# Patient Record
Sex: Female | Born: 1935
Health system: Southern US, Community
[De-identification: ages and names within clinical notes are randomized; demographics above are authoritative.]

## PROBLEM LIST (undated history)

## (undated) DIAGNOSIS — I493 Ventricular premature depolarization: Secondary | ICD-10-CM

## (undated) DIAGNOSIS — M1990 Unspecified osteoarthritis, unspecified site: Secondary | ICD-10-CM

## (undated) DIAGNOSIS — F329 Major depressive disorder, single episode, unspecified: Secondary | ICD-10-CM

## (undated) DIAGNOSIS — T7840XA Allergy, unspecified, initial encounter: Secondary | ICD-10-CM

## (undated) DIAGNOSIS — K219 Gastro-esophageal reflux disease without esophagitis: Secondary | ICD-10-CM

## (undated) DIAGNOSIS — Z972 Presence of dental prosthetic device (complete) (partial): Secondary | ICD-10-CM

## (undated) DIAGNOSIS — F419 Anxiety disorder, unspecified: Secondary | ICD-10-CM

## (undated) DIAGNOSIS — F32A Depression, unspecified: Secondary | ICD-10-CM

## (undated) DIAGNOSIS — S32000A Wedge compression fracture of unspecified lumbar vertebra, initial encounter for closed fracture: Secondary | ICD-10-CM

## (undated) HISTORY — DX: Depression, unspecified: F32.A

## (undated) HISTORY — DX: Gastro-esophageal reflux disease without esophagitis: K21.9

## (undated) HISTORY — DX: Anxiety disorder, unspecified: F41.9

## (undated) HISTORY — DX: Allergy, unspecified, initial encounter: T78.40XA

## (undated) HISTORY — DX: Unspecified osteoarthritis, unspecified site: M19.90

## (undated) HISTORY — DX: Major depressive disorder, single episode, unspecified: F32.9

## (undated) HISTORY — PX: KNEE ARTHROSCOPY: SUR90

## (undated) HISTORY — PX: HEMORRHOID SURGERY: SHX153

---

## 2004-02-16 ENCOUNTER — Encounter: Payer: Self-pay | Admitting: Internal Medicine

## 2004-11-16 LAB — FECAL OCCULT BLOOD, GUAIAC: Fecal Occult Blood: NEGATIVE

## 2004-11-29 ENCOUNTER — Ambulatory Visit: Payer: Self-pay | Admitting: Internal Medicine

## 2005-03-03 ENCOUNTER — Encounter: Payer: Self-pay | Admitting: Internal Medicine

## 2005-12-05 ENCOUNTER — Ambulatory Visit: Payer: Self-pay | Admitting: Internal Medicine

## 2005-12-15 ENCOUNTER — Ambulatory Visit: Payer: Self-pay | Admitting: Internal Medicine

## 2006-03-10 ENCOUNTER — Ambulatory Visit: Payer: Self-pay | Admitting: Internal Medicine

## 2006-03-20 ENCOUNTER — Ambulatory Visit: Payer: Self-pay | Admitting: Internal Medicine

## 2006-03-31 ENCOUNTER — Ambulatory Visit: Payer: Self-pay | Admitting: Internal Medicine

## 2006-04-06 ENCOUNTER — Inpatient Hospital Stay: Payer: Self-pay | Admitting: General Practice

## 2006-04-12 ENCOUNTER — Ambulatory Visit: Payer: Self-pay | Admitting: Internal Medicine

## 2006-04-26 ENCOUNTER — Encounter: Payer: Self-pay | Admitting: General Practice

## 2006-05-01 ENCOUNTER — Ambulatory Visit: Payer: Self-pay | Admitting: Internal Medicine

## 2006-06-16 ENCOUNTER — Ambulatory Visit: Payer: Self-pay | Admitting: Internal Medicine

## 2006-07-11 HISTORY — PX: REPLACEMENT TOTAL KNEE: SUR1224

## 2006-10-24 ENCOUNTER — Encounter: Payer: Self-pay | Admitting: Internal Medicine

## 2006-10-24 DIAGNOSIS — M810 Age-related osteoporosis without current pathological fracture: Secondary | ICD-10-CM

## 2006-10-24 DIAGNOSIS — M150 Primary generalized (osteo)arthritis: Secondary | ICD-10-CM

## 2006-10-24 DIAGNOSIS — K219 Gastro-esophageal reflux disease without esophagitis: Secondary | ICD-10-CM | POA: Insufficient documentation

## 2006-10-24 DIAGNOSIS — G479 Sleep disorder, unspecified: Secondary | ICD-10-CM | POA: Insufficient documentation

## 2006-11-15 ENCOUNTER — Ambulatory Visit: Payer: Self-pay | Admitting: Internal Medicine

## 2006-11-15 DIAGNOSIS — F329 Major depressive disorder, single episode, unspecified: Secondary | ICD-10-CM

## 2006-11-15 DIAGNOSIS — R5383 Other fatigue: Secondary | ICD-10-CM

## 2006-11-15 DIAGNOSIS — R5381 Other malaise: Secondary | ICD-10-CM

## 2006-11-16 ENCOUNTER — Telehealth (INDEPENDENT_AMBULATORY_CARE_PROVIDER_SITE_OTHER): Payer: Self-pay | Admitting: *Deleted

## 2006-11-16 LAB — CONVERTED CEMR LAB
ALT: 14 units/L (ref 0–40)
AST: 21 units/L (ref 0–37)
Albumin: 4.2 g/dL (ref 3.5–5.2)
Alkaline Phosphatase: 71 units/L (ref 39–117)
Basophils Absolute: 0 10*3/uL (ref 0.0–0.1)
CO2: 27 meq/L (ref 19–32)
Chloride: 107 meq/L (ref 96–112)
Creatinine, Ser: 0.7 mg/dL (ref 0.4–1.2)
Eosinophils Absolute: 0.1 10*3/uL (ref 0.0–0.6)
GFR calc non Af Amer: 85 mL/min
Glucose, Bld: 91 mg/dL (ref 70–99)
MCHC: 34.1 g/dL (ref 30.0–36.0)
MCV: 92.8 fL (ref 78.0–100.0)
Platelets: 394 10*3/uL (ref 150–400)
RBC: 4.31 M/uL (ref 3.87–5.11)
RDW: 12.6 % (ref 11.5–14.6)
Sodium: 142 meq/L (ref 135–145)

## 2006-12-12 ENCOUNTER — Encounter (INDEPENDENT_AMBULATORY_CARE_PROVIDER_SITE_OTHER): Payer: Self-pay | Admitting: *Deleted

## 2006-12-13 ENCOUNTER — Ambulatory Visit: Payer: Self-pay | Admitting: Internal Medicine

## 2007-02-09 ENCOUNTER — Ambulatory Visit: Payer: Self-pay | Admitting: Internal Medicine

## 2007-04-16 ENCOUNTER — Telehealth: Payer: Self-pay | Admitting: Internal Medicine

## 2007-05-17 ENCOUNTER — Encounter: Payer: Self-pay | Admitting: Internal Medicine

## 2007-05-17 ENCOUNTER — Ambulatory Visit: Payer: Self-pay | Admitting: Internal Medicine

## 2007-05-18 ENCOUNTER — Encounter (INDEPENDENT_AMBULATORY_CARE_PROVIDER_SITE_OTHER): Payer: Self-pay | Admitting: *Deleted

## 2007-05-18 LAB — HM MAMMOGRAPHY: HM Mammogram: NORMAL

## 2007-06-12 ENCOUNTER — Ambulatory Visit: Payer: Self-pay | Admitting: Internal Medicine

## 2007-07-24 ENCOUNTER — Telehealth (INDEPENDENT_AMBULATORY_CARE_PROVIDER_SITE_OTHER): Payer: Self-pay | Admitting: *Deleted

## 2007-07-31 ENCOUNTER — Telehealth (INDEPENDENT_AMBULATORY_CARE_PROVIDER_SITE_OTHER): Payer: Self-pay | Admitting: *Deleted

## 2007-07-31 ENCOUNTER — Telehealth: Payer: Self-pay | Admitting: Internal Medicine

## 2007-08-06 ENCOUNTER — Ambulatory Visit: Payer: Self-pay | Admitting: Family Medicine

## 2007-08-06 DIAGNOSIS — K5289 Other specified noninfective gastroenteritis and colitis: Secondary | ICD-10-CM | POA: Insufficient documentation

## 2007-10-29 ENCOUNTER — Telehealth (INDEPENDENT_AMBULATORY_CARE_PROVIDER_SITE_OTHER): Payer: Self-pay | Admitting: *Deleted

## 2007-12-13 ENCOUNTER — Telehealth (INDEPENDENT_AMBULATORY_CARE_PROVIDER_SITE_OTHER): Payer: Self-pay | Admitting: *Deleted

## 2007-12-17 ENCOUNTER — Ambulatory Visit: Payer: Self-pay | Admitting: Internal Medicine

## 2008-02-11 ENCOUNTER — Telehealth (INDEPENDENT_AMBULATORY_CARE_PROVIDER_SITE_OTHER): Payer: Self-pay | Admitting: *Deleted

## 2008-03-24 ENCOUNTER — Ambulatory Visit: Payer: Self-pay | Admitting: Internal Medicine

## 2008-03-24 ENCOUNTER — Telehealth (INDEPENDENT_AMBULATORY_CARE_PROVIDER_SITE_OTHER): Payer: Self-pay | Admitting: *Deleted

## 2008-03-24 DIAGNOSIS — J069 Acute upper respiratory infection, unspecified: Secondary | ICD-10-CM | POA: Insufficient documentation

## 2008-03-24 DIAGNOSIS — R413 Other amnesia: Secondary | ICD-10-CM

## 2008-03-25 LAB — CONVERTED CEMR LAB
ALT: 15 units/L (ref 0–35)
AST: 19 units/L (ref 0–37)
Albumin: 4.4 g/dL (ref 3.5–5.2)
Basophils Absolute: 0 10*3/uL (ref 0.0–0.1)
Basophils Relative: 0.7 % (ref 0.0–3.0)
CO2: 30 meq/L (ref 19–32)
Calcium: 9.3 mg/dL (ref 8.4–10.5)
GFR calc non Af Amer: 63 mL/min
HCT: 40.4 % (ref 36.0–46.0)
Hemoglobin: 13.6 g/dL (ref 12.0–15.0)
Lymphocytes Relative: 31.2 % (ref 12.0–46.0)
MCHC: 33.6 g/dL (ref 30.0–36.0)
Monocytes Absolute: 0.7 10*3/uL (ref 0.1–1.0)
Neutro Abs: 4.1 10*3/uL (ref 1.4–7.7)
Phosphorus: 3.9 mg/dL (ref 2.3–4.6)
RBC: 4.24 M/uL (ref 3.87–5.11)
Sodium: 140 meq/L (ref 135–145)
Vitamin B-12: 395 pg/mL (ref 211–911)

## 2008-04-21 ENCOUNTER — Telehealth: Payer: Self-pay | Admitting: Family Medicine

## 2008-05-19 ENCOUNTER — Telehealth (INDEPENDENT_AMBULATORY_CARE_PROVIDER_SITE_OTHER): Payer: Self-pay | Admitting: *Deleted

## 2008-05-22 ENCOUNTER — Telehealth: Payer: Self-pay | Admitting: Internal Medicine

## 2008-05-26 ENCOUNTER — Telehealth (INDEPENDENT_AMBULATORY_CARE_PROVIDER_SITE_OTHER): Payer: Self-pay | Admitting: *Deleted

## 2008-06-18 ENCOUNTER — Ambulatory Visit: Payer: Self-pay | Admitting: Internal Medicine

## 2008-09-01 ENCOUNTER — Telehealth: Payer: Self-pay | Admitting: Internal Medicine

## 2008-09-15 ENCOUNTER — Ambulatory Visit: Payer: Self-pay | Admitting: Internal Medicine

## 2008-09-15 DIAGNOSIS — K4090 Unilateral inguinal hernia, without obstruction or gangrene, not specified as recurrent: Secondary | ICD-10-CM | POA: Insufficient documentation

## 2008-09-15 DIAGNOSIS — J309 Allergic rhinitis, unspecified: Secondary | ICD-10-CM | POA: Insufficient documentation

## 2008-12-24 ENCOUNTER — Ambulatory Visit: Payer: Self-pay | Admitting: Internal Medicine

## 2008-12-29 ENCOUNTER — Telehealth: Payer: Self-pay | Admitting: Internal Medicine

## 2009-01-27 ENCOUNTER — Encounter: Payer: Self-pay | Admitting: Internal Medicine

## 2009-02-08 HISTORY — PX: INGUINAL HERNIA REPAIR: SUR1180

## 2009-02-18 ENCOUNTER — Ambulatory Visit: Payer: Self-pay | Admitting: Surgery

## 2009-02-25 ENCOUNTER — Ambulatory Visit: Payer: Self-pay | Admitting: Surgery

## 2009-03-04 ENCOUNTER — Encounter: Payer: Self-pay | Admitting: Internal Medicine

## 2009-03-18 ENCOUNTER — Ambulatory Visit: Payer: Self-pay | Admitting: Internal Medicine

## 2009-03-18 DIAGNOSIS — N760 Acute vaginitis: Secondary | ICD-10-CM | POA: Insufficient documentation

## 2009-03-18 LAB — CONVERTED CEMR LAB
Protein, U semiquant: NEGATIVE
Urobilinogen, UA: 0.2
WBC Urine, dipstick: NEGATIVE

## 2009-03-31 ENCOUNTER — Telehealth: Payer: Self-pay | Admitting: Internal Medicine

## 2009-04-03 ENCOUNTER — Telehealth: Payer: Self-pay | Admitting: Internal Medicine

## 2009-04-16 ENCOUNTER — Ambulatory Visit: Payer: Self-pay | Admitting: Internal Medicine

## 2009-04-23 ENCOUNTER — Telehealth: Payer: Self-pay | Admitting: Internal Medicine

## 2009-06-08 ENCOUNTER — Telehealth: Payer: Self-pay | Admitting: Internal Medicine

## 2009-08-20 ENCOUNTER — Telehealth: Payer: Self-pay | Admitting: Internal Medicine

## 2009-09-25 ENCOUNTER — Ambulatory Visit: Payer: Self-pay | Admitting: Internal Medicine

## 2009-09-26 LAB — CONVERTED CEMR LAB
BUN: 16 mg/dL (ref 6–23)
Basophils Absolute: 0.1 10*3/uL (ref 0.0–0.1)
CO2: 29 meq/L (ref 19–32)
Calcium: 9.4 mg/dL (ref 8.4–10.5)
Creatinine, Ser: 0.9 mg/dL (ref 0.4–1.2)
Eosinophils Relative: 1.3 % (ref 0.0–5.0)
HCT: 42.7 % (ref 36.0–46.0)
Lymphs Abs: 2.1 10*3/uL (ref 0.7–4.0)
MCV: 96.7 fL (ref 78.0–100.0)
Monocytes Absolute: 0.6 10*3/uL (ref 0.1–1.0)
Platelets: 342 10*3/uL (ref 150.0–400.0)
RDW: 12.9 % (ref 11.5–14.6)
TSH: 1.45 microintl units/mL (ref 0.35–5.50)

## 2009-10-08 ENCOUNTER — Telehealth: Payer: Self-pay | Admitting: Internal Medicine

## 2009-10-13 ENCOUNTER — Telehealth: Payer: Self-pay | Admitting: Internal Medicine

## 2009-11-10 ENCOUNTER — Telehealth: Payer: Self-pay | Admitting: Internal Medicine

## 2010-01-08 ENCOUNTER — Telehealth: Payer: Self-pay | Admitting: Internal Medicine

## 2010-02-11 ENCOUNTER — Ambulatory Visit: Payer: Self-pay | Admitting: Family Medicine

## 2010-03-24 ENCOUNTER — Telehealth: Payer: Self-pay | Admitting: Internal Medicine

## 2010-04-28 ENCOUNTER — Ambulatory Visit: Payer: Self-pay | Admitting: Internal Medicine

## 2010-04-28 DIAGNOSIS — F39 Unspecified mood [affective] disorder: Secondary | ICD-10-CM | POA: Insufficient documentation

## 2010-05-24 ENCOUNTER — Telehealth: Payer: Self-pay | Admitting: Internal Medicine

## 2010-05-27 ENCOUNTER — Telehealth: Payer: Self-pay | Admitting: Internal Medicine

## 2010-07-26 ENCOUNTER — Telehealth: Payer: Self-pay | Admitting: Internal Medicine

## 2010-07-26 ENCOUNTER — Encounter: Payer: Self-pay | Admitting: Internal Medicine

## 2010-08-10 NOTE — Progress Notes (Signed)
Summary: ALPRAZOLAM  Phone Note Refill Request Message from:  Suzie Portela [id number] on October 08, 2009 12:45 PM  Refills Requested: Medication #1:  ALPRAZOLAM 0.25 MG  TABS take 1 po at bedtime as needed to help sleep   Last Refilled: 06/08/2009 E-Scribe Request    Method Requested: Telephone to Pharmacy Initial call taken by: Edwin Dada CMA Deborra Medina),  October 08, 2009 12:45 PM  Follow-up for Phone Call        okay #30 x 1 Follow-up by: Claris Gower MD,  October 08, 2009 1:24 PM  Additional Follow-up for Phone Call Additional follow up Details #1::        Rx called to pharmacy Additional Follow-up by: Edwin Dada CMA Deborra Medina),  October 08, 2009 2:33 PM    New/Updated Medications: ALPRAZOLAM 0.25 MG  TABS (ALPRAZOLAM) take 1 tab  at bedtime as needed to help sleep Prescriptions: ALPRAZOLAM 0.25 MG  TABS (ALPRAZOLAM) take 1 tab  at bedtime as needed to help sleep  #30 x 1   Entered by:   Edwin Dada CMA ([place])   Authorized by:   Claris Gower MD   Signed by:   Edwin Dada CMA ([place]) on 10/08/2009   Method used:   Telephoned to ...       [id number] [place] (retail)       [address], [place]       [place],   [address]       Ph: [phone]       Fax: [phone]   RxID:   [id number]

## 2010-08-10 NOTE — Progress Notes (Signed)
Summary: refill request for xanax  Phone Note Refill Request Message from:  Fax from Pharmacy  Refills Requested: Medication #1:  ALPRAZOLAM 0.25 MG  TABS take 1 tab  at bedtime as needed to help sleep   Last Refilled: 10/08/2009 Faxed request from [address], pt wants 100.  Fax is on your desk.  Initial call taken by: Marty Heck CMA,  Nov 10, 2009 2:10 PM  Follow-up for Phone Call        okay #100 x 0 Follow-up by: Claris Gower MD,  Nov 10, 2009 2:23 PM  Additional Follow-up for Phone Call Additional follow up Details #1::        Rx faxed to pharmacy Additional Follow-up by: DeShannon Smith CMA Deborra Medina),  Nov 10, 2009 5:16 PM    Prescriptions: ALPRAZOLAM 0.25 MG  TABS (ALPRAZOLAM) take 1 tab  at bedtime as needed to help sleep  #100 x 0   Entered by:   Edwin Dada CMA ([place])   Authorized by:   Claris Gower MD   Signed by:   Edwin Dada CMA ([place]) on 11/10/2009   Method used:   Handwritten   [id number] ALPRAZOLAM 0.25 MG  TABS (ALPRAZOLAM) take 1 tab  at bedtime as needed to help sleep  #100 x prn   Entered by:   Edwin Dada CMA ([place])   Authorized by:   Claris Gower MD   Signed by:   Edwin Dada CMA ([place]) on 11/10/2009   Method used:   Handwritten   [id number]

## 2010-08-10 NOTE — Assessment & Plan Note (Signed)
Summary: STOMACH/CLE   Vital Signs:  Patient profile:   77 year old female Weight:      163.38 pounds Temp:     98.3 degrees F oral Pulse rate:   76 / minute Pulse rhythm:   regular BP sitting:   120 / 70  (left arm) Cuff size:   regular  Vitals Entered By: Sherrian Divers CMA Deborra Medina) (February 11, 2010 8:51 AM) CC: stomach issues   History of Present Illness: 77 yo new to me here to discuss stomach issues and anxiety.  1.  Stomach issues- for weeks, she feels she gasy.  Burping a lot, reflux symtpoms at night.  Afraid to take Omeprazole due to warnings she has heard about.  No nausea, vomiting, constipation, or diarrhea.  No abdominal pain, fevers or chills.  2.  Depression/anxiety- took Paxil after her husband died in 26-Aug-2022 but weaned herself off, now finds herself more tearful and anxious.  Very "jumpy."  No SI or HI.  Has a great social life at [facility] but feels on edge.    Current Medications (verified): 1)  Aspirin 81 Mg Tbec (Aspirin) .... Take 1 Tablet By Mouth Once A Day 2)  Prilosec 20 Mg Cpdr (Omeprazole) .... Take 1 Tablet By Mouth Once A Day 3)  Multivitamins  Tabs (Multiple Vitamin) .... Take 1 Tablet By Mouth Once A Day 4)  Glucosamine-Chondroitin  Caps (Glucosamine-Chondroit-Vit C-Mn) .... Take 1 Tablet By Mouth Once A Day 5)  Alprazolam 0.25 Mg  Tabs (Alprazolam) .... Take 1 Tab  At Bedtime As Needed To Help Sleep 6)  Vitamin D 400 Unit  Caps (Cholecalciferol) .... 2 Daily 7)  Calcium 1200 1200-1000 Mg-Unit Chew (Calcium Carbonate-Vit D-Min) .... Take 1 Tablet By Mouth Once Daily 8)  Citrucel  Powd (Methylcellulose (Laxative)) .... Use Daily or As Needed 9)  Paroxetine Hcl 10 Mg Tabs (Paroxetine Hcl) .... One By Mouth Daily  Allergies (verified): No Known Drug Allergies  Past History:  Past Medical History: Last updated: 09/15/2008 GERD Osteoarthritis Osteoporosis Depression Allergic rhinitis  Past Surgical History: Last updated: 03/18/2009 C  csection x 2 Hemorrhoidectomy R knee arthroscopy--1998 L knee arthroscopy---2005 RIH repair---8/10  Dr Rochel Brome  Family History: Last updated: 11-14-2006 Dad died of EtOHism Mom died @ 41 of Parkinsons 1 daughter died young of epilepsy Breast cancer in [place] No colon cancer  Social History: Last updated: 09/25/2009 Retired--various positions Widowed 2011 2 children    Never Smoked Alcohol use----has been taking 2 drinks in evening Regular exercise-yes---Pool, loosening up exercises  Reviewed DNR she has yellow form in house already and she still wants it  Risk Factors: Exercise: yes (Nov 14, 2006)  Risk Factors: Smoking Status: never (11-14-2006)  Review of Systems      See HPI General:  Denies malaise. Eyes:  Denies blurring. GI:  Complains of gas and indigestion; denies abdominal pain, bloody stools, change in bowel habits, constipation, hemorrhoids, loss of appetite, nausea, vomiting, vomiting blood, and yellowish skin color. Psych:  Complains of anxiety and depression; denies sense of great danger, suicidal thoughts/plans, thoughts of violence, unusual visions or sounds, and thoughts /plans of harming others.  Physical Exam  General:  alert and normal appearance.   Abdomen:  soft and non-tender.   Psych:  normally interactive, good eye contact, not anxious appearing, and not depressed appearing.     Impression & Recommendations:  Problem # 1:  GERD (ICD-530.81) Assessment Deteriorated Likely multifactorial.  Stopped taking her Prilosec. Time spent with  patient 25 minutes, more than 50% of this time was spent counseling patient on GERD and anxiety. Discussed trying Tagamet if she wanted to stay away from Omeprazole.  Discussed what the actual risks are so that she is aware and can make an informed decision.  Her updated medication list for this problem includes:    Prilosec 20 Mg Cpdr (Omeprazole) .Marland Kitchen... Take 1 tablet by mouth once a day  Problem # 2:   DEPRESSION (ICD-311) Assessment: Deteriorated Restart Paxil. The following medications were removed from the medication list:    Paroxetine Hcl 10 Mg Tabs (Paroxetine hcl) .Marland Kitchen... 1 at  bedtime Her updated medication list for this problem includes:    Alprazolam 0.25 Mg Tabs (Alprazolam) .Marland Kitchen... Take 1 tab  at bedtime as needed to help sleep    Paroxetine Hcl 10 Mg Tabs (Paroxetine hcl) ..... One by mouth daily  Complete Medication List: 1)  Aspirin 81 Mg Tbec (Aspirin) .... Take 1 tablet by mouth once a day 2)  Prilosec 20 Mg Cpdr (Omeprazole) .... Take 1 tablet by mouth once a day 3)  Multivitamins Tabs (Multiple vitamin) .... Take 1 tablet by mouth once a day 4)  Glucosamine-chondroitin Caps (Glucosamine-chondroit-vit c-mn) .... Take 1 tablet by mouth once a day 5)  Alprazolam 0.25 Mg Tabs (Alprazolam) .... Take 1 tab  at bedtime as needed to help sleep 6)  Vitamin D 400 Unit Caps (Cholecalciferol) .... 2 daily 7)  Calcium 1200 1200-1000 Mg-unit Chew (Calcium carbonate-vit d-min) .... Take 1 tablet by mouth once daily 8)  Citrucel Powd (Methylcellulose (laxative)) .... Use daily or as needed 9)  Paroxetine Hcl 10 Mg Tabs (Paroxetine hcl) .... One by mouth daily  Patient Instructions: 1)  Let's try Tagamet/cimetidine. 2)  Restart Paxil 10 mg daily.  Current Allergies (reviewed today): No known allergies

## 2010-08-10 NOTE — Progress Notes (Signed)
Summary: ALPRAZOLAM  Phone Note Refill Request Message from:  [facility] [id number] on December 29, 2008 8:33 AM  Refills Requested: Medication #1:  ALPRAZOLAM 0.25 MG  TABS 1-2 at bedtime as needed to help sleep   Last Refilled: 08/30/2008 E-Scribe Request    Method Requested: Telephone to Pharmacy Initial call taken by: Edwin Dada CMA,  December 29, 2008 8:33 AM  Follow-up for Phone Call        Refill approved-nurse to complete #100 x 0 Follow-up by: Claris Gower MD,  December 29, 2008 2:00 PM  Additional Follow-up for Phone Call Additional follow up Details #1::        Rx called to pharmacy Additional Follow-up by: Edwin Dada CMA,  December 29, 2008 2:54 PM      Prescriptions: ALPRAZOLAM 0.25 MG  TABS (ALPRAZOLAM) 1-2 at bedtime as needed to help sleep  #100 x 0   Entered by:   Edwin Dada CMA   Authorized by:   Claris Gower MD   Signed by:   Edwin Dada CMA on 12/29/2008   Method used:   Telephoned to ...       [id number] [place] [facility]ail[facility]       [address] [place]       [place], [place]  [address]       Ph: [phone]       Fax: [phone]   RxID:   [id number]

## 2010-08-10 NOTE — Progress Notes (Signed)
Summary: cough and congestion  Phone Note Call from Patient Call back at [facility] Phone [phone]   Caller: Patient Call For: Dr.Kc Sedlak Summary of Call: Pt.has congestion,cough.  Can you fit her in today for an appt.? Initial call taken by: Virgia Land,  August 20, 2009 9:14 AM  Follow-up for Phone Call        okay to add on at end of day Follow-up by: Claris Gower MD,  August 20, 2009 1:46 PM  Additional Follow-up for Phone Call Additional follow up Details #1::        [facility] for pt to call.           Marty Heck CMA  August 20, 2009 2:00 PM  Spoke with pt, she is feeling better, doesnt think that she needs to be seen. Additional Follow-up by: Marty Heck CMA,  August 20, 2009 2:48 PM

## 2010-08-10 NOTE — Progress Notes (Signed)
Summary: regarding xanax  Phone Note From Pharmacy   Caller: [id number] [place] of Call: Xanax was called in for # 60,  pt usually gets # 100, and the pharmacy is asking for that.  Form is on your desk. Initial call taken by: Marty Heck CMA, AAMA,  May 27, 2010 4:49 PM  Follow-up for Phone Call        please change to #100  Follow-up by: Claris Gower MD,  May 27, 2010 5:09 PM  Additional Follow-up for Phone Call Additional follow up Details #1::        [facility] Additional Follow-up by: Edwin Dada CMA Deborra Medina),  May 27, 2010 5:28 PM    Prescriptions: ALPRAZOLAM 0.25 MG  TABS (ALPRAZOLAM) take 1-2 tab  at bedtime as needed to help sleep  #100 x 0   Entered by:   Edwin Dada CMA ([place])   Authorized by:   Claris Gower MD   Signed by:   Edwin Dada CMA ([place]) on 05/27/2010   Method used:   Handwritten   [id number]

## 2010-08-10 NOTE — Progress Notes (Signed)
Summary: ALPRAZOLAM   Phone Note Refill Request Message from:  [facility] [id number] on March 24, 2010 2:53 PM  Refills Requested: Medication #1:  ALPRAZOLAM 0.25 MG  TABS take 1 tab  at bedtime as needed to help sleep   Last Refilled: 01/08/2010 E-Scribe Request    Method Requested: Telephone to Pharmacy Initial call taken by: Edwin Dada CMA Deborra Medina),  March 24, 2010 2:54 PM  Follow-up for Phone Call        okay #100 x 0  please find out how she is taking it and adjust--- she seems to take in daytime sometimes as well from time she needs to refill  Follow-up by: Claris Gower MD,  March 24, 2010 3:07 PM  Additional Follow-up for Phone Call Additional follow up Details #1::        pt states she takes 1-2 tablets a day, it depends on the day. rx called to pharmacy. Additional Follow-up by: Edwin Dada CMA Deborra Medina),  March 24, 2010 3:48 PM    New/Updated Medications: ALPRAZOLAM 0.25 MG  TABS (ALPRAZOLAM) take 1-2 tab  at bedtime as needed to help sleep Prescriptions: ALPRAZOLAM 0.25 MG  TABS (ALPRAZOLAM) take 1 tab  at bedtime as needed to help sleep  #100 x 0   Entered by:   Edwin Dada CMA ([place])   Authorized by:   Claris Gower MD   Signed by:   Edwin Dada CMA ([place]) on 03/24/2010   Method used:   Telephoned to ...       [id number] [place] [facility]ail)       [address] [place]       [place], [place]  [address]       Ph: [phone]       Fax: [phone]   RxID:   [id number]

## 2010-08-10 NOTE — Progress Notes (Signed)
Summary: ALPRAZOLAM  Phone Note Refill Request Message from:  [facility] [id number] on May 24, 2010 10:48 AM  Refills Requested: Medication #1:  ALPRAZOLAM 0.25 MG  TABS take 1-2 tab  at bedtime as needed to help sleep   Last Refilled: 03/24/2010 E-Scribe Request    Method Requested: Telephone to Pharmacy Initial call taken by: Edwin Dada CMA Deborra Medina),  May 24, 2010 10:48 AM  Follow-up for Phone Call        okay #60 x 0 Follow-up by: Claris Gower MD,  May 24, 2010 1:36 PM  Additional Follow-up for Phone Call Additional follow up Details #1::        Rx called to pharmacy Additional Follow-up by: DeShannon Tamala Julian CMA Deborra Medina),  May 24, 2010 2:47 PM    Prescriptions: ALPRAZOLAM 0.25 MG  TABS (ALPRAZOLAM) take 1-2 tab  at bedtime as needed to help sleep  #60 x 0   Entered by:   Edwin Dada CMA ([place])   Authorized by:   Claris Gower MD   Signed by:   Edwin Dada CMA ([place]) on 05/24/2010   Method used:   Telephoned to ...       [id number] [place] [facility]ail[facility]       [address], [place]       [place], [place]  [address]       Ph: [phone]       Fax: [phone]   RxID:   [id number]

## 2010-08-10 NOTE — Progress Notes (Signed)
Summary: pt is angry, requests phone call  Phone Note Call from Patient Call back at Home Phone [phone]   Caller: Patient Call For: Claris Gower MD Summary of Call: Pt is angry that her alprazolam was only renewed for # 30 instead of the 100 that she usually gets.  She wants you to call her, she said she wants to hear this from you. Initial call taken by: Marty Heck CMA,  October 13, 2009 11:47 AM  Follow-up for Phone Call        Please call her I didn't remember that she gets 100 at a time---we had changed the  directions to just one at bedtime and it erased the past refills done.  I am sorry  Okay to refill #100 x 0 now I can still call her if needed Follow-up by: Claris Gower MD,  October 13, 2009 12:35 PM  Additional Follow-up for Phone Call Additional follow up Details #1::        pt is still angry, she talked about not getting her lab results and that she wasn't going back over to [facility] now to get refill, she will call when it's time and then I will call in for #100. She would like to hear from you personally why it was wrong. DeShannon Smith CMA Deborra Medina)  October 13, 2009 12:46 PM   Just wanted me to apologize and I did Additional Follow-up by: Claris Gower MD,  October 13, 2009 2:20 PM

## 2010-08-10 NOTE — Assessment & Plan Note (Signed)
Summary: 6 MTH FU/CLE   Vital Signs:  Patient profile:   77 year old female Height:      66 inches Weight:      162 pounds BMI:     26.24 Temp:     99.3 degrees F oral Pulse rate:   76 / minute Pulse rhythm:   regular BP sitting:   122 / 70  (left arm) Cuff size:   regular  Vitals Entered By: Edwin Dada CMA Deborra Medina) (April 28, 2010 10:56 AM) CC: 6 month follow-up   History of Present Illness: Having another bout of gas for 2 weeks Occurs intermittently  Burping and lots of gas Stopped the prilosec again--not clear that she is any worse back on it--but she does have some acid symptoms taking some tagamet also  does have adequate dairy intake discussed stopping the calcium  Arthritis seems to still be well controlled on the glucosamine chondroitin  Has been back on the paxil some stress still She isn't clear about it helping Still feels that "my head is going all the time" Not depressed  Allergies: No Known Drug Allergies  Past History:  Past medical, surgical, family and social histories (including risk factors) reviewed for relevance to current acute and chronic problems.  Past Medical History: GERD Osteoarthritis Osteoporosis Depression Allergic rhinitis Anxiety  Past Surgical History: Reviewed history from 03/18/2009 and no changes required. C csection x 2 Hemorrhoidectomy R knee arthroscopy--1998 L knee arthroscopy---2005 RIH repair---8/10  Dr Rochel Brome  Family History: Reviewed history from 10/24/2006 and no changes required. Dad died of EtOHism Mom died @ 17 of Parkinsons 40 daughter died young of epilepsy Breast cancer in [place] No colon cancer  Social History: Reviewed history from 09/25/2009 and no changes required. Retired--various positions Widowed 2011 2 children    Never Smoked Alcohol use----has been taking 2 drinks in evening Regular exercise-yes---Pool, loosening up exercises  Reviewed DNR she has yellow form in house  already and she still wants it  Review of Systems       eating okay weight stable still exercises--now doing Tai Chi sleeps well  Physical Exam  General:  alert and normal appearance.   Neck:  supple, no masses, no thyromegaly, no carotid bruits, and no cervical lymphadenopathy.   Lungs:  normal respiratory effort, no intercostal retractions, no accessory muscle use, and normal breath sounds.   Heart:  normal rate, regular rhythm, no murmur, and no gallop.   Abdomen:  soft, non-tender, and normal bowel sounds.   Msk:  no joint tenderness and no joint swelling.   Pulses:  1+ in feet Extremities:  no edema Psych:  normally interactive, good eye contact, not depressed appearing, and slightly anxious---somewhat pressured as usual   Impression & Recommendations:  Problem # 1:  GERD (ICD-530.81) Assessment Comment Only with lots of gassiness gets more acid when off prilosec will have her continue stop the calcium as this can cause symtoms  Her updated medication list for this problem includes:    Prilosec 20 Mg Cpdr (Omeprazole) .Marland Kitchen... Take 1 tablet by mouth once a day  Problem # 2:  ANXIETY (ICD-300.00) Assessment: Comment Only ongoing personality issue needs to continue this  Her updated medication list for this problem includes:    Paroxetine Hcl 10 Mg Tabs (Paroxetine hcl) ..... One by mouth daily    Alprazolam 0.25 Mg Tabs (Alprazolam) .Marland Kitchen... Take 1-2 tab  at bedtime as needed to help sleep  Problem # 3:  OSTEOARTHRITIS (ICD-715.90) Assessment:  Unchanged doing okay without regular meds  Problem # 4:  OSTEOPOROSIS (ICD-733.00) Assessment: Comment Only on vitamin D will use only tums for calcium  The following medications were removed from the medication list:    Calcium 1200 1200-1000 Mg-unit Chew (Calcium carbonate-vit d-min) .Marland Kitchen... Take 1 tablet by mouth once daily Her updated medication list for this problem includes:    Vitamin D 400 Unit Caps (Cholecalciferol)  .Marland Kitchen... 2 daily    Vitamin D 1000 Unit Tabs (Cholecalciferol) .Marland Kitchen... 1 tab daily for vitamin d supplement  Complete Medication List: 1)  Paroxetine Hcl 10 Mg Tabs (Paroxetine hcl) .... One by mouth daily 2)  Prilosec 20 Mg Cpdr (Omeprazole) .... Take 1 tablet by mouth once a day 3)  Alprazolam 0.25 Mg Tabs (Alprazolam) .... Take 1-2 tab  at bedtime as needed to help sleep 4)  Aspirin 81 Mg Tbec (Aspirin) .... Take 1 tablet by mouth once a day 5)  Vitamin D 400 Unit Caps (Cholecalciferol) .... 2 daily 6)  Citrucel Powd (Methylcellulose (laxative)) .... Use daily or as needed 7)  Multivitamins Tabs (Multiple vitamin) .... Take 1 tablet by mouth once a day 8)  Glucosamine-chondroitin Caps (Glucosamine-chondroit-vit c-mn) .... Take 1 tablet by mouth once a day 9)  Vitamin D 1000 Unit Tabs (Cholecalciferol) .Marland Kitchen.. 1 tab daily for vitamin d supplement  Patient Instructions: 1)  Please stop the calcium and just take vitamin D 1000 units daily 2)  Please schedule a follow-up appointment in 6 months for annual wellness visit   Orders Added: 1)  Est. Patient Level IV [id number]    Current Allergies (reviewed today): No known allergies  Appended Document: 6 MTH FU/CLE    Clinical Lists Changes  Orders: Added new Service order of Flu Vaccine 38yrs + MEDICARE PATIENTS [id number]) - Signed Added new Service order of Administration Flu vaccine - MCR [id number]) - Signed Observations: Added new observation of FLU VAX#1VIS: 02/02/10 version given April 28, 2010. (04/28/2010 11:45) Added new observation of FLU VAXLOT: [id number] (04/28/2010 11:45) Added new observation of FLU VAX EXP: 01/08/2011 (04/28/2010 11:45) Added new observation of FLU VAXBY: DeShannon Smith CMA (AAMA) (04/28/2010 11:45) Added new observation of FLU VAXRTE: IM (04/28/2010 11:45) Added new observation of FLU VAX DSE: 0.5 ml (04/28/2010 11:45) Added new observation of FLU VAXMFR: [employer] (04/28/2010 11:45) Added new  observation of FLU VAX SITE: left deltoid (04/28/2010 11:45) Added new observation of FLU VAX: Fluvax MCR (04/28/2010 11:45)       Influenza Vaccine    Vaccine Type: Fluvax MCR    Site: left deltoid    Mfr: [employer]oSmith[employer]line    Dose: 0.5 ml    Route: IM    Given by: Edwin Dada CMA ([place])    Exp. Date: 01/08/2011    Lot #: [id number]    VIS given: 02/02/10 version given April 28, 2010.  Flu Vaccine Consent Questions    Do you have a history of severe allergic reactions to this vaccine? no    Any prior history of allergic reactions to egg and/or gelatin? no    Do you have a sensitivity to the preservative Thimersol? no    Do you have a past history of Guillan-Barre Syndrome? no    Do you currently have an acute febrile illness? no    Have you ever had a severe reaction to latex? no    Vaccine information given and explained to patient? yes    Are you currently pregnant? no

## 2010-08-10 NOTE — Assessment & Plan Note (Signed)
Summary: 15 M F/U DLO   Vital Signs:  Patient profile:   77 year old female Weight:      165 pounds Temp:     98.3 degrees F oral Pulse rate:   72 / minute Pulse rhythm:   regular BP sitting:   140 / 70  (left arm) Cuff size:   regular  Vitals Entered By: Edwin Dada CMA Deborra Medina) (September 25, 2009 11:08 AM)  Serial Vital Signs/Assessments:  Comments: 11:08 AM also checked right arm sitting 142/70 By: Edwin Dada CMA (AAMA)    History of Present Illness: doing fairly well  Husband died recently She has adjusted okay some stress with this but we kept him at the nursing home and he went quicklly No ongoing depression Has kept busy socially---out to roam around in [place] wonders about getting off the paroxetine  Does Tai Chi and stay otherwise active No chest pain No SOB concerned about carotids and chol Pedicurist noted some swelling--she hasn't noted  No sig arthritis pain does have occ hand pain--iburpofen helps  stomach okay no indigestion problems  Allergies: No Known Drug Allergies  Past History:  Past medical, surgical, family and social histories (including risk factors) reviewed for relevance to current acute and chronic problems.  Past Medical History: Reviewed history from 09/15/2008 and no changes required. GERD Osteoarthritis Osteoporosis Depression Allergic rhinitis  Past Surgical History: Reviewed history from 03/18/2009 and no changes required. C csection x 2 Hemorrhoidectomy R knee arthroscopy--1998 L knee arthroscopy---2005 RIH repair---8/10  Dr Rochel Brome  Family History: Reviewed history from 10/24/2006 and no changes required. Dad died of EtOHism Mom died @ 55 of Parkinsons 51 daughter died young of epilepsy Breast cancer in [place] No colon cancer  Social History: Retired--various positions Widowed 2011 2 children    Never Smoked Alcohol use----has been taking 2 drinks in evening Regular exercise-yes---Pool,  loosening up exercises  Reviewed DNR she has yellow form in house already and she still wants it  Review of Systems       appetite has been "too good" weight up 3# sleeps okay  Physical Exam  General:  alert and normal appearance.   Neck:  supple, no masses, no thyromegaly, no carotid bruits, and no cervical lymphadenopathy.   Lungs:  normal respiratory effort and normal breath sounds.   Heart:  normal rate, regular rhythm, and no gallop.   Gr 2/6 aortic systolic murmur Abdomen:  soft and non-tender.   Msk:  no joint tenderness and no joint swelling.   Pulses:  1+ in feet Extremities:  no edema Psych:  normally interactive, good eye contact, not anxious appearing, and not depressed appearing.     Impression & Recommendations:  Problem # 1:  DEPRESSION (ICD-311) Assessment Improved finds less stress since husband's death  will try to wean paroxetine  Her updated medication list for this problem includes:    Paroxetine Hcl 10 Mg Tabs (Paroxetine hcl) .Marland Kitchen... 1 at  bedtime    Alprazolam 0.25 Mg Tabs (Alprazolam) .Marland Kitchen... Take 1 po at bedtime as needed to help sleep  Problem # 2:  GERD (ICD-530.81) Assessment: Unchanged does fine with the med  Her updated medication list for this problem includes:    Prilosec 20 Mg Cpdr (Omeprazole) .Marland Kitchen... Take 1 tablet by mouth once a day  Problem # 3:  OSTEOARTHRITIS (ICD-715.90) Assessment: Unchanged okay with occ meds  Problem # 4:  OSTEOPOROSIS (ICD-733.00) Assessment: Unchanged on approp regimen  Her updated medication list for this  problem includes:    Vitamin D 400 Unit Caps (Cholecalciferol) .Marland Kitchen... 2 daily    Calcium 1200 1200-1000 Mg-unit Chew (Calcium carbonate-vit d-min) .Marland Kitchen... Take 1 tablet by mouth once daily  Orders: TLB-Renal Function Panel (80069-RENAL) TLB-CBC Platelet - w/Differential (85025-CBCD) TLB-TSH (Thyroid Stimulating Hormone) (84443-TSH) Venipuncture IM:[id number])  Complete Medication List: 1)  Paroxetine Hcl 10  Mg Tabs (Paroxetine hcl) .Marland Kitchen.. 1 at  bedtime 2)  Aspirin 81 Mg Tbec (Aspirin) .... Take 1 tablet by mouth once a day 3)  Prilosec 20 Mg Cpdr (Omeprazole) .... Take 1 tablet by mouth once a day 4)  Multivitamins Tabs (Multiple vitamin) .... Take 1 tablet by mouth once a day 5)  Glucosamine-chondroitin Caps (Glucosamine-chondroit-vit c-mn) .... Take 1 tablet by mouth once a day 6)  Alprazolam 0.25 Mg Tabs (Alprazolam) .... Take 1 po at bedtime as needed to help sleep 7)  Vitamin D 400 Unit Caps (Cholecalciferol) .... 2 daily 8)  Calcium 1200 1200-1000 Mg-unit Chew (Calcium carbonate-vit d-min) .... Take 1 tablet by mouth once daily 9)  Citrucel Powd (Methylcellulose (laxative)) .... Use daily or as needed  Patient Instructions: 1)  Please take the paroxetine only 5 days a week for the next 2 weeks. If no problems, cut back to only 3 days per week for another 2 weeks. If no problems, then you can stop it. You may have some mild symptoms for a couple of weeks, then it should settle down. If it doesn't, we probably need to restart the medicine 2)  Please schedule a follow-up appointment in 6 months .   Current Allergies (reviewed today): No known allergies

## 2010-08-10 NOTE — Progress Notes (Signed)
Summary: refill request for alprazolam  Phone Note Refill Request Message from:  [place] Requested: Medication #1:  ALPRAZOLAM 0.25 MG  TABS take 1 tab  at bedtime as needed to help sleep   Last Refilled: 11/10/2009 Electronic request from [facility].  Initial call taken by: Marty Heck CMA,  January 08, 2010 10:01 AM  Follow-up for Phone Call        okay #100 x 0 Follow-up by: Claris Gower MD,  January 08, 2010 10:27 AM  Additional Follow-up for Phone Call Additional follow up Details #1::        Called to [facility]. Additional Follow-up by: Marty Heck CMA,  January 08, 2010 3:26 PM    Prescriptions: ALPRAZOLAM 0.25 MG  TABS (ALPRAZOLAM) take 1 tab  at bedtime as needed to help sleep  #100 x 0   Entered by:   Marty Heck CMA   Authorized by:   Claris Gower MD   Signed by:   Marty Heck CMA on 01/08/2010   Method used:   Telephoned to ...       [id number] [place] (retail)       [address], [place]       [place], [place]  [address]       Ph: [phone]       Fax: [phone]   RxID:   [id number]

## 2010-08-12 NOTE — Letter (Signed)
Summary: Med Clearance/[facility]   Imported By: Bubba Hales 08/03/2010 09:27:35  _____________________________________________________________________  External Attachment:    Type:   Image     Comment:   External Document

## 2010-08-12 NOTE — Progress Notes (Signed)
Summary: ALPRAZOLAM  Phone Note [facility] [id number] on July 26, 2010 8:56 AM  Refills Requested: Medication #1:  ALPRAZOLAM 0.25 MG  TABS take 1-2 tab  at bedtime as needed to help sleep   Last Refilled: 06/06/2010 E-Scribe Request    Method Requested: Telephone to Pharmacy Initial call taken by: Edwin Dada CMA Deborra Medina),  July 26, 2010 8:57 AM  Follow-up for Phone Call        Okay #100 x 0 Follow-up by: Claris Gower MD,  July 26, 2010 1:55 PM  Additional Follow-up for Phone Call Additional follow up Details #1::        Rx called to pharmacy Additional Follow-up by: Edwin Dada CMA Deborra Medina),  July 26, 2010 2:29 PM    Prescriptions: ALPRAZOLAM 0.25 MG  TABS (ALPRAZOLAM) take 1-2 tab  at bedtime as needed to help sleep  #100 x 0   Entered by:   Edwin Dada CMA ([place])   Authorized by:   Claris Gower MD   Signed by:   Edwin Dada CMA ([place]) on 07/26/2010   Method used:   Telephoned to ...       [id number] (retail)       [address] [place]       [place], [place]  [address]       Ph: [phone]       Fax: [phone]   RxID:   [id number]

## 2010-08-14 ENCOUNTER — Encounter: Payer: Self-pay | Admitting: Internal Medicine

## 2010-09-03 ENCOUNTER — Telehealth (INDEPENDENT_AMBULATORY_CARE_PROVIDER_SITE_OTHER): Payer: Self-pay | Admitting: *Deleted

## 2010-09-15 ENCOUNTER — Telehealth: Payer: Self-pay | Admitting: Family Medicine

## 2010-09-21 NOTE — Progress Notes (Signed)
Summary: ALPRAZOLAM  Phone Note Refill Request Message from:  [facility] [id number] on September 15, 2010 8:49 AM  Refills Requested: Medication #1:  ALPRAZOLAM 0.25 MG  TABS take 1-2 tab  at bedtime as needed to help sleep E-Scribe Request   Method Requested: Telephone to Pharmacy Initial call taken by: Edwin Dada CMA Deborra Medina),  September 15, 2010 8:50 AM  Follow-up for Phone Call        px written on EMR for call in  Follow-up by: Allena Earing MD,  September 15, 2010 10:20 AM  Additional Follow-up for Phone Call Additional follow up Details #1::        Medication phoned to [facility] pharmacy as instructed. Ozzie Hoyle LPN  March  7, [id number] PM     Prescriptions: ALPRAZOLAM 0.25 MG  TABS (ALPRAZOLAM) take 1-2 tab  at bedtime as needed to help sleep  #100 x 0   Entered and Authorized by:   Allena Earing MD   Signed by:   Ozzie Hoyle LPN on [id number]   Method used:   Telephoned to ...       [id number] [place] ([facility]ail[facility]       [address], [place]       [place], [place]  [address]       Ph: [phone]       Fax: [phone]   RxID:   [id number]

## 2010-10-01 ENCOUNTER — Ambulatory Visit (INDEPENDENT_AMBULATORY_CARE_PROVIDER_SITE_OTHER): Payer: Medicare Other | Admitting: Internal Medicine

## 2010-10-01 ENCOUNTER — Encounter: Payer: Self-pay | Admitting: Internal Medicine

## 2010-10-01 VITALS — BP 148/69 | HR 69 | Temp 97.9°F | Ht 64.0 in | Wt 163.0 lb

## 2010-10-01 DIAGNOSIS — Z0000 Encounter for general adult medical examination without abnormal findings: Secondary | ICD-10-CM

## 2010-10-01 DIAGNOSIS — F329 Major depressive disorder, single episode, unspecified: Secondary | ICD-10-CM

## 2010-10-01 DIAGNOSIS — M1990 Unspecified osteoarthritis, unspecified site: Secondary | ICD-10-CM

## 2010-10-01 DIAGNOSIS — F3289 Other specified depressive episodes: Secondary | ICD-10-CM

## 2010-10-01 DIAGNOSIS — F411 Generalized anxiety disorder: Secondary | ICD-10-CM

## 2010-10-01 DIAGNOSIS — K219 Gastro-esophageal reflux disease without esophagitis: Secondary | ICD-10-CM

## 2010-10-01 LAB — BASIC METABOLIC PANEL
BUN: 21 mg/dL (ref 6–23)
Calcium: 9.3 mg/dL (ref 8.4–10.5)
GFR: 65.21 mL/min (ref >60.00)
Glucose, Bld: 90 mg/dL (ref 70–99)
Sodium: 140 mEq/L (ref 135–145)

## 2010-10-01 LAB — POCT URINALYSIS DIPSTICK
Bilirubin, UA: NEGATIVE
Blood, UA: NEGATIVE
Nitrite, UA: NEGATIVE
Urobilinogen, UA: 0.2
pH, UA: 6

## 2010-10-01 LAB — CBC WITH DIFFERENTIAL/PLATELET
Basophils Absolute: 0 10*3/uL (ref 0.0–0.1)
Eosinophils Absolute: 0.1 10*3/uL (ref 0.0–0.7)
Hemoglobin: 13.3 g/dL (ref 12.0–15.0)
Lymphocytes Relative: 21.2 % (ref 12.0–46.0)
MCHC: 34 g/dL (ref 30.0–36.0)
Monocytes Absolute: 0.8 10*3/uL (ref 0.1–1.0)
Neutro Abs: 5.2 10*3/uL (ref 1.4–7.7)
RDW: 13.5 % (ref 11.5–14.6)

## 2010-10-01 LAB — HEPATIC FUNCTION PANEL
ALT: 16 U/L (ref 0–35)
AST: 22 U/L (ref 0–37)
Bilirubin, Direct: 0.1 mg/dL (ref 0.0–0.3)
Total Bilirubin: 0.6 mg/dL (ref 0.3–1.2)

## 2010-10-01 NOTE — Progress Notes (Signed)
Subjective:    Patient ID: Bridget Mendez, female    DOB: 05/03/1920, 77 y.o.   MRN: [id number]  HPI Wellness visit Papers reviewed  Vision is fine Hearing aides help her functionally but still ongoing problems No falls or instability Now in wellness class--exercising 4 days per week total Cognitive testing normal  Mood has been generally okay She has noticed that she is snapping some--gets annoyed too easily Daughter has been bugging her some---when she tries to give advice Not depressed Still seeing her gentleman friend--not too serious "he has fallen in love with me and wants to spend all his time with me" Sleeping well  Ongoing acid problem Seems better with less citrus Still on the omeprazole  Arthritis pain has been better Still on the glucosamine  Past Medical History  Diagnosis Date  . GERD (gastroesophageal reflux disease)   . Osteoarthritis   . Osteoporosis   . Depression   . Allergy   . Anxiety     Past Surgical History  Procedure Date  . Cesarean section     x2  . Hemorrhoid surgery   . Knee arthroscopy 1998 & 2005    both knees  . Inguinal hernia repair 02/2009    Dr.Wilton Tamala Julian    Family History  Problem Relation Age of Onset  . Parkinsonism Mother   . Cancer Maternal Grandmother     History   Social History  . Marital Status: Widowed    Spouse Name: N/A    Number of Children: 2  . Years of Education: N/A   Occupational History  . retired    Social History Main Topics  . Smoking status: Former Smoker -- 1.0 packs/day    Types: Cigarettes    Quit date: 07/11/1988  . Smokeless tobacco: Not on file  . Alcohol Use: Yes     2 drinks in the evening  . Drug Use: Not on file  . Sexually Active: Not on file   Other Topics Concern  . Not on file   Social History Narrative  . No narrative on file      Review of Systems Appetite is great but weight down slightly Bowels okay on citrucel No bladder problems Discussed  DEXA--she remains on vitamin D and is physically active    Objective:   Physical Exam  Constitutional: She is oriented to person, place, and time. She appears well-developed and well-nourished. No distress.  Neck: Normal range of motion. Neck supple. No thyromegaly present.  Cardiovascular: Normal rate, regular rhythm, normal heart sounds and intact distal pulses.  Exam reveals no gallop.   No murmur heard. Pulmonary/Chest: Effort normal and breath sounds normal. No respiratory distress. She has no wheezes. She has no rales.  Abdominal: Soft. She exhibits no mass. There is no tenderness.  Musculoskeletal: Normal range of motion. She exhibits no edema and no tenderness.  Lymphadenopathy:    She has no cervical adenopathy.  Neurological: She is alert and oriented to person, place, and time. No cranial nerve deficit.       Normal gait and balance  Skin: Skin is warm and dry. No rash noted. No erythema.  Psychiatric: She has a normal mood and affect. Judgment and thought content normal.          Assessment & Plan:  Wellness exam  I have personally reviewed the Medicare Annual Wellness questionnaire and have noted 1. The patient's medical and social history 2. Their use of alcohol, tobacco or illicit drugs 3. Their  current medications and supplements 4. The patient's functional ability including ADL's, fall risks, home safety risks and hearing or visual             impairment. 5. Diet and physical activities 6. Evidence for depression or mood disorders  The patients weight, height, BMI and visual acuity have been recorded in the chart I have made referrals, counseling and provided education to the patient based review of the above and I have provided the pt with a written personalized care plan for preventive services.  I have provided you with a copy of your personalized plan for preventive services. Please take the time to review along with your updated medication list.

## 2010-10-01 NOTE — Progress Notes (Signed)
Addended by: Gaetano Net on: 10/01/2010 11:11 AM   Modules accepted: Orders

## 2010-10-04 NOTE — Progress Notes (Signed)
Quick Note:  Spoke with patient and advised results. ______

## 2010-10-07 NOTE — Progress Notes (Signed)
Summary: Billing Question  Phone Note Call from Patient   Reason for Call: Insurance Question Summary of Call: Received flu shot and has Medicare and she is being billed $23.95 and is questioning bill.  She has called billing and they did not explain and told her to "wait for next bill" .  Can you please review and let me know if this was in error on charge side?  Will you let me know please?    Thanks, Bridget Mendez  Initial call taken by: Vaughan Sine,  September 03, 2010 1:58 PM    Additional Follow-up for Phone Call Additional follow up Details #2::    Email to Alla German with billing re: questions.  She is following up on patient's account.  Follow-up by: Vaughan Sine,  September 06, 2010 4:55 PM

## 2010-10-28 ENCOUNTER — Telehealth: Payer: Self-pay | Admitting: *Deleted

## 2010-10-28 NOTE — Telephone Encounter (Signed)
I saw her today at [facility]. Had stomach bug ~2 weeks ago and not back to normal yet. Nothing worrisome on history Abd benign Will defer appt unless not back to her normal in the next 1-2 weeks

## 2010-10-28 NOTE — Telephone Encounter (Addendum)
Patient had a flare up with her stomach over a week ago with vomiting and shakes. Patient states that her stomach is still unsettled and everything that she eats does not agree with her. Patient states that her stomach is uncomfortable and that she has a lot of gas. Patient wants to see you as soon as possible or have you recommend what she should do?  No appointments available today. Patient is aware that Dr. Silvio Pate is out of the office until this afternoon.  Pharmacy-[facility], [address], [employer].

## 2010-10-28 NOTE — Telephone Encounter (Signed)
Left message at pt's home number to have her return my call, we can add her on Friday 10/29/10 @3 :15.

## 2010-11-03 ENCOUNTER — Telehealth: Payer: Self-pay | Admitting: *Deleted

## 2010-11-03 NOTE — Telephone Encounter (Signed)
Really needs appt Set up for next week or can see another doctor this week (Dr Deborra Medina?)

## 2010-11-03 NOTE — Telephone Encounter (Signed)
Patient says that she is still having issues with her stomach. She says that she discussed this with at [facility]. She says that everything she eats causes her to have gas. She has lost 8 lbs. Because she is not eating. She is asking if she can be seen or if you could call her to discuss. Please advise.

## 2010-11-04 NOTE — Telephone Encounter (Signed)
Left message on machine, asking pt to call for an appt next week with Dr.Letvak or schedule this week with another physician, advised pt to call for appt.

## 2010-11-06 ENCOUNTER — Other Ambulatory Visit: Payer: Self-pay | Admitting: Internal Medicine

## 2010-11-08 ENCOUNTER — Encounter: Payer: Self-pay | Admitting: Internal Medicine

## 2010-11-08 ENCOUNTER — Ambulatory Visit (INDEPENDENT_AMBULATORY_CARE_PROVIDER_SITE_OTHER): Payer: Medicare Other | Admitting: Internal Medicine

## 2010-11-08 VITALS — BP 120/60 | HR 88 | Temp 98.1°F | Ht 64.0 in | Wt 160.0 lb

## 2010-11-08 DIAGNOSIS — F3289 Other specified depressive episodes: Secondary | ICD-10-CM

## 2010-11-08 DIAGNOSIS — F329 Major depressive disorder, single episode, unspecified: Secondary | ICD-10-CM

## 2010-11-08 DIAGNOSIS — F411 Generalized anxiety disorder: Secondary | ICD-10-CM

## 2010-11-08 DIAGNOSIS — R1084 Generalized abdominal pain: Secondary | ICD-10-CM

## 2010-11-08 NOTE — Progress Notes (Signed)
Subjective:    Patient ID: Bridget Mendez, female    DOB: 1919-08-19, 77 y.o.   MRN: [id number]  HPI Started with stomach troubles for about 3 weeks Has 2 drinks every night Occ this would make her sick and she would throwup Has stopped the alcohol 3 weeks ago --but still burping and gassy Feels the alcohol is just a habit  Episode after fish and asparagus and potato Felt sick---nausea. She induced vomiting but still felt bad Had 45 minutes of shakes Xanax then helped Wiped out the next day Has been on beano Stopped the omeprazole about 2 weeks ago after speaking to me when I saw her at health care  Has had sensitive stomach since she was a small girl Had EGD in 2005 due to this---no findings Lots of chocolate--M&Ms "With what I have done to myself, it is no wonder that my stomach is torn up"  Will feel "funny" every few hours---better after eating Has been eating out more since husband died 46 months ago  "I'm not happy" Tries to do things for enjoyment---but has had losses of friends and relatives of late Has still been doing some exercise--tai chi and working with Doctor, general practice Feels she overdoes it at times and has had to stop  Current outpatient prescriptions:ALPRAZolam (XANAX) 0.25 MG tablet, Take 0.25 mg by mouth at bedtime as needed.  , Disp: , Rfl: ;  aspirin 81 MG tablet, Take 81 mg by mouth daily.  , Disp: , Rfl: ;  Cholecalciferol (VITAMIN D) 1000 UNITS capsule, Take 1,000 Units by mouth daily.  , Disp: , Rfl: ;  glucosamine-chondroitin 500-400 MG tablet, Take 1 tablet by mouth daily.  , Disp: , Rfl:  methylcellulose packet, Take by mouth as needed.  , Disp: , Rfl: ;  Multiple Vitamin (MULTIVITAMIN) tablet, Take 1 tablet by mouth daily.  , Disp: , Rfl: ;  omeprazole (PRILOSEC) 20 MG capsule, Take 20 mg by mouth daily.  , Disp: , Rfl: ;  PARoxetine (PAXIL) 10 MG tablet, Take 10 mg by mouth every morning.  , Disp: , Rfl:   Past Medical History  Diagnosis Date  .  GERD (gastroesophageal reflux disease)   . Osteoarthritis   . Osteoporosis   . Depression   . Allergy   . Anxiety     Past Surgical History  Procedure Date  . Cesarean section     x2  . Hemorrhoid surgery   . Knee arthroscopy 1998 & 2005    both knees  . Inguinal hernia repair 02/2009    Dr.Wilton Tamala Julian    Family History  Problem Relation Age of Onset  . Parkinsonism Mother   . Cancer Maternal Grandmother     History   Social History  . Marital Status: Widowed    Spouse Name: N/A    Number of Children: 2  . Years of Education: N/A   Occupational History  . retired    Social History Main Topics  . Smoking status: Former Smoker -- 1.0 packs/day    Types: Cigarettes    Quit date: 07/11/1988  . Smokeless tobacco: Not on file  . Alcohol Use: Yes     2 drinks in the evening  . Drug Use: Not on file  . Sexually Active: Not on file   Other Topics Concern  . Not on file   Social History Narrative  . No narrative on file   Review of Systems Has lost 7# during this illness Bowels are okay  using citrucel daily    Objective:   Physical Exam  Constitutional: She appears well-developed and well-nourished. No distress.  Neck: Normal range of motion. Neck supple. No thyromegaly present.  Cardiovascular: Normal rate, regular rhythm, normal heart sounds and intact distal pulses.  Exam reveals no gallop.   No murmur heard. Pulmonary/Chest: Effort normal and breath sounds normal. No respiratory distress. She has no wheezes. She has no rales.  Abdominal: Soft. She exhibits no distension and no mass. There is no tenderness. There is no rebound and no guarding.       BS slightly overactive  Musculoskeletal: She exhibits no edema and no tenderness.  Lymphadenopathy:    She has no cervical adenopathy.  Psychiatric: Judgment and thought content normal.       Very anxious Pressured speech Not clearly depressed          Assessment & Plan:

## 2010-11-08 NOTE — Patient Instructions (Signed)
Please increase the paroxetine to 20mg  daily (2 of the 10mg )

## 2010-11-08 NOTE — Telephone Encounter (Signed)
rx called to pharmacy 

## 2010-11-08 NOTE — Telephone Encounter (Signed)
Please change the sig to 1 po bid prn for nerves AND 1-2 at bedtime prn to help sleep Okay #100 x 0

## 2010-12-07 ENCOUNTER — Other Ambulatory Visit: Payer: Self-pay | Admitting: *Deleted

## 2010-12-07 MED ORDER — PAROXETINE HCL 20 MG PO TABS: 20.0000 mg | ORAL_TABLET | ORAL | Status: DC

## 2010-12-12 ENCOUNTER — Other Ambulatory Visit: Payer: Self-pay | Admitting: Internal Medicine

## 2010-12-13 ENCOUNTER — Encounter: Payer: Self-pay | Admitting: Internal Medicine

## 2010-12-13 ENCOUNTER — Ambulatory Visit (INDEPENDENT_AMBULATORY_CARE_PROVIDER_SITE_OTHER): Payer: Medicare Other | Admitting: Internal Medicine

## 2010-12-13 VITALS — BP 138/80 | HR 75 | Temp 98.5°F | Ht 66.0 in | Wt 159.0 lb

## 2010-12-13 DIAGNOSIS — F411 Generalized anxiety disorder: Secondary | ICD-10-CM

## 2010-12-13 DIAGNOSIS — K589 Irritable bowel syndrome without diarrhea: Secondary | ICD-10-CM

## 2010-12-13 MED ORDER — ALPRAZOLAM 0.25 MG PO TABS: ORAL_TABLET | ORAL | Status: DC

## 2010-12-13 NOTE — Telephone Encounter (Signed)
Done with office visit

## 2010-12-13 NOTE — Assessment & Plan Note (Signed)
Better on increased paroxetine May be better due to ending relationship that was giving her stress

## 2010-12-13 NOTE — Progress Notes (Signed)
  Subjective:    Patient ID: Bridget Mendez, female    DOB: 01/18/1920, 77 y.o.   MRN: [id number]  HPI "I don't know from day to day" Stomach still acts up at times---esp if has big meals Still burps some No pain Bowels regular on citrucel Stomach seems better if she eats a small amount inbetween meals  Has been on higher dose of paroxetine "I'm fine on it.. Not as busy" Doesn't feel depressed now No longer involved with man---"he was driving me nuts"  Current outpatient prescriptions:ALPRAZolam (XANAX) 0.25 MG tablet, 1 tab by mouth twice daily as needed for nerves AND 1-2 at bedtime as needed to help sleep, Disp: 100 tablet, Rfl: 0;  aspirin 81 MG tablet, Take 81 mg by mouth daily.  , Disp: , Rfl: ;  Calcium Carb-Cholecalciferol (CALCIUM 1000 + D PO), Take by mouth daily.  , Disp: , Rfl: ;  Cholecalciferol (VITAMIN D) 1000 UNITS capsule, Take 1,000 Units by mouth daily.  , Disp: , Rfl:  glucosamine-chondroitin 500-400 MG tablet, Take 1 tablet by mouth daily.  , Disp: , Rfl: ;  methylcellulose packet, Take by mouth as needed.  , Disp: , Rfl: ;  Multiple Vitamin (MULTIVITAMIN) tablet, Take 1 tablet by mouth daily.  , Disp: , Rfl: ;  PARoxetine (PAXIL) 20 MG tablet, Take 1 tablet (20 mg total) by mouth every morning., Disp: 30 tablet, Rfl: 11 DISCONTD: omeprazole (PRILOSEC) 20 MG capsule, Take 20 mg by mouth daily as needed. For heartburn symptoms, Disp: , Rfl:   Past Medical History  Diagnosis Date  . GERD (gastroesophageal reflux disease)   . Osteoarthritis   . Osteoporosis   . Depression   . Allergy   . Anxiety     Past Surgical History  Procedure Date  . Cesarean section     x2  . Hemorrhoid surgery   . Knee arthroscopy 1998 & 2005    both knees  . Inguinal hernia repair 02/2009    Dr.Wilton Tamala Julian    Family History  Problem Relation Age of Onset  . Parkinsonism Mother   . Cancer Maternal Grandmother     History   Social History  . Marital Status: Widowed   Spouse Name: N/A    Number of Children: 2  . Years of Education: N/A   Occupational History  . retired    Social History Main Topics  . Smoking status: Former Smoker -- 1.0 packs/day    Types: Cigarettes    Quit date: 07/11/1988  . Smokeless tobacco: Not on file  . Alcohol Use: Yes     2 drinks in the evening  . Drug Use: Not on file  . Sexually Active: Not on file   Other Topics Concern  . Not on file   Social History Narrative  . No narrative on file   Review of Systems Trying to eat more sensibly Weight down 5# by being careful Sleeps okay   Objective:   Physical Exam  Constitutional: She appears well-developed and well-nourished. No distress.  Abdominal: Soft. She exhibits no mass. There is no tenderness.  Psychiatric: She has a normal mood and affect. Her behavior is normal. Judgment and thought content normal.       Slightly pressured but seems to be at her baseline          Assessment & Plan:

## 2010-12-13 NOTE — Telephone Encounter (Signed)
Okay to phone in #100 of her xanax

## 2010-12-13 NOTE — Assessment & Plan Note (Signed)
Always with lots of gas No real pain and bowels fine on citrucel

## 2011-01-03 ENCOUNTER — Other Ambulatory Visit: Payer: Self-pay | Admitting: *Deleted

## 2011-01-03 MED ORDER — PAROXETINE HCL 20 MG PO TABS: 20.0000 mg | ORAL_TABLET | ORAL | Status: DC

## 2011-01-03 NOTE — Telephone Encounter (Signed)
90 day Rx sent in electronically

## 2011-01-03 NOTE — Telephone Encounter (Signed)
Received fax from pharmacy stating that patient has 11 refills on the Paxil Rx but patient is requesting #90 tablets at a time.  Please advise.  Fax in your IN box.

## 2011-02-11 ENCOUNTER — Other Ambulatory Visit: Payer: Self-pay | Admitting: Internal Medicine

## 2011-02-14 NOTE — Telephone Encounter (Signed)
Okay #100 x 0

## 2011-02-15 NOTE — Telephone Encounter (Signed)
rx called into pharmacy

## 2011-03-31 ENCOUNTER — Other Ambulatory Visit: Payer: Self-pay | Admitting: Internal Medicine

## 2011-03-31 NOTE — Telephone Encounter (Signed)
rx called into pharmacy

## 2011-03-31 NOTE — Telephone Encounter (Signed)
Okay #100 x 0

## 2011-04-08 ENCOUNTER — Encounter: Payer: Self-pay | Admitting: Internal Medicine

## 2011-04-08 ENCOUNTER — Ambulatory Visit (INDEPENDENT_AMBULATORY_CARE_PROVIDER_SITE_OTHER): Payer: Medicare Other | Admitting: Internal Medicine

## 2011-04-08 VITALS — BP 142/79 | HR 79 | Temp 97.6°F | Ht 66.0 in | Wt 160.2 lb

## 2011-04-08 DIAGNOSIS — K589 Irritable bowel syndrome without diarrhea: Secondary | ICD-10-CM

## 2011-04-08 DIAGNOSIS — G479 Sleep disorder, unspecified: Secondary | ICD-10-CM

## 2011-04-08 DIAGNOSIS — F329 Major depressive disorder, single episode, unspecified: Secondary | ICD-10-CM

## 2011-04-08 DIAGNOSIS — F411 Generalized anxiety disorder: Secondary | ICD-10-CM

## 2011-04-08 NOTE — Assessment & Plan Note (Signed)
Better Still uses the alprazolam to help sleep Needs to continue the paroxetine

## 2011-04-08 NOTE — Assessment & Plan Note (Signed)
Doing well with the xanax

## 2011-04-08 NOTE — Assessment & Plan Note (Signed)
Mood has been good Paroxetine for this also

## 2011-04-08 NOTE — Progress Notes (Signed)
Subjective:    Patient ID: Bridget Mendez, female    DOB: 05-Oct-1919, 77 y.o.   MRN: [id number]  HPI Doing well Still with stomach upset---more gas in past 2 weeks Still burping, gas Sensitive if she eats the wrong food Bowels fairly normal---occ constipation (uses MOM) Continues on the citrucel  Anxiety has been controlled Keeps active socially---luncheons, tai chi, exercise classes No depression or anhedonia  No chest pain  No SOB No edema  Current Outpatient Prescriptions on File Prior to Visit  Medication Sig Dispense Refill  . ALPRAZolam (XANAX) 0.25 MG tablet TAKE ONE TABLET BY MOUTH TWICE DAILY AS NEEDED FOR NERVES AND THEN ONE TO TWO AT BEDTIME AS NEEDED FOR SLEEP  100 tablet  0  . aspirin 81 MG tablet Take 81 mg by mouth daily.        . Calcium Carb-Cholecalciferol (CALCIUM 1000 + D PO) Take by mouth daily.        . Cholecalciferol (VITAMIN D) 1000 UNITS capsule Take 1,000 Units by mouth daily.        Marland Kitchen glucosamine-chondroitin 500-400 MG tablet Take 1 tablet by mouth daily.        . methylcellulose packet Take by mouth as needed.        . Multiple Vitamin (MULTIVITAMIN) tablet Take 1 tablet by mouth daily.        Marland Kitchen PARoxetine (PAXIL) 20 MG tablet Take 1 tablet (20 mg total) by mouth every morning.  90 tablet  3    No Known Allergies  Past Medical History  Diagnosis Date  . GERD (gastroesophageal reflux disease)   . Osteoarthritis   . Osteoporosis   . Depression   . Allergy   . Anxiety     Past Surgical History  Procedure Date  . Cesarean section     x2  . Hemorrhoid surgery   . Knee arthroscopy 1998 & 2005    both knees  . Inguinal hernia repair 02/2009    Dr.Wilton Tamala Julian    Family History  Problem Relation Age of Onset  . Parkinsonism Mother   . Cancer Maternal Grandmother     History   Social History  . Marital Status: Widowed    Spouse Name: N/A    Number of Children: 2  . Years of Education: N/A   Occupational History  . retired      Social History Main Topics  . Smoking status: Former Smoker -- 1.0 packs/day    Types: Cigarettes    Quit date: 07/11/1988  . Smokeless tobacco: Not on file  . Alcohol Use: Yes     2 drinks in the evening  . Drug Use: Not on file  . Sexually Active: Not on file   Other Topics Concern  . Not on file   Social History Narrative  . No narrative on file   Review of Systems Appetite is fine Weight is stable Sleeps fine Rare heartburn     Objective:   Physical Exam  Constitutional: She appears well-developed and well-nourished. No distress.  Neck: Normal range of motion. Neck supple. No thyromegaly present.  Cardiovascular: Normal rate, regular rhythm, normal heart sounds and intact distal pulses.  Exam reveals no gallop.   No murmur heard. Pulmonary/Chest: Effort normal and breath sounds normal. No respiratory distress. She has no wheezes. She has no rales.  Abdominal: Soft. Bowel sounds are normal. There is no tenderness.  Musculoskeletal: Normal range of motion. She exhibits no edema and no tenderness.  Lymphadenopathy:  She has no cervical adenopathy.  Psychiatric: She has a normal mood and affect. Her behavior is normal. Judgment and thought content normal.          Assessment & Plan:

## 2011-04-08 NOTE — Assessment & Plan Note (Signed)
Ongoing gas but not much pain Takes fiber

## 2011-05-19 ENCOUNTER — Telehealth: Payer: Self-pay | Admitting: Internal Medicine

## 2011-05-19 NOTE — Telephone Encounter (Signed)
Please call patient regarding concerns with wheezing, cough, concerns about being sick over weekend.  Call back is [phone].

## 2011-05-19 NOTE — Telephone Encounter (Signed)
Spoke with pt, appt made for tomorrow.

## 2011-05-20 ENCOUNTER — Encounter: Payer: Self-pay | Admitting: Internal Medicine

## 2011-05-20 ENCOUNTER — Ambulatory Visit (INDEPENDENT_AMBULATORY_CARE_PROVIDER_SITE_OTHER): Payer: Medicare Other | Admitting: Internal Medicine

## 2011-05-20 DIAGNOSIS — J069 Acute upper respiratory infection, unspecified: Secondary | ICD-10-CM | POA: Insufficient documentation

## 2011-05-20 NOTE — Assessment & Plan Note (Signed)
Reassured that this seems to be just viral Discussed supportive care

## 2011-05-20 NOTE — Progress Notes (Signed)
Subjective:    Patient ID: Bridget Mendez, female    DOB: 1920-04-16, 77 y.o.   MRN: [id number]  HPI Has had a cold --she noted it after the flu shot 10/31 Symptoms within a couple of days (also has been visiting in health care building)  Clear rhinorrhea, just a little cough No ear pain No sore throat Now feels it in her chest some---worried about pnuemonia  No SOB No fever  Current Outpatient Prescriptions on File Prior to Visit  Medication Sig Dispense Refill  . ALPRAZolam (XANAX) 0.25 MG tablet TAKE ONE TABLET BY MOUTH TWICE DAILY AS NEEDED FOR NERVES AND THEN ONE TO TWO AT BEDTIME AS NEEDED FOR SLEEP  100 tablet  0  . aspirin 81 MG tablet Take 81 mg by mouth daily.        . Calcium Carb-Cholecalciferol (CALCIUM 1000 + D PO) Take by mouth daily.        . Cholecalciferol (VITAMIN D) 1000 UNITS capsule Take 1,000 Units by mouth daily.        Marland Kitchen glucosamine-chondroitin 500-400 MG tablet Take 1 tablet by mouth daily.        . methylcellulose packet Take by mouth as needed.        . Multiple Vitamin (MULTIVITAMIN) tablet Take 1 tablet by mouth daily.        Marland Kitchen PARoxetine (PAXIL) 20 MG tablet Take 1 tablet (20 mg total) by mouth every morning.  90 tablet  3    No Known Allergies  Past Medical History  Diagnosis Date  . GERD (gastroesophageal reflux disease)   . Osteoarthritis   . Osteoporosis   . Depression   . Allergy   . Anxiety     Past Surgical History  Procedure Date  . Cesarean section     x2  . Hemorrhoid surgery   . Knee arthroscopy 1998 & 2005    both knees  . Inguinal hernia repair 02/2009    Dr.Wilton Tamala Julian    Family History  Problem Relation Age of Onset  . Parkinsonism Mother   . Cancer Maternal Grandmother     History   Social History  . Marital Status: Widowed    Spouse Name: N/A    Number of Children: 2  . Years of Education: N/A   Occupational History  . retired    Social History Main Topics  . Smoking status: Former Smoker -- 1.0  packs/day    Types: Cigarettes    Quit date: 07/11/1988  . Smokeless tobacco: Never Used  . Alcohol Use: Yes     2 drinks in the evening  . Drug Use: Not on file  . Sexually Active: Not on file   Other Topics Concern  . Not on file   Social History Narrative  . No narrative on file   Review of Systems No GI symptoms like nausea or vomiting Appetite is good     Objective:   Physical Exam  Constitutional: She appears well-developed and well-nourished. No distress.  HENT:  Head: Normocephalic and atraumatic.  Right Ear: External ear normal.  Left Ear: External ear normal.  Mouth/Throat: Oropharynx is clear and moist. No oropharyngeal exudate.       No sinus tenderness Mild nasal congestion only  Neck: Normal range of motion. Neck supple.  Pulmonary/Chest: Effort normal and breath sounds normal. No respiratory distress. She has no wheezes. She has no rales.  Lymphadenopathy:    She has no cervical adenopathy.  Assessment & Plan:

## 2011-05-21 ENCOUNTER — Other Ambulatory Visit: Payer: Self-pay | Admitting: Internal Medicine

## 2011-05-23 NOTE — Telephone Encounter (Signed)
rx called into pharmacy

## 2011-05-23 NOTE — Telephone Encounter (Signed)
Okay #100 x 0

## 2011-06-15 ENCOUNTER — Ambulatory Visit: Payer: Medicare Other | Admitting: Internal Medicine

## 2011-07-10 ENCOUNTER — Other Ambulatory Visit: Payer: Self-pay | Admitting: Internal Medicine

## 2011-07-11 NOTE — Telephone Encounter (Signed)
rx called into pharmacy

## 2011-07-11 NOTE — Telephone Encounter (Signed)
Okay #100 x 0

## 2011-08-16 ENCOUNTER — Other Ambulatory Visit: Payer: Self-pay | Admitting: Internal Medicine

## 2011-08-16 NOTE — Telephone Encounter (Signed)
Spoke with patient about Xanax and she states she only takes 2 at night, I read the sig to her and she states she never takes this during the day, she does use the paxil along with the xanax. I advised I needed to change the directions and she stated she likes to get 100 so she won't be running over to the pharmacy, she states that she doesn't really need the refill right now, she will call back when she gets low.  Pt would like to start taking 1 at night but doesn't know how.please advise  ( should I change the directions?)

## 2011-08-16 NOTE — Telephone Encounter (Signed)
Please call She seems to be using more xanax lately This med is associated with memory problems and increased risk of falls. She should really try to cut down and use less (like only occ during the day and see if 1 is enough at night)  Okay #100 x 0

## 2011-08-17 ENCOUNTER — Telehealth: Payer: Self-pay | Admitting: Internal Medicine

## 2011-08-17 NOTE — Telephone Encounter (Signed)
Will discuss the issues with her then

## 2011-08-17 NOTE — Telephone Encounter (Signed)
If she pays cash, we can change it to 1-2 at bedtime as needed  She can just try one, and then take the second one only later if she is unable to sleep My concern was that it was only just over a month and she was requesting another 100

## 2011-08-17 NOTE — Telephone Encounter (Signed)
Triage Record Num: [id number] Operator: Rosie Fate Patient Name: Bridget Mendez Call Date & Time: 08/17/2011 12:17:20PM Patient Phone: [phone] PCP: Viviana Simpler Patient Gender: Female PCP Fax : [phone] Patient DOB: 12/21/1919 Practice Name: [facility] Reason for Call: Caller: Kelyn/Patient is calling with a question about Alprazolam.The medication was written by Viviana Simpler I. Pt realized she had a full bottle of Alprazolam from order in December per mail order. Questioning about several symptoms, "jumping at noises, occasional confusion, hot flashes." Thinks it is due to her Alprazolam and Paxil and did talk to office nurse on 02/05, who is to call her back about weaning dosage. She has appt for physical in April, but is requesting an appt with Dr. Silvio Pate on 02/07, appt scheduled 02/07 at 1330 with Dr. Silvio Pate per Roselyn Reef in office. (Only slots for CAN to use are Acute Appts, none available.) Protocol(s) Used: Information Only Calls, No Triage Recommended Outcome per Protocol: Provide Information or Advice Only Reason for Outcome: Requesting regular office appointment (Advise to call office when open.) Care Advice: ~ 08/17/2011 12:35:49PM Page 1 of 1 [email or web address]

## 2011-08-17 NOTE — Telephone Encounter (Signed)
Spoke with patient and she scheduled appt 08/18/11 to discuss with Dr.Letvak

## 2011-08-18 ENCOUNTER — Ambulatory Visit (INDEPENDENT_AMBULATORY_CARE_PROVIDER_SITE_OTHER): Payer: Medicare Other | Admitting: Internal Medicine

## 2011-08-18 ENCOUNTER — Encounter: Payer: Self-pay | Admitting: Internal Medicine

## 2011-08-18 DIAGNOSIS — F411 Generalized anxiety disorder: Secondary | ICD-10-CM | POA: Diagnosis not present

## 2011-08-18 NOTE — Patient Instructions (Signed)
Please decrease xanax to just 1 at bedtime now. If you still can't sleep after 1-2 hours, it would be okay to take the second one if needed In March, if you have been able to take only 1 nightly, you can try skipping nights (like don't take it on Monday and Thursday nights). If you have trouble sleeping, it would be safer for you to try melatonin 3 or 5 mg at bedtime instead.  You may continue to reduce the nights you regularly use xanax and hopefully get off completely

## 2011-08-18 NOTE — Progress Notes (Signed)
  Subjective:    Patient ID: Bridget Mendez, female    DOB: 1919-12-19, 78 y.o.   MRN: [id number]  HPI Wants to review the xanax Just requested refill due to being at pharmacy to refill paroxetine Shows me bottle from 12/31 that still has a lot of med in it  Wonders about getting off or decreasing meds Feels her memory is not as good  Still on edge---like jumps when ice cubes drop in freezer Feels her comprehension is not great and some trouble communicating Somewhat confused at times---not clear about this  Still does her regular exercises  Takes the xanax --every night (2)  Current Outpatient Prescriptions on File Prior to Visit  Medication Sig Dispense Refill  . ALPRAZolam (XANAX) 0.25 MG tablet TAKE ONE TABLET BY MOUTH TWICE DAILY AS NEEDED FOR ANXIETY AND TAKE ONE TO TWO TABLETS BY MOUTH AT BEDTIME AS NEEDED FOR SLEEP  100 tablet  0  . aspirin 81 MG tablet Take 81 mg by mouth daily.        . Calcium Carb-Cholecalciferol (CALCIUM 1000 + D PO) Take by mouth daily.        . Cholecalciferol (VITAMIN D) 1000 UNITS capsule Take 1,000 Units by mouth daily.        Marland Kitchen glucosamine-chondroitin 500-400 MG tablet Take 1 tablet by mouth daily.        . methylcellulose packet Take by mouth as needed.        . Multiple Vitamin (MULTIVITAMIN) tablet Take 1 tablet by mouth daily.        Marland Kitchen PARoxetine (PAXIL) 20 MG tablet Take 1 tablet (20 mg total) by mouth every morning.  90 tablet  3    No Known Allergies  Past Medical History  Diagnosis Date  . GERD (gastroesophageal reflux disease)   . Osteoarthritis   . Osteoporosis   . Depression   . Allergy   . Anxiety     Past Surgical History  Procedure Date  . Cesarean section     x2  . Hemorrhoid surgery   . Knee arthroscopy 1998 & 2005    both knees  . Inguinal hernia repair 02/2009    Dr.Wilton Tamala Julian    Family History  Problem Relation Age of Onset  . Parkinsonism Mother   . Cancer Maternal Grandmother     History    Social History  . Marital Status: Widowed    Spouse Name: N/A    Number of Children: 2  . Years of Education: N/A   Occupational History  . retired    Social History Main Topics  . Smoking status: Former Smoker -- 1.0 packs/day    Types: Cigarettes    Quit date: 07/11/1988  . Smokeless tobacco: Never Used  . Alcohol Use: Yes     2 drinks in the evening  . Drug Use: Not on file  . Sexually Active: Not on file   Other Topics Concern  . Not on file   Social History Narrative  . No narrative on file   Review of Systems No appetite problems Feels her exercise tolerance has improved     Objective:   Physical Exam  Constitutional: She appears well-developed and well-nourished. No distress.  Psychiatric:       Mild anxiety Not depressed          Assessment & Plan:

## 2011-08-18 NOTE — Assessment & Plan Note (Signed)
Has been relatively well controlled Voices concerns about the alprazolam which I agree with Discussed trying to wean off till only having prn use

## 2011-09-06 DIAGNOSIS — L0233 Carbuncle of buttock: Secondary | ICD-10-CM | POA: Diagnosis not present

## 2011-09-23 DIAGNOSIS — Z96659 Presence of unspecified artificial knee joint: Secondary | ICD-10-CM | POA: Diagnosis not present

## 2011-10-05 ENCOUNTER — Other Ambulatory Visit: Payer: Self-pay | Admitting: Internal Medicine

## 2011-10-05 MED ORDER — ALPRAZOLAM 0.25 MG PO TABS: 0.2500 mg | ORAL_TABLET | Freq: Every evening | ORAL | Status: DC | PRN

## 2011-10-05 NOTE — Telephone Encounter (Signed)
rx called into pharmacy

## 2011-10-05 NOTE — Telephone Encounter (Signed)
Okay #100 x 0

## 2011-10-17 ENCOUNTER — Encounter: Payer: Self-pay | Admitting: Internal Medicine

## 2011-10-17 ENCOUNTER — Ambulatory Visit (INDEPENDENT_AMBULATORY_CARE_PROVIDER_SITE_OTHER): Payer: Medicare Other | Admitting: Internal Medicine

## 2011-10-17 VITALS — BP 110/70 | HR 74 | Temp 98.7°F | Ht 66.0 in | Wt 163.0 lb

## 2011-10-17 DIAGNOSIS — Z0000 Encounter for general adult medical examination without abnormal findings: Secondary | ICD-10-CM | POA: Diagnosis not present

## 2011-10-17 DIAGNOSIS — K5900 Constipation, unspecified: Secondary | ICD-10-CM | POA: Diagnosis not present

## 2011-10-17 DIAGNOSIS — M1990 Unspecified osteoarthritis, unspecified site: Secondary | ICD-10-CM | POA: Diagnosis not present

## 2011-10-17 DIAGNOSIS — F411 Generalized anxiety disorder: Secondary | ICD-10-CM | POA: Diagnosis not present

## 2011-10-17 DIAGNOSIS — M810 Age-related osteoporosis without current pathological fracture: Secondary | ICD-10-CM | POA: Diagnosis not present

## 2011-10-17 DIAGNOSIS — K589 Irritable bowel syndrome without diarrhea: Secondary | ICD-10-CM | POA: Diagnosis not present

## 2011-10-17 LAB — BASIC METABOLIC PANEL
CO2: 28 mEq/L (ref 19–32)
Calcium: 9.4 mg/dL (ref 8.4–10.5)
Chloride: 103 mEq/L (ref 96–112)
Creatinine, Ser: 1.1 mg/dL (ref 0.4–1.2)
Glucose, Bld: 86 mg/dL (ref 70–99)

## 2011-10-17 LAB — CBC WITH DIFFERENTIAL/PLATELET
Basophils Absolute: 0 10*3/uL (ref 0.0–0.1)
Basophils Relative: 0.3 % (ref 0.0–3.0)
Eosinophils Relative: 1.3 % (ref 0.0–5.0)
HCT: 39.6 % (ref 36.0–46.0)
Hemoglobin: 13 g/dL (ref 12.0–15.0)
Lymphocytes Relative: 30.7 % (ref 12.0–46.0)
Lymphs Abs: 2.3 10*3/uL (ref 0.7–4.0)
Monocytes Relative: 11.3 % (ref 3.0–12.0)
Neutro Abs: 4.2 10*3/uL (ref 1.4–7.7)
RBC: 4.09 Mil/uL (ref 3.87–5.11)
RDW: 14.4 % (ref 11.5–14.6)
WBC: 7.5 10*3/uL (ref 4.5–10.5)

## 2011-10-17 LAB — HEPATIC FUNCTION PANEL
ALT: 14 U/L (ref 0–35)
AST: 19 U/L (ref 0–37)
Albumin: 4.3 g/dL (ref 3.5–5.2)
Alkaline Phosphatase: 63 U/L (ref 39–117)
Total Protein: 7.2 g/dL (ref 6.0–8.3)

## 2011-10-17 NOTE — Assessment & Plan Note (Signed)
Does well with the glucosamine chondroitin

## 2011-10-17 NOTE — Patient Instructions (Signed)
Please sign designated party release for daughter

## 2011-10-17 NOTE — Progress Notes (Signed)
Subjective:    Patient ID: Bridget Mendez, female    DOB: [birth year], 78 y.o.   MRN: [id number]  HPI Here for Medicare wellness exam Reviewed advanced directives No depression or anhedonia No falls---continues to do [place] and other exercise classes Ongoing hearing issues---has hearing aides that have helped  Anxiety has been controlled Still uses the alprazolam at bedtime--sleeps well with this No longer taking the morning dose  No arthritis pain now On glucosamine chondroitin  Occ heartburn symptoms Notes if she overeats some Uses beano at times---may help some gassiness Bowels okay on citrucel  Still on calcium and vitamin d  Current Outpatient Prescriptions on File Prior to Visit  Medication Sig Dispense Refill  . ALPRAZolam (XANAX) 0.25 MG tablet Take 1-2 tablets (0.25-0.5 mg total) by mouth at bedtime as needed. Only if unable to sleep  100 tablet  0  . aspirin 81 MG tablet Take 81 mg by mouth daily.        . Calcium Carb-Cholecalciferol (CALCIUM 1000 + D PO) Take by mouth daily.        . Cholecalciferol (VITAMIN D) 1000 UNITS capsule Take 1,000 Units by mouth daily.        Marland Kitchen glucosamine-chondroitin 500-400 MG tablet Take 1 tablet by mouth daily.        . methylcellulose packet Take by mouth as needed.        . Multiple Vitamin (MULTIVITAMIN) tablet Take 1 tablet by mouth daily.        Marland Kitchen PARoxetine (PAXIL) 20 MG tablet Take 1 tablet (20 mg total) by mouth every morning.  90 tablet  3    No Known Allergies  Past Medical History  Diagnosis Date  . GERD (gastroesophageal reflux disease)   . Osteoarthritis   . Osteoporosis   . Depression   . Allergy   . Anxiety     Past Surgical History  Procedure Date  . Cesarean section     x2  . Hemorrhoid surgery   . Knee arthroscopy 1998 & 2005    both knees  . Inguinal hernia repair 02/2009    Dr.Wilton Tamala Julian    Family History  Problem Relation Age of Onset  . Parkinsonism Mother   . Cancer Maternal  Grandmother     History   Social History  . Marital Status: Widowed    Spouse Name: N/A    Number of Children: 2  . Years of Education: N/A   Occupational History  . retired    Social History Main Topics  . Smoking status: Former Smoker -- 1.0 packs/day    Types: Cigarettes    Quit date: 07/11/1988  . Smokeless tobacco: Never Used  . Alcohol Use: Yes     2 drinks in the evening  . Drug Use: Not on file  . Sexually Active: Not on file   Other Topics Concern  . Not on file   Social History Narrative   Has living willDaughter, [name], is health care POAHas DNR already--reviewedNo tube feeds if cognitively aware   Review of Systems Sleeps great Appetite is fine Nocturia x 3 or so Weight stable     Objective:   Physical Exam  Constitutional: She is oriented to person, place, and time. She appears well-developed and well-nourished. No distress.  Neck: Normal range of motion. Neck supple. No thyromegaly present.  Cardiovascular: Normal rate, regular rhythm and intact distal pulses.  Exam reveals no gallop.   Murmur heard.  Soft systolic murmur at base  Pulmonary/Chest: Effort normal and breath sounds normal. No respiratory distress. She has no wheezes. She has no rales.  Abdominal: Soft. There is no tenderness.  Musculoskeletal: She exhibits no edema and no tenderness.  Lymphadenopathy:    She has no cervical adenopathy.  Neurological: She is alert and oriented to person, place, and time.       President--"Obama, Clinton, Bush (after prompting) [id number] Recall 3/3  Skin: No rash noted.  Psychiatric: She has a normal mood and affect. Her behavior is normal. Thought content normal.       Assessment & Plan:

## 2011-10-17 NOTE — Assessment & Plan Note (Signed)
Controlled on the paroxetine Uses the alprazolam for sleep---discussed trying to skip doses occ (she was reluctant)

## 2011-10-17 NOTE — Assessment & Plan Note (Signed)
Regular exercise as well as vitamin D and calcium

## 2011-10-17 NOTE — Assessment & Plan Note (Signed)
Uses beano at times No sig issue though

## 2011-10-17 NOTE — Assessment & Plan Note (Signed)
I have personally reviewed the Medicare Annual Wellness questionnaire and have noted 1. The patient's medical and social history 2. Their use of alcohol, tobacco or illicit drugs 3. Their current medications and supplements 4. The patient's functional ability including ADL's, fall risks, home safety risks and hearing or visual             impairment. 5. Diet and physical activities 6. Evidence for depression or mood disorders  The patients weight, height, BMI and visual acuity have been recorded in the chart I have made referrals, counseling and provided education to the patient based review of the above and I have provided the pt with a written personalized care plan for preventive services.  I have provided you with a copy of your personalized plan for preventive services. Please take the time to review along with your updated medication list.  Had zostavax --added No cancer screening

## 2011-10-18 ENCOUNTER — Encounter: Payer: Self-pay | Admitting: *Deleted

## 2011-12-20 ENCOUNTER — Other Ambulatory Visit: Payer: Self-pay | Admitting: Internal Medicine

## 2011-12-20 NOTE — Telephone Encounter (Signed)
rx sent to pharmacy by e-script rx called into pharmacy  

## 2011-12-20 NOTE — Telephone Encounter (Signed)
Okay paroxetine for a year Alprazolam #100 x 0

## 2012-01-11 DIAGNOSIS — G5760 Lesion of plantar nerve, unspecified lower limb: Secondary | ICD-10-CM | POA: Diagnosis not present

## 2012-01-26 DIAGNOSIS — H2510 Age-related nuclear cataract, unspecified eye: Secondary | ICD-10-CM | POA: Diagnosis not present

## 2012-02-24 ENCOUNTER — Telehealth: Payer: Self-pay | Admitting: Internal Medicine

## 2012-02-24 NOTE — Telephone Encounter (Signed)
Patient wants to know if she can reduce her Paxil from 20mg  to 10mg .  Patient thinks she's taking to much Paxil.  Patient uses [facility] on [address].

## 2012-02-24 NOTE — Telephone Encounter (Signed)
Please make sure her mood has been okay If it has for at least the past 3 months, she can reduce the paxil but I recommend taking 20mg  on even days and 10mg  on odd days for the next month. Then if she is still doing well, she can cut to 10mg  daily Make sure she has a follow up appt

## 2012-02-24 NOTE — Telephone Encounter (Signed)
.  left message to have patient return my call.  

## 2012-02-24 NOTE — Telephone Encounter (Signed)
Pt left v/m returning Dee's call at 4:36pm.

## 2012-02-27 ENCOUNTER — Telehealth: Payer: Self-pay | Admitting: *Deleted

## 2012-02-27 NOTE — Telephone Encounter (Signed)
Patient calling wants to know how to decrease from 20mg  of paxil to 10 mg of paxil per day. Please advise.

## 2012-02-28 ENCOUNTER — Telehealth: Payer: Self-pay | Admitting: Internal Medicine

## 2012-02-28 DIAGNOSIS — M999 Biomechanical lesion, unspecified: Secondary | ICD-10-CM | POA: Diagnosis not present

## 2012-02-28 DIAGNOSIS — M5137 Other intervertebral disc degeneration, lumbosacral region: Secondary | ICD-10-CM | POA: Diagnosis not present

## 2012-02-28 DIAGNOSIS — IMO0001 Reserved for inherently not codable concepts without codable children: Secondary | ICD-10-CM | POA: Diagnosis not present

## 2012-02-28 NOTE — Telephone Encounter (Signed)
Ms Bridget Mendez calling back from 02/24/12 message. Per EPIC note read to patient in regards to decreasing Paxil dose from 20mg  to 10mg  qd. Ms Bridget Mendez states feeling ok, just decrease in concentration and feels less medicine may benefit her. Patient aware of Dr Alla German response : "If it has for at least the past 3 months, she can reduce the paxil but I recommend taking 20mg  on even days and 10mg  on odd days for the next month. Then if she is still doing well, she can cut to 10mg  daily". Ms Bridget Mendez will call later for followup appointment. Benita Stabile RN/ CAN

## 2012-02-29 NOTE — Telephone Encounter (Signed)
Okay 

## 2012-03-23 ENCOUNTER — Other Ambulatory Visit: Payer: Self-pay | Admitting: Internal Medicine

## 2012-03-23 NOTE — Telephone Encounter (Signed)
Medication phoned to pharmacy.  

## 2012-03-23 NOTE — Telephone Encounter (Signed)
Electronic refill request

## 2012-03-23 NOTE — Telephone Encounter (Signed)
Okay paroxetine for a year Alprazolam #100 x 0

## 2012-03-26 NOTE — Telephone Encounter (Signed)
#  100 x 0 just done on 9/13

## 2012-03-26 NOTE — Telephone Encounter (Signed)
ok 

## 2012-04-04 ENCOUNTER — Ambulatory Visit (INDEPENDENT_AMBULATORY_CARE_PROVIDER_SITE_OTHER): Payer: Medicare Other | Admitting: Internal Medicine

## 2012-04-04 ENCOUNTER — Encounter: Payer: Self-pay | Admitting: Internal Medicine

## 2012-04-04 VITALS — BP 120/70 | HR 79 | Temp 98.3°F | Ht 66.0 in | Wt 166.0 lb

## 2012-04-04 DIAGNOSIS — F411 Generalized anxiety disorder: Secondary | ICD-10-CM | POA: Diagnosis not present

## 2012-04-04 DIAGNOSIS — M1990 Unspecified osteoarthritis, unspecified site: Secondary | ICD-10-CM | POA: Diagnosis not present

## 2012-04-04 DIAGNOSIS — K589 Irritable bowel syndrome without diarrhea: Secondary | ICD-10-CM | POA: Diagnosis not present

## 2012-04-04 DIAGNOSIS — M810 Age-related osteoporosis without current pathological fracture: Secondary | ICD-10-CM | POA: Diagnosis not present

## 2012-04-04 NOTE — Assessment & Plan Note (Signed)
On calcium and vitamin D Exercises 4 days per week---tai chi and balance classes

## 2012-04-04 NOTE — Patient Instructions (Signed)
Please stay on the paroxetine at 10mg  daily

## 2012-04-04 NOTE — Assessment & Plan Note (Signed)
Doing okay with weaning Will go down to 10mg  daily May need to go back up if anxiety flares

## 2012-04-04 NOTE — Assessment & Plan Note (Signed)
Okay with just glucosamine and chondroitin Stays active

## 2012-04-04 NOTE — Assessment & Plan Note (Signed)
Has been quiet Uses citrucel daily and hasn't needed beeno much

## 2012-04-04 NOTE — Progress Notes (Signed)
Subjective:    Patient ID: Bridget Mendez, female    DOB: [birth year], 78 y.o.   MRN: [id number]  HPI Doing fine Feels well  Reduced the paroxetine to 20mg  alternating with the 10mg  Does feel she may be "a little sharper" No depression at all Very excited about recent 89th birthday and feeling well Some ongoing anxiety though  Stomach has been better Less trouble with IBS Eating well---weight is up 3#--notices some enlargement of stomach Has rarely needed the beano now Uses citrucel for bowels  No chest pain No SOB Stays active  No major arthritis pain Still on glucosamine and chondroitin  Current Outpatient Prescriptions on File Prior to Visit  Medication Sig Dispense Refill  . ALPRAZolam (XANAX) 0.25 MG tablet TAKE ONE TO TWO TABLETS BY MOUTH AT BEDTIME AS NEEDED *USE ONLY IF UNABLE TO SLEEP  100 tablet  0  . aspirin 81 MG tablet Take 81 mg by mouth daily.        . Calcium Carb-Cholecalciferol (CALCIUM 1000 + D PO) Take by mouth daily.        . Cholecalciferol (VITAMIN D) 1000 UNITS capsule Take 1,000 Units by mouth daily.        Marland Kitchen glucosamine-chondroitin 500-400 MG tablet Take 1 tablet by mouth daily.        . methylcellulose packet Take by mouth as needed.        . Multiple Vitamin (MULTIVITAMIN) tablet Take 1 tablet by mouth daily.        Marland Kitchen PARoxetine (PAXIL) 10 MG tablet TAKE ONE TABLET BY MOUTH AT BEDTIME  90 tablet  3  . DISCONTD: PARoxetine (PAXIL) 20 MG tablet TAKE ONE TABLET BY MOUTH EVERY DAY IN THE MORNING  90 tablet  3    No Known Allergies  Past Medical History  Diagnosis Date  . GERD (gastroesophageal reflux disease)   . Osteoarthritis   . Osteoporosis   . Depression   . Allergy   . Anxiety     Past Surgical History  Procedure Date  . Cesarean section     x2  . Hemorrhoid surgery   . Knee arthroscopy 1998 & 2005    both knees  . Inguinal hernia repair 02/2009    Dr.Wilton Tamala Julian    Family History  Problem Relation Age of Onset  .  Parkinsonism Mother   . Cancer Maternal Grandmother     History   Social History  . Marital Status: Widowed    Spouse Name: N/A    Number of Children: 2  . Years of Education: N/A   Occupational History  . retired    Social History Main Topics  . Smoking status: Former Smoker -- 1.0 packs/day    Types: Cigarettes    Quit date: 07/11/1988  . Smokeless tobacco: Never Used  . Alcohol Use: Yes     2 drinks in the evening  . Drug Use: Not on file  . Sexually Active: Not on file   Other Topics Concern  . Not on file   Social History Narrative   Has living willDaughter, [name], is health care POAHas DNR already--reviewedNo tube feeds if cognitively aware   Review of Systems Sleeps well--just with 1 alprazolam    Objective:   Physical Exam  Constitutional: She appears well-developed and well-nourished. No distress.  Neck: Normal range of motion. Neck supple. No thyromegaly present.  Cardiovascular: Normal rate, regular rhythm and normal heart sounds.  Exam reveals no gallop.   No murmur heard.  Pulmonary/Chest: Effort normal and breath sounds normal. No respiratory distress. She has no wheezes. She has no rales.  Abdominal: Soft. There is no tenderness.  Musculoskeletal: She exhibits no edema and no tenderness.  Lymphadenopathy:    She has no cervical adenopathy.  Psychiatric: She has a normal mood and affect. Her behavior is normal. Thought content normal.          Assessment & Plan:

## 2012-04-10 DIAGNOSIS — L821 Other seborrheic keratosis: Secondary | ICD-10-CM | POA: Diagnosis not present

## 2012-04-10 DIAGNOSIS — L820 Inflamed seborrheic keratosis: Secondary | ICD-10-CM | POA: Diagnosis not present

## 2012-04-10 DIAGNOSIS — D239 Other benign neoplasm of skin, unspecified: Secondary | ICD-10-CM | POA: Diagnosis not present

## 2012-04-10 DIAGNOSIS — Z85820 Personal history of malignant melanoma of skin: Secondary | ICD-10-CM | POA: Diagnosis not present

## 2012-04-10 DIAGNOSIS — L578 Other skin changes due to chronic exposure to nonionizing radiation: Secondary | ICD-10-CM | POA: Diagnosis not present

## 2012-05-08 DIAGNOSIS — Z23 Encounter for immunization: Secondary | ICD-10-CM | POA: Diagnosis not present

## 2012-06-26 ENCOUNTER — Other Ambulatory Visit: Payer: Self-pay | Admitting: Internal Medicine

## 2012-06-26 NOTE — Telephone Encounter (Signed)
Okay #100 x 0

## 2012-06-26 NOTE — Telephone Encounter (Signed)
rx called into pharmacy

## 2012-07-06 DIAGNOSIS — M999 Biomechanical lesion, unspecified: Secondary | ICD-10-CM | POA: Diagnosis not present

## 2012-07-06 DIAGNOSIS — M5430 Sciatica, unspecified side: Secondary | ICD-10-CM | POA: Diagnosis not present

## 2012-07-06 DIAGNOSIS — M5137 Other intervertebral disc degeneration, lumbosacral region: Secondary | ICD-10-CM | POA: Diagnosis not present

## 2012-07-09 DIAGNOSIS — IMO0001 Reserved for inherently not codable concepts without codable children: Secondary | ICD-10-CM | POA: Diagnosis not present

## 2012-07-09 DIAGNOSIS — M5137 Other intervertebral disc degeneration, lumbosacral region: Secondary | ICD-10-CM | POA: Diagnosis not present

## 2012-07-09 DIAGNOSIS — M999 Biomechanical lesion, unspecified: Secondary | ICD-10-CM | POA: Diagnosis not present

## 2012-07-13 DIAGNOSIS — M999 Biomechanical lesion, unspecified: Secondary | ICD-10-CM | POA: Diagnosis not present

## 2012-07-13 DIAGNOSIS — IMO0001 Reserved for inherently not codable concepts without codable children: Secondary | ICD-10-CM | POA: Diagnosis not present

## 2012-07-13 DIAGNOSIS — M5137 Other intervertebral disc degeneration, lumbosacral region: Secondary | ICD-10-CM | POA: Diagnosis not present

## 2012-07-18 DIAGNOSIS — G5760 Lesion of plantar nerve, unspecified lower limb: Secondary | ICD-10-CM | POA: Diagnosis not present

## 2012-07-19 ENCOUNTER — Telehealth: Payer: Self-pay

## 2012-07-19 DIAGNOSIS — M545 Low back pain: Secondary | ICD-10-CM

## 2012-07-19 NOTE — Telephone Encounter (Signed)
I am okay with her trying course of PT at [facility] I will need to see her if she is not feeling better after the PT

## 2012-07-19 NOTE — Telephone Encounter (Signed)
Pt left v/m requesting physical therapy for pt's back and request order faxed to [facility] at [phone]. Pt's foot  slipped off curb in November and gave back a jolt;pt did not fall; pt has seen 2 chiropractors. Pt said low back pain is feeling better but went to a program about services offered at [facility] this morning and pt thinks PT might get her back to normal.Pt did not want to schedule appt with Dr Silvio Pate until got his opinion about PT .Please advise.

## 2012-07-20 NOTE — Telephone Encounter (Signed)
Order faxed to [facility] and [facility] for the patient.

## 2012-07-25 DIAGNOSIS — R262 Difficulty in walking, not elsewhere classified: Secondary | ICD-10-CM | POA: Diagnosis not present

## 2012-07-25 DIAGNOSIS — M25559 Pain in unspecified hip: Secondary | ICD-10-CM | POA: Diagnosis not present

## 2012-07-25 DIAGNOSIS — M6281 Muscle weakness (generalized): Secondary | ICD-10-CM | POA: Diagnosis not present

## 2012-07-26 DIAGNOSIS — M6281 Muscle weakness (generalized): Secondary | ICD-10-CM | POA: Diagnosis not present

## 2012-07-26 DIAGNOSIS — M25559 Pain in unspecified hip: Secondary | ICD-10-CM | POA: Diagnosis not present

## 2012-07-26 DIAGNOSIS — R262 Difficulty in walking, not elsewhere classified: Secondary | ICD-10-CM | POA: Diagnosis not present

## 2012-07-27 DIAGNOSIS — M6281 Muscle weakness (generalized): Secondary | ICD-10-CM | POA: Diagnosis not present

## 2012-07-27 DIAGNOSIS — M25559 Pain in unspecified hip: Secondary | ICD-10-CM | POA: Diagnosis not present

## 2012-07-27 DIAGNOSIS — R262 Difficulty in walking, not elsewhere classified: Secondary | ICD-10-CM | POA: Diagnosis not present

## 2012-07-30 DIAGNOSIS — R262 Difficulty in walking, not elsewhere classified: Secondary | ICD-10-CM | POA: Diagnosis not present

## 2012-07-30 DIAGNOSIS — M25559 Pain in unspecified hip: Secondary | ICD-10-CM | POA: Diagnosis not present

## 2012-07-30 DIAGNOSIS — M6281 Muscle weakness (generalized): Secondary | ICD-10-CM | POA: Diagnosis not present

## 2012-08-01 DIAGNOSIS — M6281 Muscle weakness (generalized): Secondary | ICD-10-CM | POA: Diagnosis not present

## 2012-08-01 DIAGNOSIS — R262 Difficulty in walking, not elsewhere classified: Secondary | ICD-10-CM | POA: Diagnosis not present

## 2012-08-01 DIAGNOSIS — M25559 Pain in unspecified hip: Secondary | ICD-10-CM | POA: Diagnosis not present

## 2012-08-02 DIAGNOSIS — M6281 Muscle weakness (generalized): Secondary | ICD-10-CM | POA: Diagnosis not present

## 2012-08-02 DIAGNOSIS — R262 Difficulty in walking, not elsewhere classified: Secondary | ICD-10-CM | POA: Diagnosis not present

## 2012-08-02 DIAGNOSIS — M25559 Pain in unspecified hip: Secondary | ICD-10-CM | POA: Diagnosis not present

## 2012-08-06 DIAGNOSIS — R262 Difficulty in walking, not elsewhere classified: Secondary | ICD-10-CM | POA: Diagnosis not present

## 2012-08-06 DIAGNOSIS — M6281 Muscle weakness (generalized): Secondary | ICD-10-CM | POA: Diagnosis not present

## 2012-08-06 DIAGNOSIS — M25559 Pain in unspecified hip: Secondary | ICD-10-CM | POA: Diagnosis not present

## 2012-08-07 ENCOUNTER — Telehealth: Payer: Self-pay | Admitting: *Deleted

## 2012-08-07 DIAGNOSIS — R269 Unspecified abnormalities of gait and mobility: Secondary | ICD-10-CM | POA: Insufficient documentation

## 2012-08-07 NOTE — Telephone Encounter (Signed)
Pt says she is currently in therapy and wants to begin "balance program" at [facility], she says it is covered by Medicare if you order it.

## 2012-08-08 DIAGNOSIS — M6281 Muscle weakness (generalized): Secondary | ICD-10-CM | POA: Diagnosis not present

## 2012-08-08 DIAGNOSIS — M25559 Pain in unspecified hip: Secondary | ICD-10-CM | POA: Diagnosis not present

## 2012-08-08 DIAGNOSIS — R262 Difficulty in walking, not elsewhere classified: Secondary | ICD-10-CM | POA: Diagnosis not present

## 2012-08-10 DIAGNOSIS — R262 Difficulty in walking, not elsewhere classified: Secondary | ICD-10-CM | POA: Diagnosis not present

## 2012-08-10 DIAGNOSIS — M6281 Muscle weakness (generalized): Secondary | ICD-10-CM | POA: Diagnosis not present

## 2012-08-10 DIAGNOSIS — M25559 Pain in unspecified hip: Secondary | ICD-10-CM | POA: Diagnosis not present

## 2012-08-14 DIAGNOSIS — R262 Difficulty in walking, not elsewhere classified: Secondary | ICD-10-CM | POA: Diagnosis not present

## 2012-08-14 DIAGNOSIS — M6281 Muscle weakness (generalized): Secondary | ICD-10-CM | POA: Diagnosis not present

## 2012-08-14 DIAGNOSIS — M25559 Pain in unspecified hip: Secondary | ICD-10-CM | POA: Diagnosis not present

## 2012-08-16 DIAGNOSIS — M25559 Pain in unspecified hip: Secondary | ICD-10-CM | POA: Diagnosis not present

## 2012-08-16 DIAGNOSIS — R262 Difficulty in walking, not elsewhere classified: Secondary | ICD-10-CM | POA: Diagnosis not present

## 2012-08-16 DIAGNOSIS — M6281 Muscle weakness (generalized): Secondary | ICD-10-CM | POA: Diagnosis not present

## 2012-08-17 DIAGNOSIS — M6281 Muscle weakness (generalized): Secondary | ICD-10-CM | POA: Diagnosis not present

## 2012-08-17 DIAGNOSIS — M25559 Pain in unspecified hip: Secondary | ICD-10-CM | POA: Diagnosis not present

## 2012-08-17 DIAGNOSIS — R262 Difficulty in walking, not elsewhere classified: Secondary | ICD-10-CM | POA: Diagnosis not present

## 2012-08-19 DIAGNOSIS — M6281 Muscle weakness (generalized): Secondary | ICD-10-CM | POA: Diagnosis not present

## 2012-08-19 DIAGNOSIS — R262 Difficulty in walking, not elsewhere classified: Secondary | ICD-10-CM | POA: Diagnosis not present

## 2012-08-19 DIAGNOSIS — M25559 Pain in unspecified hip: Secondary | ICD-10-CM | POA: Diagnosis not present

## 2012-08-21 DIAGNOSIS — M6281 Muscle weakness (generalized): Secondary | ICD-10-CM | POA: Diagnosis not present

## 2012-08-21 DIAGNOSIS — M25559 Pain in unspecified hip: Secondary | ICD-10-CM | POA: Diagnosis not present

## 2012-08-21 DIAGNOSIS — R262 Difficulty in walking, not elsewhere classified: Secondary | ICD-10-CM | POA: Diagnosis not present

## 2012-08-22 DIAGNOSIS — R262 Difficulty in walking, not elsewhere classified: Secondary | ICD-10-CM | POA: Diagnosis not present

## 2012-08-22 DIAGNOSIS — M6281 Muscle weakness (generalized): Secondary | ICD-10-CM | POA: Diagnosis not present

## 2012-08-22 DIAGNOSIS — M25559 Pain in unspecified hip: Secondary | ICD-10-CM | POA: Diagnosis not present

## 2012-08-27 DIAGNOSIS — M25559 Pain in unspecified hip: Secondary | ICD-10-CM | POA: Diagnosis not present

## 2012-08-27 DIAGNOSIS — R262 Difficulty in walking, not elsewhere classified: Secondary | ICD-10-CM | POA: Diagnosis not present

## 2012-08-27 DIAGNOSIS — M6281 Muscle weakness (generalized): Secondary | ICD-10-CM | POA: Diagnosis not present

## 2012-08-28 DIAGNOSIS — M25559 Pain in unspecified hip: Secondary | ICD-10-CM | POA: Diagnosis not present

## 2012-08-28 DIAGNOSIS — M6281 Muscle weakness (generalized): Secondary | ICD-10-CM | POA: Diagnosis not present

## 2012-08-28 DIAGNOSIS — R262 Difficulty in walking, not elsewhere classified: Secondary | ICD-10-CM | POA: Diagnosis not present

## 2012-08-30 DIAGNOSIS — M6281 Muscle weakness (generalized): Secondary | ICD-10-CM | POA: Diagnosis not present

## 2012-08-30 DIAGNOSIS — R262 Difficulty in walking, not elsewhere classified: Secondary | ICD-10-CM | POA: Diagnosis not present

## 2012-09-29 ENCOUNTER — Other Ambulatory Visit: Payer: Self-pay | Admitting: Internal Medicine

## 2012-10-01 NOTE — Telephone Encounter (Signed)
rx called into pharmacy

## 2012-10-01 NOTE — Telephone Encounter (Signed)
Okay #100 x 0

## 2012-10-22 DIAGNOSIS — G5760 Lesion of plantar nerve, unspecified lower limb: Secondary | ICD-10-CM | POA: Diagnosis not present

## 2012-10-23 ENCOUNTER — Ambulatory Visit: Payer: Medicare Other | Admitting: Internal Medicine

## 2012-11-05 ENCOUNTER — Ambulatory Visit (INDEPENDENT_AMBULATORY_CARE_PROVIDER_SITE_OTHER): Payer: Medicare Other | Admitting: Internal Medicine

## 2012-11-05 ENCOUNTER — Encounter: Payer: Self-pay | Admitting: Internal Medicine

## 2012-11-05 VITALS — BP 130/80 | HR 75 | Temp 99.3°F | Wt 167.0 lb

## 2012-11-05 DIAGNOSIS — Z0000 Encounter for general adult medical examination without abnormal findings: Secondary | ICD-10-CM

## 2012-11-05 DIAGNOSIS — K589 Irritable bowel syndrome without diarrhea: Secondary | ICD-10-CM

## 2012-11-05 DIAGNOSIS — M1990 Unspecified osteoarthritis, unspecified site: Secondary | ICD-10-CM | POA: Diagnosis not present

## 2012-11-05 DIAGNOSIS — F411 Generalized anxiety disorder: Secondary | ICD-10-CM

## 2012-11-05 DIAGNOSIS — K219 Gastro-esophageal reflux disease without esophagitis: Secondary | ICD-10-CM

## 2012-11-05 LAB — BASIC METABOLIC PANEL
Chloride: 101 mEq/L (ref 96–112)
Potassium: 4.3 mEq/L (ref 3.5–5.1)
Sodium: 136 mEq/L (ref 135–145)

## 2012-11-05 LAB — CBC WITH DIFFERENTIAL/PLATELET
Basophils Relative: 0.5 % (ref 0.0–3.0)
Eosinophils Relative: 0.9 % (ref 0.0–5.0)
HCT: 42 % (ref 36.0–46.0)
Hemoglobin: 14.1 g/dL (ref 12.0–15.0)
Lymphs Abs: 2.1 10*3/uL (ref 0.7–4.0)
MCV: 96.4 fl (ref 78.0–100.0)
Monocytes Absolute: 0.8 10*3/uL (ref 0.1–1.0)
Monocytes Relative: 9.8 % (ref 3.0–12.0)
RBC: 4.36 Mil/uL (ref 3.87–5.11)
WBC: 8.5 10*3/uL (ref 4.5–10.5)

## 2012-11-05 LAB — HEPATIC FUNCTION PANEL
ALT: 16 U/L (ref 0–35)
AST: 20 U/L (ref 0–37)
Total Protein: 7.4 g/dL (ref 6.0–8.3)

## 2012-11-05 NOTE — Progress Notes (Signed)
Subjective:    Patient ID: Bridget Mendez, female    DOB: 1919/11/26, 79 y.o.   MRN: [id number]  HPI Here for Medicare wellness and follow up visit Reviewed advanced directives Former smoker Regular alcohol-- 2 "watered down [employer]" No falls  No depression or anhedonia Independent with ADLs and instrumental ADLs Mild memory problems---thinks her concentration is worse Wears hearing aides Vision is fine Cancer screening not indicated UTD on imms  Feels her anxiety is better Uses xanax only at night Has cut down on the paroxetine  Notes some balance problems --- no functional problems though, just trouble balancing on 1 foot Does Tai Chi and other exercises  Some arthritic pain at times Some in left hip---therapy helped this Still some soreness and tightness Some in thumbs Sees podiatrist and has had some cortisone injections  No stomach problems Uses tums at bedtime fairly regularly No nocturnal symptoms or heartburn though  Current Outpatient Prescriptions on File Prior to Visit  Medication Sig Dispense Refill  . ALPRAZolam (XANAX) 0.25 MG tablet TAKE ONE TO TWO TABLETS BY MOUTH AT BEDTIME AS NEEDED *USE ONLY IF UNABLE TO SLEEP*  100 tablet  0  . aspirin 81 MG tablet Take 81 mg by mouth daily.        . Calcium Carb-Cholecalciferol (CALCIUM 1000 + D PO) Take by mouth daily.        . Cholecalciferol (VITAMIN D) 1000 UNITS capsule Take 1,000 Units by mouth daily.        Marland Kitchen glucosamine-chondroitin 500-400 MG tablet Take 1 tablet by mouth daily.        . methylcellulose packet Take by mouth as needed.        . Multiple Vitamin (MULTIVITAMIN) tablet Take 1 tablet by mouth daily.        Marland Kitchen PARoxetine (PAXIL) 10 MG tablet TAKE ONE TABLET BY MOUTH AT BEDTIME  90 tablet  3  . Probiotic Product (PROBIOTIC DAILY) CAPS Take by mouth daily.       No current facility-administered medications on file prior to visit.    No Known Allergies  Past Medical History  Diagnosis Date   . GERD (gastroesophageal reflux disease)   . Osteoarthritis   . Osteoporosis   . Depression   . Allergy   . Anxiety     Past Surgical History  Procedure Laterality Date  . Cesarean section      x2  . Hemorrhoid surgery    . Knee arthroscopy  1998 & 2005    both knees  . Inguinal hernia repair  02/2009    Dr.Wilton Tamala Julian    Family History  Problem Relation Age of Onset  . Parkinsonism Mother   . Cancer Maternal Grandmother     History   Social History  . Marital Status: Widowed    Spouse Name: N/A    Number of Children: 2  . Years of Education: N/A   Occupational History  . retired    Social History Main Topics  . Smoking status: Former Smoker -- 1.00 packs/day    Types: Cigarettes    Quit date: 07/11/1988  . Smokeless tobacco: Never Used  . Alcohol Use: Yes     Comment: 2 drinks in the evening  . Drug Use: Not on file  . Sexually Active: Not on file   Other Topics Concern  . Not on file   Social History Narrative   Has living will   Daughter, [name], is health care POA   Has  DNR already--reviewed   No tube feeds if cognitively aware         Review of Systems Sleeps okay with meds Bowels okay with citrucel Appetite is "too good" Weight is stable    Objective:   Physical Exam  Constitutional: She is oriented to person, place, and time. She appears well-developed and well-nourished. No distress.  Neck: Normal range of motion. Neck supple. No thyromegaly present.  Cardiovascular: Normal rate, regular rhythm and normal heart sounds.  Exam reveals no gallop.   No murmur heard. Pulmonary/Chest: Effort normal and breath sounds normal. No respiratory distress. She has no wheezes. She has no rales.  Abdominal: Soft. There is no tenderness.  Musculoskeletal: She exhibits no edema and no tenderness.  Lymphadenopathy:    She has no cervical adenopathy.  Neurological: She is alert and oriented to person, place, and time.  President-- "Obama, Remer Macho, another Tawni Pummel" [phone]-  (wanted to start at 89). Had sig trouble  D-l-[id number] Recall 3/3  Psychiatric: She has a normal mood and affect. Her behavior is normal.  Mood is neutral but slightly pressured speech          Assessment & Plan:

## 2012-11-05 NOTE — Patient Instructions (Signed)
Please decrease the paroxetine to 10mg  six days a week. If doing well, cut down to 5 days per week in June, 4 days per week in July, etc. If your anxiety worsens, go back to the last level at which you felt it was controlled.

## 2012-11-05 NOTE — Assessment & Plan Note (Signed)
Some gassiness but no heartburn, dysphagia, water brash Okay to continue just tums

## 2012-11-05 NOTE — Assessment & Plan Note (Signed)
Mild symptoms at most Interested in getting off paxil---will have her slowly wean off

## 2012-11-05 NOTE — Assessment & Plan Note (Signed)
I have personally reviewed the Medicare Annual Wellness questionnaire and have noted 1. The patient's medical and social history 2. Their use of alcohol, tobacco or illicit drugs 3. Their current medications and supplements 4. The patient's functional ability including ADL's, fall risks, home safety risks and hearing or visual             impairment. 5. Diet and physical activities 6. Evidence for depression or mood disorders  The patients weight, height, BMI and visual acuity have been recorded in the chart I have made referrals, counseling and provided education to the patient based review of the above and I have provided the pt with a written personalized care plan for preventive services.  I have provided you with a copy of your personalized plan for preventive services. Please take the time to review along with your updated medication list.  UTD with interventions No major concerns noted

## 2012-11-05 NOTE — Assessment & Plan Note (Signed)
Doing very well on probiotic

## 2012-11-05 NOTE — Assessment & Plan Note (Signed)
Tries to stay active No regular meds except glucosamine

## 2013-01-03 ENCOUNTER — Other Ambulatory Visit: Payer: Self-pay | Admitting: Internal Medicine

## 2013-01-03 NOTE — Telephone Encounter (Signed)
rx called into pharmacy

## 2013-01-03 NOTE — Telephone Encounter (Signed)
Okay #100 x 0

## 2013-02-06 DIAGNOSIS — H4010X0 Unspecified open-angle glaucoma, stage unspecified: Secondary | ICD-10-CM | POA: Diagnosis not present

## 2013-02-20 ENCOUNTER — Telehealth: Payer: Self-pay

## 2013-02-20 NOTE — Telephone Encounter (Signed)
Pt left v/m; pt has stressed her back and having low back pain and request an order for physical therapy faxed to [facility] PT dept. Unable to reach pt by phone for more info on how pt stressed her back.Please advise.

## 2013-02-21 NOTE — Telephone Encounter (Signed)
Pt called back to ck on status of PT request; 2 weeks ago pt picked up 1/2 of watermelon and developed low back pain at spine; no radiation of pain into legs. Pt has been alternating heat and ice with no relief of pain. Pt said hurts worse when getting up and down,pain level now 5. Pt request call back when order faxed to [facility].

## 2013-02-21 NOTE — Telephone Encounter (Signed)
Per ok from pt, left message on machine with results

## 2013-02-21 NOTE — Telephone Encounter (Signed)
Please let her know that I should evaluate her first and then decide about appropriate treatment

## 2013-02-22 ENCOUNTER — Encounter: Payer: Self-pay | Admitting: *Deleted

## 2013-02-22 ENCOUNTER — Ambulatory Visit (INDEPENDENT_AMBULATORY_CARE_PROVIDER_SITE_OTHER): Payer: Medicare Other | Admitting: Internal Medicine

## 2013-02-22 ENCOUNTER — Encounter: Payer: Self-pay | Admitting: Internal Medicine

## 2013-02-22 VITALS — BP 140/70 | HR 90 | Wt 168.0 lb

## 2013-02-22 DIAGNOSIS — S335XXA Sprain of ligaments of lumbar spine, initial encounter: Secondary | ICD-10-CM

## 2013-02-22 NOTE — Progress Notes (Signed)
Subjective:    Patient ID: Bridget Mendez, female    DOB: 1919-09-26, 79 y.o.   MRN: [id number]  HPI Has been "doing some foolish things" Lifted heavy watermelon into car and in kitchen Has been having low back pain since then--over the past 2 weeks Has been trying tummy tuck foundations--does feel better when on but trouble getting in and out of them  Twisted low back and now worsening Has used some naproxen 1 bid and tylenol at dinner--not clearly helpful Moist heat, tried cold also. Intermittently slightly better Feels she would benefit from PT  No radiation down legs or up back No leg weakness  Current Outpatient Prescriptions on File Prior to Visit  Medication Sig Dispense Refill  . ALPRAZolam (XANAX) 0.25 MG tablet TAKE ONE TO TWO TABLETS BY MOUTH AT BEDTIME AS NEEDED. USE ONLY IF UNABLE TO SLEEP  100 tablet  0  . aspirin 81 MG tablet Take 81 mg by mouth daily.        . Calcium Carb-Cholecalciferol (CALCIUM 1000 + D PO) Take by mouth daily.        . Cholecalciferol (VITAMIN D) 1000 UNITS capsule Take 1,000 Units by mouth daily.        Marland Kitchen glucosamine-chondroitin 500-400 MG tablet Take 1 tablet by mouth daily.        . methylcellulose packet Take by mouth as needed.        . Multiple Vitamin (MULTIVITAMIN) tablet Take 1 tablet by mouth daily.        Marland Kitchen PARoxetine (PAXIL) 10 MG tablet TAKE ONE TABLET BY MOUTH AT BEDTIME  90 tablet  3  . Probiotic Product (PROBIOTIC DAILY) CAPS Take by mouth daily.       No current facility-administered medications on file prior to visit.    No Known Allergies  Past Medical History  Diagnosis Date  . GERD (gastroesophageal reflux disease)   . Osteoarthritis   . Osteoporosis   . Depression   . Allergy   . Anxiety     Past Surgical History  Procedure Laterality Date  . Cesarean section      x2  . Hemorrhoid surgery    . Knee arthroscopy  1998 & 2005    both knees  . Inguinal hernia repair  02/2009    Dr.Wilton Tamala Julian    Family  History  Problem Relation Age of Onset  . Parkinsonism Mother   . Cancer Maternal Grandmother     History   Social History  . Marital Status: Widowed    Spouse Name: N/A    Number of Children: 2  . Years of Education: N/A   Occupational History  . retired    Social History Main Topics  . Smoking status: Former Smoker -- 1.00 packs/day    Types: Cigarettes    Quit date: 07/11/1988  . Smokeless tobacco: Never Used  . Alcohol Use: Yes     Comment: 2 drinks in the evening  . Drug Use: Not on file  . Sexual Activity: Not on file   Other Topics Concern  . Not on file   Social History Narrative   Has living will   Daughter, [name], is health care POA   Has DNR already--reviewed   No tube feeds if cognitively aware         Review of Systems No bladder problems Loose stool yesterday but no loss of bowel function    Objective:   Physical Exam  Constitutional: She appears well-developed and  well-nourished. No distress.  Musculoskeletal:  Tenderness along low back ~L5 (left > right) No spine tenderness SLR negative bilaterally Normal hip ROM  Neurological:  Gait normal No focal weakness          Assessment & Plan:

## 2013-02-22 NOTE — Assessment & Plan Note (Signed)
Seems to be just muscular No signs of more serious injury Will refer to [facility] PT

## 2013-02-26 DIAGNOSIS — M549 Dorsalgia, unspecified: Secondary | ICD-10-CM | POA: Diagnosis not present

## 2013-02-27 DIAGNOSIS — M999 Biomechanical lesion, unspecified: Secondary | ICD-10-CM | POA: Diagnosis not present

## 2013-02-27 DIAGNOSIS — M5137 Other intervertebral disc degeneration, lumbosacral region: Secondary | ICD-10-CM | POA: Diagnosis not present

## 2013-02-27 DIAGNOSIS — IMO0001 Reserved for inherently not codable concepts without codable children: Secondary | ICD-10-CM | POA: Diagnosis not present

## 2013-02-28 DIAGNOSIS — M549 Dorsalgia, unspecified: Secondary | ICD-10-CM | POA: Diagnosis not present

## 2013-03-01 DIAGNOSIS — M549 Dorsalgia, unspecified: Secondary | ICD-10-CM | POA: Diagnosis not present

## 2013-03-03 DIAGNOSIS — M549 Dorsalgia, unspecified: Secondary | ICD-10-CM | POA: Diagnosis not present

## 2013-03-04 DIAGNOSIS — IMO0001 Reserved for inherently not codable concepts without codable children: Secondary | ICD-10-CM | POA: Diagnosis not present

## 2013-03-04 DIAGNOSIS — M999 Biomechanical lesion, unspecified: Secondary | ICD-10-CM | POA: Diagnosis not present

## 2013-03-04 DIAGNOSIS — M5137 Other intervertebral disc degeneration, lumbosacral region: Secondary | ICD-10-CM | POA: Diagnosis not present

## 2013-03-05 DIAGNOSIS — M549 Dorsalgia, unspecified: Secondary | ICD-10-CM | POA: Diagnosis not present

## 2013-03-06 DIAGNOSIS — M549 Dorsalgia, unspecified: Secondary | ICD-10-CM | POA: Diagnosis not present

## 2013-03-07 ENCOUNTER — Other Ambulatory Visit: Payer: Self-pay | Admitting: Internal Medicine

## 2013-03-12 DIAGNOSIS — M549 Dorsalgia, unspecified: Secondary | ICD-10-CM | POA: Diagnosis not present

## 2013-03-14 DIAGNOSIS — M549 Dorsalgia, unspecified: Secondary | ICD-10-CM | POA: Diagnosis not present

## 2013-03-16 DIAGNOSIS — M549 Dorsalgia, unspecified: Secondary | ICD-10-CM | POA: Diagnosis not present

## 2013-04-11 DIAGNOSIS — Z85820 Personal history of malignant melanoma of skin: Secondary | ICD-10-CM | POA: Diagnosis not present

## 2013-04-11 DIAGNOSIS — D239 Other benign neoplasm of skin, unspecified: Secondary | ICD-10-CM | POA: Diagnosis not present

## 2013-04-11 DIAGNOSIS — L821 Other seborrheic keratosis: Secondary | ICD-10-CM | POA: Diagnosis not present

## 2013-04-11 DIAGNOSIS — L578 Other skin changes due to chronic exposure to nonionizing radiation: Secondary | ICD-10-CM | POA: Diagnosis not present

## 2013-04-11 DIAGNOSIS — D1800 Hemangioma unspecified site: Secondary | ICD-10-CM | POA: Diagnosis not present

## 2013-04-17 ENCOUNTER — Ambulatory Visit: Payer: Self-pay | Admitting: Podiatry

## 2013-04-18 ENCOUNTER — Ambulatory Visit (INDEPENDENT_AMBULATORY_CARE_PROVIDER_SITE_OTHER): Payer: Medicare Other | Admitting: Podiatry

## 2013-04-18 ENCOUNTER — Encounter: Payer: Self-pay | Admitting: Podiatry

## 2013-04-18 VITALS — BP 137/87 | HR 99 | Resp 20 | Ht 64.0 in | Wt 160.0 lb

## 2013-04-18 DIAGNOSIS — G5782 Other specified mononeuropathies of left lower limb: Secondary | ICD-10-CM

## 2013-04-18 DIAGNOSIS — G588 Other specified mononeuropathies: Secondary | ICD-10-CM | POA: Insufficient documentation

## 2013-04-18 DIAGNOSIS — G5760 Lesion of plantar nerve, unspecified lower limb: Secondary | ICD-10-CM

## 2013-04-18 NOTE — Patient Instructions (Signed)
Morton's Neuroma Neuralgia (nerve pain) or neuroma (benign [non-cancerous] nerve tumor) may develop on any interdigital nerve. The interdigital nerves (nerves between digits) of the foot travel beneath and between the metatarsals (long bones of the fore foot) and pass the nerve endings to the toes. The third interdigital is a common place for a small neuroma to form called Morton's neuroma. Another nerve to be affected commonly is the fourth interdigital nerve. This would be in approximately in the area of the base or ball under the bottom of your fourth toe. This condition occurs more commonly in women and is usually on one side. It is usually first noticed by pain radiating (spreading) to the ball of the foot or to the toes. CAUSES The cause of interdigital neuralgia may be from low grade repetitive trauma (damage caused by an accident) as in activities causing a repeated pounding of the foot (running, jumping etc.). It is also caused by improper footwear or recent loss of the fatty padding on the bottom of the foot. TREATMENT  The condition often resolves (goes away) simply with decreasing activity if that is thought to be the cause. Proper shoes are beneficial. Orthotics (special foot support aids) such as a metatarsal bar are often beneficial. This condition usually responds to conservative therapy, however if surgery is necessary it usually brings complete relief. HOME CARE INSTRUCTIONS   Apply ice to the area of soreness for 15-20 minutes, 3-4 times per day, while awake for the first 2 days. Put ice in a plastic bag and place a towel between the bag of ice and your skin.  Only take over-the-counter or prescription medicines for pain, discomfort, or fever as directed by your caregiver. MAKE SURE YOU:   Understand these instructions.  Will watch your condition.  Will get help right away if you are not doing well or get worse. Document Released: 10/03/2000 Document Revised: 09/19/2011 Document  Reviewed: 06/27/2005 [facility] Patient Information 2014 [place].

## 2013-04-18 NOTE — Progress Notes (Signed)
Bridget Mendez. presents today with a chief complaint of painful toes third and fourth of the left foot. States that she's had some back issues which have not allow her to get here earlier. States that she is having pain on the bottom of the foot as well.  Objective: Pulses are palpable to the bilateral lower extremity +2/4 BP and PT bilaterally. Capillary fill time to digits one through 5 of the bilateral foot is noted to be immediate. She has a hammertoe to the second digit left foot with medial deviation for the hallux. She has pain on palpation to the third interdigital space with a palpable click. She also has pain on palpation to the second interdigital space left.  Assessment: Neuroma third interdigital space primarily left. Second interdigital space left.  Plan: Injected Kenalog and local anesthetic to the second and third interdigital spaces of the left foot. She will followup with [place] on an as-needed basis.

## 2013-05-08 ENCOUNTER — Other Ambulatory Visit: Payer: Self-pay | Admitting: Internal Medicine

## 2013-05-08 NOTE — Telephone Encounter (Signed)
Last filled 01/03/2013

## 2013-05-08 NOTE — Telephone Encounter (Signed)
Okay #100 x 0

## 2013-05-08 NOTE — Telephone Encounter (Signed)
rx called into pharmacy

## 2013-05-09 DIAGNOSIS — Z23 Encounter for immunization: Secondary | ICD-10-CM | POA: Diagnosis not present

## 2013-06-24 ENCOUNTER — Other Ambulatory Visit: Payer: Self-pay | Admitting: Internal Medicine

## 2013-07-31 ENCOUNTER — Other Ambulatory Visit: Payer: Self-pay | Admitting: Internal Medicine

## 2013-07-31 NOTE — Telephone Encounter (Signed)
Okay #100 x 0

## 2013-07-31 NOTE — Telephone Encounter (Signed)
05/08/13 

## 2013-07-31 NOTE — Telephone Encounter (Signed)
rx called into pharmacy

## 2013-08-09 ENCOUNTER — Telehealth: Payer: Self-pay | Admitting: *Deleted

## 2013-08-09 NOTE — Telephone Encounter (Signed)
PT CALLED WANTING TO KNOW IF SHE CAN COME IN ON 2.16.15 FOR AN INJECTION.  YES, SHE DOES HAVE AN APPT THAT DAY. HER LAST APPT WAS IN OCT FOR A KENALOG INJECTION.

## 2013-08-12 NOTE — Telephone Encounter (Signed)
That will be fine. 

## 2013-08-12 NOTE — Telephone Encounter (Signed)
Spoke with pt letting her know dr. Milinda Pointer said that she can come in for an injection.

## 2013-08-26 ENCOUNTER — Encounter: Payer: Self-pay | Admitting: Podiatry

## 2013-08-26 ENCOUNTER — Ambulatory Visit (INDEPENDENT_AMBULATORY_CARE_PROVIDER_SITE_OTHER): Payer: Medicare Other | Admitting: Podiatry

## 2013-08-26 VITALS — BP 130/78 | HR 94 | Resp 16

## 2013-08-26 DIAGNOSIS — M778 Other enthesopathies, not elsewhere classified: Secondary | ICD-10-CM

## 2013-08-26 DIAGNOSIS — M779 Enthesopathy, unspecified: Principal | ICD-10-CM

## 2013-08-26 DIAGNOSIS — M7750 Other enthesopathy of unspecified foot: Secondary | ICD-10-CM | POA: Diagnosis not present

## 2013-08-26 NOTE — Progress Notes (Signed)
Need an injection in my left foot .  Objective: Vital signs are stable she is alert and oriented x3. She has pain on palpation to the second third and fourth interdigital spaces of the left foot.  Assessment: Neuroma second interdigital space third interdigital space and fourth interdigital space of the left foot.  Plan: Injected dexamethasone x3 to the left foot today.

## 2013-08-28 ENCOUNTER — Telehealth: Payer: Self-pay | Admitting: Internal Medicine

## 2013-08-28 NOTE — Telephone Encounter (Signed)
Spoke with patient and advised results, she not's so nauseated it's just the vomiting, pt declined the mediation and will call if things get worse

## 2013-08-28 NOTE — Telephone Encounter (Signed)
This has been going around [facility] Do not take imodium or any med for the diarrhea---it may prolong the illness. Keep up with lots of fluids As long as no significant abdominal pain, can try ondansetron 4mg  tid prn for nausea (#10 x 0)

## 2013-08-28 NOTE — Telephone Encounter (Signed)
Pt has been vomiting and has had diarrhea for 24 hours and is a [employer] pt. Pt is wanting to know if something could be called in for that?? Pt uses [facility] on [address] because they deliver. Please advise

## 2013-08-30 ENCOUNTER — Encounter: Payer: Self-pay | Admitting: Internal Medicine

## 2013-08-30 ENCOUNTER — Ambulatory Visit (INDEPENDENT_AMBULATORY_CARE_PROVIDER_SITE_OTHER): Payer: Medicare Other | Admitting: Internal Medicine

## 2013-08-30 VITALS — BP 138/70 | HR 86 | Temp 98.5°F | Wt 163.0 lb

## 2013-08-30 DIAGNOSIS — K5289 Other specified noninfective gastroenteritis and colitis: Secondary | ICD-10-CM

## 2013-08-30 DIAGNOSIS — K529 Noninfective gastroenteritis and colitis, unspecified: Secondary | ICD-10-CM | POA: Insufficient documentation

## 2013-08-30 MED ORDER — ONDANSETRON HCL 4 MG PO TABS: 4.0000 mg | ORAL_TABLET | Freq: Three times a day (TID) | ORAL | Status: DC | PRN

## 2013-08-30 NOTE — Patient Instructions (Signed)
Viral Gastroenteritis Viral gastroenteritis is also known as stomach flu. This condition affects the stomach and intestinal tract. It can cause sudden diarrhea and vomiting. The illness typically lasts 3 to 8 days. Most people develop an immune response that eventually gets rid of the virus. While this natural response develops, the virus can make you quite ill. CAUSES  Many different viruses can cause gastroenteritis, such as rotavirus or noroviruses. You can catch one of these viruses by consuming contaminated food or water. You may also catch a virus by sharing utensils or other personal items with an infected person or by touching a contaminated surface. SYMPTOMS  The most common symptoms are diarrhea and vomiting. These problems can cause a severe loss of body fluids (dehydration) and a body salt (electrolyte) imbalance. Other symptoms may include:  Fever.  Headache.  Fatigue.  Abdominal pain. DIAGNOSIS  Your caregiver can usually diagnose viral gastroenteritis based on your symptoms and a physical exam. A stool sample may also be taken to test for the presence of viruses or other infections. TREATMENT  This illness typically goes away on its own. Treatments are aimed at rehydration. The most serious cases of viral gastroenteritis involve vomiting so severely that you are not able to keep fluids down. In these cases, fluids must be given through an intravenous line (IV). HOME CARE INSTRUCTIONS   Drink enough fluids to keep your urine clear or pale yellow. Drink small amounts of fluids frequently and increase the amounts as tolerated.  Ask your caregiver for specific rehydration instructions.  Avoid:  Foods high in sugar.  Alcohol.  Carbonated drinks.  Tobacco.  Juice.  Caffeine drinks.  Extremely hot or cold fluids.  Fatty, greasy foods.  Too much intake of anything at one time.  Dairy products until 24 to 48 hours after diarrhea stops.  You may consume probiotics.  Probiotics are active cultures of beneficial bacteria. They may lessen the amount and number of diarrheal stools in adults. Probiotics can be found in yogurt with active cultures and in supplements.  Wash your hands well to avoid spreading the virus.  Only take over-the-counter or prescription medicines for pain, discomfort, or fever as directed by your caregiver. Do not give aspirin to children. Antidiarrheal medicines are not recommended.  Ask your caregiver if you should continue to take your regular prescribed and over-the-counter medicines.  Keep all follow-up appointments as directed by your caregiver. SEEK IMMEDIATE MEDICAL CARE IF:   You are unable to keep fluids down.  You do not urinate at least once every 6 to 8 hours.  You develop shortness of breath.  You notice blood in your stool or vomit. This may look like coffee grounds.  You have abdominal pain that increases or is concentrated in one small area (localized).  You have persistent vomiting or diarrhea.  You have a fever.  The patient is a child younger than 3 months, and he or she has a fever.  The patient is a child older than 3 months, and he or she has a fever and persistent symptoms.  The patient is a child older than 3 months, and he or she has a fever and symptoms suddenly get worse.  The patient is a baby, and he or she has no tears when crying. MAKE SURE YOU:   Understand these instructions.  Will watch your condition.  Will get help right away if you are not doing well or get worse. Document Released: 06/27/2005 Document Revised: 09/19/2011 Document Reviewed: 04/13/2011   [employer] Patient Information 2014 [employer].  

## 2013-08-30 NOTE — Assessment & Plan Note (Signed)
Seems to have the viral illness that has been going around [facility] Not dehydrated--but discussed pedialyte Will give antiemetic for prn use

## 2013-08-30 NOTE — Progress Notes (Signed)
Subjective:    Patient ID: Bridget Mendez, female    DOB: 06-01-1920, 80 y.o.   MRN: [id number]  HPI Awoke 3 days ago with nausea Had some vomiting spells Brief diarrhea as well Held off --thought it was a "bug" Bowels quieted down--just a small formed stool this week" Upper symptoms with nausea have persisted Even hard to swallow water---feels like it gets stuck at bottom of chest (has sense of burp) Feels hungry--eating very little (soup, oatmeal) Had recurrent vomiting spell last night  Last "blow out" with IBS was 3 years ago  Sipping water, has popsicles regularly  Current Outpatient Prescriptions on File Prior to Visit  Medication Sig Dispense Refill  . ALPRAZolam (XANAX) 0.25 MG tablet TAKE ONE TO TWO TABLETS BY MOUTH AT BEDTIME AS NEEDED  100 tablet  0  . aspirin 81 MG tablet Take 81 mg by mouth daily.        . Calcium Carb-Cholecalciferol (CALCIUM 1000 + D PO) Take by mouth daily.        . Cholecalciferol (VITAMIN D) 1000 UNITS capsule Take 1,000 Units by mouth daily.        Marland Kitchen glucosamine-chondroitin 500-400 MG tablet Take 1 tablet by mouth daily.        . methylcellulose packet Take by mouth as needed.        . Multiple Vitamin (MULTIVITAMIN) tablet Take 1 tablet by mouth daily.        Marland Kitchen PARoxetine (PAXIL) 10 MG tablet TAKE ONE TABLET BY MOUTH AT BEDTIME  90 tablet  0  . Probiotic Product (PROBIOTIC DAILY) CAPS Take by mouth daily.       No current facility-administered medications on file prior to visit.    No Known Allergies  Past Medical History  Diagnosis Date  . GERD (gastroesophageal reflux disease)   . Osteoarthritis   . Osteoporosis   . Depression   . Allergy   . Anxiety     Past Surgical History  Procedure Laterality Date  . Cesarean section      x2  . Hemorrhoid surgery    . Knee arthroscopy  1998 & 2005    both knees  . Inguinal hernia repair  02/2009    Dr.Wilton Tamala Julian    Family History  Problem Relation Age of Onset  .  Parkinsonism Mother   . Cancer Maternal Grandmother     History   Social History  . Marital Status: Widowed    Spouse Name: N/A    Number of Children: 2  . Years of Education: N/A   Occupational History  . retired    Social History Main Topics  . Smoking status: Former Smoker -- 1.00 packs/day    Types: Cigarettes    Quit date: 07/11/1988  . Smokeless tobacco: Never Used  . Alcohol Use: Yes     Comment: 2 drinks in the evening  . Drug Use: No  . Sexual Activity: Not on file   Other Topics Concern  . Not on file   Social History Narrative   Has living will   Daughter, [name], is health care POA   Has DNR already--reviewed   No tube feeds if cognitively aware         Review of Systems Fine until Tuesday Weight is stable Sleeping okay once stomach calms  No cough or breathing problems Voiding normally again    Objective:   Physical Exam  Constitutional: She appears well-developed and well-nourished. No distress.  Neck: Normal  range of motion. Neck supple.  Pulmonary/Chest: Effort normal and breath sounds normal. No respiratory distress. She has no wheezes. She has no rales.  Abdominal: Soft. Bowel sounds are normal. She exhibits no distension and no mass. There is no tenderness. There is no rebound and no guarding.  Lymphadenopathy:    She has no cervical adenopathy.          Assessment & Plan:

## 2013-08-30 NOTE — Progress Notes (Signed)
Pre visit review using our clinic review tool, if applicable. No additional management support is needed unless otherwise documented below in the visit note. 

## 2013-09-23 ENCOUNTER — Other Ambulatory Visit: Payer: Self-pay | Admitting: Internal Medicine

## 2013-10-25 ENCOUNTER — Telehealth: Payer: Self-pay | Admitting: Internal Medicine

## 2013-10-25 NOTE — Telephone Encounter (Signed)
Scheduled pt appt Monday 10/28/13 @ 12

## 2013-10-25 NOTE — Telephone Encounter (Signed)
Pt called requesting physical therapy at [facility] b/c of pain in groin/hip/buttocks area. Pt would like to start therapy as soon as possible. I informed her Dr. Silvio Pate is out until Monday 4/20 but I would check to see if another provider could make that call. Pt states to fax and order to [phone] if this is ok. Please advise.

## 2013-10-27 NOTE — Telephone Encounter (Signed)
Okay Best if I see her first and see what is going on

## 2013-10-28 ENCOUNTER — Ambulatory Visit (INDEPENDENT_AMBULATORY_CARE_PROVIDER_SITE_OTHER): Payer: Medicare Other | Admitting: Internal Medicine

## 2013-10-28 ENCOUNTER — Encounter: Payer: Self-pay | Admitting: Internal Medicine

## 2013-10-28 VITALS — BP 118/68 | HR 96 | Wt 168.0 lb

## 2013-10-28 DIAGNOSIS — M79609 Pain in unspecified limb: Secondary | ICD-10-CM | POA: Diagnosis not present

## 2013-10-28 DIAGNOSIS — M79604 Pain in right leg: Secondary | ICD-10-CM

## 2013-10-28 NOTE — Progress Notes (Signed)
Pre visit review using our clinic review tool, if applicable. No additional management support is needed unless otherwise documented below in the visit note. 

## 2013-10-28 NOTE — Assessment & Plan Note (Signed)
Seems related to hip--probably some arthritis and perhaps mild soft tissue sprain on top of that PT is a good idea for now

## 2013-10-28 NOTE — Progress Notes (Signed)
   Subjective:    Patient ID: Bridget Mendez, female    DOB: Jan 11, 1920, 80 y.o.   MRN: [id number]  HPI Has "stressed my spine area" by not doing anything Having right thigh pain when walking Had been going to chiropractor (Dr Laveda Abbe) but had bad visit and stopped going  First noticed several weeks ago Has worsened Trouble getting in and out of car now Some grabbing pain and tenderness along lateral right thigh  Has tried some ice--not helping Spoke to [place] the PT at [facility] who thinks she can help  Current Outpatient Prescriptions on File Prior to Visit  Medication Sig Dispense Refill  . aspirin 81 MG tablet Take 81 mg by mouth daily.        . Calcium Carb-Cholecalciferol (CALCIUM 1000 + D PO) Take by mouth daily.        . Cholecalciferol (VITAMIN D) 1000 UNITS capsule Take 1,000 Units by mouth daily.        Marland Kitchen glucosamine-chondroitin 500-400 MG tablet Take 1 tablet by mouth daily.        . methylcellulose packet Take by mouth as needed.        . Multiple Vitamin (MULTIVITAMIN) tablet Take 1 tablet by mouth daily.        Marland Kitchen PARoxetine (PAXIL) 10 MG tablet TAKE ONE TABLET BY MOUTH AT BEDTIME  90 tablet  0  . Probiotic Product (PROBIOTIC DAILY) CAPS Take by mouth daily.       No current facility-administered medications on file prior to visit.    No Known Allergies  Past Medical History  Diagnosis Date  . GERD (gastroesophageal reflux disease)   . Osteoarthritis   . Osteoporosis   . Depression   . Allergy   . Anxiety     Past Surgical History  Procedure Laterality Date  . Cesarean section      x2  . Hemorrhoid surgery    . Knee arthroscopy  1998 & 2005    both knees  . Inguinal hernia repair  02/2009    Dr.Wilton Tamala Julian    Family History  Problem Relation Age of Onset  . Parkinsonism Mother   . Cancer Maternal Grandmother     History   Social History  . Marital Status: Widowed    Spouse Name: N/A    Number of Children: 2  . Years of Education: N/A     Occupational History  . retired    Social History Main Topics  . Smoking status: Former Smoker -- 1.00 packs/day    Types: Cigarettes    Quit date: 07/11/1988  . Smokeless tobacco: Never Used  . Alcohol Use: Yes     Comment: 2 drinks in the evening  . Drug Use: No  . Sexual Activity: Not on file   Other Topics Concern  . Not on file   Social History Narrative   Has living will   Daughter, [name], is health care POA   Has DNR already--reviewed   No tube feeds if cognitively aware         Review of Systems Sleeps okay Appetite is fine     Objective:   Physical Exam  Musculoskeletal:  No back tenderness SLR negative Pain reproduced with internal rotation of hip  Neurological:  No leg weakness Slightly stiff antalgic gait at first---then loosens up          Assessment & Plan:

## 2013-10-29 DIAGNOSIS — M79609 Pain in unspecified limb: Secondary | ICD-10-CM | POA: Diagnosis not present

## 2013-10-29 DIAGNOSIS — M549 Dorsalgia, unspecified: Secondary | ICD-10-CM | POA: Diagnosis not present

## 2013-10-30 DIAGNOSIS — M79609 Pain in unspecified limb: Secondary | ICD-10-CM | POA: Diagnosis not present

## 2013-10-30 DIAGNOSIS — M549 Dorsalgia, unspecified: Secondary | ICD-10-CM | POA: Diagnosis not present

## 2013-11-01 DIAGNOSIS — M79609 Pain in unspecified limb: Secondary | ICD-10-CM | POA: Diagnosis not present

## 2013-11-01 DIAGNOSIS — M549 Dorsalgia, unspecified: Secondary | ICD-10-CM | POA: Diagnosis not present

## 2013-11-04 DIAGNOSIS — M549 Dorsalgia, unspecified: Secondary | ICD-10-CM | POA: Diagnosis not present

## 2013-11-04 DIAGNOSIS — M79609 Pain in unspecified limb: Secondary | ICD-10-CM | POA: Diagnosis not present

## 2013-11-05 ENCOUNTER — Telehealth: Payer: Self-pay

## 2013-11-05 DIAGNOSIS — M549 Dorsalgia, unspecified: Secondary | ICD-10-CM | POA: Diagnosis not present

## 2013-11-05 DIAGNOSIS — M79609 Pain in unspecified limb: Secondary | ICD-10-CM | POA: Diagnosis not present

## 2013-11-05 NOTE — Telephone Encounter (Signed)
Pt left v/m requesting mild pain medication sent to [facility].; ibuprofen is not helping hip and groin pain. Pt is presently in PT. Please advise.

## 2013-11-06 NOTE — Telephone Encounter (Signed)
Okay tramadol 50mg  1/2-1 bid prn #60 x 0  Let pharmacist know okay despite low dose paroxetine

## 2013-11-07 DIAGNOSIS — M549 Dorsalgia, unspecified: Secondary | ICD-10-CM | POA: Diagnosis not present

## 2013-11-07 DIAGNOSIS — M79609 Pain in unspecified limb: Secondary | ICD-10-CM | POA: Diagnosis not present

## 2013-11-07 MED ORDER — TRAMADOL HCL 50 MG PO TABS: 25.0000 mg | ORAL_TABLET | Freq: Two times a day (BID) | ORAL | Status: DC | PRN

## 2013-11-07 NOTE — Telephone Encounter (Signed)
Spoke with patient and advised results rx called into pharmacy  

## 2013-11-07 NOTE — Telephone Encounter (Signed)
Pt left v/m requesting cb about pain medication.

## 2013-11-08 NOTE — Telephone Encounter (Signed)
Spoke with patient and advised results  she will try everything and call on Monday if it's not working.

## 2013-11-08 NOTE — Telephone Encounter (Signed)
Amitiza, PT from [facility] left vm stating that pt is having increased pain in hip and needs advice.  I spoke with Amitiza who said that she worked with pt yesterday on stretching and hot/cold therapy.  When she saw pt today, she was unable to bear wt on R side without moderate-severe pain.    Spoke with pt.  She stated that it took her 2 days to get pain medication from our office (she called Tuesday and never heard anything back so she called again on Thursday).  She took 1/2 tablet of Tramadol yesterday at lunch and 1/2 tablet at dinner.  She woke up this morning, unable to bear weight on her right side.  She is comfortable while sitting.  She took 1 tablet Tramadol at breakfast at approx. 8:30.  When I spoke to her at 8:50, I told her that the medication needed a little more time to work and to call us back around 9:30 if she did not have any relief by then.    She is worried that she needs a stronger pain medication or muscle relaxer.

## 2013-11-08 NOTE — Telephone Encounter (Signed)
Please check on her later I don't recommend muscle relaxers   I would recommend she take a full tramadol if the half doesn't work. Also, tylenol will help the tramado-she should take 650mg  with each dose of tramadol If the hip pain remains severe, we can set her up with an orthopedist  BTW--therapist is Amrita

## 2013-11-13 ENCOUNTER — Ambulatory Visit (INDEPENDENT_AMBULATORY_CARE_PROVIDER_SITE_OTHER): Payer: Medicare Other | Admitting: Internal Medicine

## 2013-11-13 ENCOUNTER — Encounter: Payer: Self-pay | Admitting: Internal Medicine

## 2013-11-13 VITALS — BP 130/70 | HR 87 | Wt 165.0 lb

## 2013-11-13 DIAGNOSIS — Z0000 Encounter for general adult medical examination without abnormal findings: Secondary | ICD-10-CM

## 2013-11-13 DIAGNOSIS — G479 Sleep disorder, unspecified: Secondary | ICD-10-CM

## 2013-11-13 DIAGNOSIS — Z23 Encounter for immunization: Secondary | ICD-10-CM

## 2013-11-13 DIAGNOSIS — F411 Generalized anxiety disorder: Secondary | ICD-10-CM

## 2013-11-13 DIAGNOSIS — K219 Gastro-esophageal reflux disease without esophagitis: Secondary | ICD-10-CM

## 2013-11-13 DIAGNOSIS — M79604 Pain in right leg: Secondary | ICD-10-CM

## 2013-11-13 DIAGNOSIS — M79609 Pain in unspecified limb: Secondary | ICD-10-CM | POA: Diagnosis not present

## 2013-11-13 LAB — CBC WITH DIFFERENTIAL/PLATELET
BASOS PCT: 0.5 % (ref 0.0–3.0)
Basophils Absolute: 0 10*3/uL (ref 0.0–0.1)
EOS ABS: 0.1 10*3/uL (ref 0.0–0.7)
EOS PCT: 1 % (ref 0.0–5.0)
HCT: 41.5 % (ref 36.0–46.0)
HEMOGLOBIN: 13.8 g/dL (ref 12.0–15.0)
Lymphocytes Relative: 26.1 % (ref 12.0–46.0)
Lymphs Abs: 2.3 10*3/uL (ref 0.7–4.0)
MCHC: 33.1 g/dL (ref 30.0–36.0)
MCV: 96.9 fl (ref 78.0–100.0)
MONOS PCT: 11.6 % (ref 3.0–12.0)
Monocytes Absolute: 1 10*3/uL (ref 0.1–1.0)
NEUTROS ABS: 5.3 10*3/uL (ref 1.4–7.7)
NEUTROS PCT: 60.8 % (ref 43.0–77.0)
Platelets: 392 10*3/uL (ref 150.0–400.0)
RBC: 4.28 Mil/uL (ref 3.87–5.11)
RDW: 14.2 % (ref 11.5–15.5)
WBC: 8.7 10*3/uL (ref 4.0–10.5)

## 2013-11-13 LAB — COMPREHENSIVE METABOLIC PANEL
ALBUMIN: 4.3 g/dL (ref 3.5–5.2)
ALT: 17 U/L (ref 0–35)
AST: 22 U/L (ref 0–37)
Alkaline Phosphatase: 75 U/L (ref 39–117)
BUN: 21 mg/dL (ref 6–23)
CALCIUM: 9.7 mg/dL (ref 8.4–10.5)
CHLORIDE: 96 meq/L (ref 96–112)
CO2: 28 meq/L (ref 19–32)
CREATININE: 1.1 mg/dL (ref 0.4–1.2)
GFR: 50.45 mL/min — AB (ref >60.00)
GLUCOSE: 91 mg/dL (ref 70–99)
POTASSIUM: 4.7 meq/L (ref 3.5–5.1)
Sodium: 134 mEq/L — ABNORMAL LOW (ref 135–145)
Total Bilirubin: 0.4 mg/dL (ref 0.2–1.2)
Total Protein: 7.6 g/dL (ref 6.0–8.3)

## 2013-11-13 NOTE — Addendum Note (Signed)
Addended by: Despina Hidden on: 11/13/2013 02:20 PM   Modules accepted: Orders

## 2013-11-13 NOTE — Assessment & Plan Note (Signed)
Controlled with probiotic 

## 2013-11-13 NOTE — Assessment & Plan Note (Signed)
Ongoing issues worse with her pain Will continue the meds

## 2013-11-13 NOTE — Progress Notes (Signed)
Subjective:    Patient ID: Bridget Mendez, female    DOB: 01/15/1920, 80 y.o.   MRN: [id number]  HPI Here for Medicare wellness and follow up Reviewed form Only sees eye doctor No falls No depression or anhedonia Reviewed advanced directives Some hearing problems--use aides. Vision is fine Tries to exercise regularly---limited with hip/leg pain No tobacco or alcohol (would have an occ drink but not while on the tramadol) No major memory issues  Has had 6 PT sessions at [facility] Has made it worse Discussed that orthopedic evaluation is next step Has appt with Dr Marry Guan 6/2 (seeing Vance Peper) Tramadol has helped minimally---had to increase to 1.5 bid  The ongoing pain has affected her anxiety Continues on the meds Generally does okay when not in pain  Stomach has been quiet for some time No meds other than probiotic No swallowing problems  Sleeps well with alprazolam Takes the paroxetine then also  Current Outpatient Prescriptions on File Prior to Visit  Medication Sig Dispense Refill  . ALPRAZolam (XANAX) 0.25 MG tablet Take 0.25 mg by mouth at bedtime as needed.      Marland Kitchen aspirin 81 MG tablet Take 81 mg by mouth daily.        . Calcium Carb-Cholecalciferol (CALCIUM 1000 + D PO) Take by mouth daily.        . Cholecalciferol (VITAMIN D) 1000 UNITS capsule Take 1,000 Units by mouth daily.        Marland Kitchen glucosamine-chondroitin 500-400 MG tablet Take 1 tablet by mouth daily.        . methylcellulose packet Take by mouth as needed.        . Multiple Vitamin (MULTIVITAMIN) tablet Take 1 tablet by mouth daily.        Marland Kitchen PARoxetine (PAXIL) 10 MG tablet TAKE ONE TABLET BY MOUTH AT BEDTIME  90 tablet  0  . Probiotic Product (PROBIOTIC DAILY) CAPS Take by mouth daily.      . traMADol (ULTRAM) 50 MG tablet Take 0.5-1 tablets (25-50 mg total) by mouth 2 (two) times daily as needed.  60 tablet  0   No current facility-administered medications on file prior to visit.    No Known  Allergies  Past Medical History  Diagnosis Date  . GERD (gastroesophageal reflux disease)   . Osteoarthritis   . Osteoporosis   . Depression   . Allergy   . Anxiety     Past Surgical History  Procedure Laterality Date  . Cesarean section      x2  . Hemorrhoid surgery    . Knee arthroscopy  1998 & 2005    both knees  . Inguinal hernia repair  02/2009    Dr.Wilton Tamala Julian    Family History  Problem Relation Age of Onset  . Parkinsonism Mother   . Cancer Maternal Grandmother     History   Social History  . Marital Status: Widowed    Spouse Name: N/A    Number of Children: 2  . Years of Education: N/A   Occupational History  . retired    Social History Main Topics  . Smoking status: Former Smoker -- 1.00 packs/day    Types: Cigarettes    Quit date: 07/11/1988  . Smokeless tobacco: Never Used  . Alcohol Use: Yes     Comment: 2 drinks in the evening  . Drug Use: No  . Sexual Activity: Not on file   Other Topics Concern  . Not on file  Social History Narrative   Has living will   Daughter, [name], is health care POA   Has DNR already--reviewed   No tube feeds if cognitively aware         Review of Systems Appetite is okay Weight down 3# but stable now No trouble voiding--but some increased frequency. Some constipation with the tramadol--uses citrucel daily and MOM as needed    Objective:   Physical Exam  Constitutional: She is oriented to person, place, and time. She appears well-developed and well-nourished. No distress.  Neck: Normal range of motion. Neck supple. No thyromegaly present.  Cardiovascular: Normal rate, regular rhythm, normal heart sounds and intact distal pulses.  Exam reveals no gallop.   No murmur heard. Pulmonary/Chest: Effort normal and breath sounds normal. No respiratory distress. She has no wheezes. She has no rales.  Abdominal: Soft. There is no tenderness.  Musculoskeletal: She exhibits no edema and no tenderness.    Lymphadenopathy:    She has no cervical adenopathy.  Neurological: She is alert and oriented to person, place, and time.  President-- "Obama, Tawni Pummel, another Bush,-- then [employer] [phone]-  Having trouble then D-[id number] Recall 2/3  Psychiatric: She has a normal mood and affect. Her behavior is normal.          Assessment & Plan:

## 2013-11-13 NOTE — Assessment & Plan Note (Signed)
Probably from the hip Will increase the tramadol Change to hydrocodone if this isn't effective Needs ortho ASAP---will try to move up the appt

## 2013-11-13 NOTE — Assessment & Plan Note (Signed)
I have personally reviewed the Medicare Annual Wellness questionnaire and have noted 1. The patient's medical and social history 2. Their use of alcohol, tobacco or illicit drugs 3. Their current medications and supplements 4. The patient's functional ability including ADL's, fall risks, home safety risks and hearing or visual             impairment. 5. Diet and physical activities 6. Evidence for depression or mood disorders  The patients weight, height, BMI and visual acuity have been recorded in the chart I have made referrals, counseling and provided education to the patient based review of the above and I have provided the pt with a written personalized care plan for preventive services.  I have provided you with a copy of your personalized plan for preventive services. Please take the time to review along with your updated medication list.  Will give prevnar No other preventative care other than yearly flu shot

## 2013-11-13 NOTE — Assessment & Plan Note (Signed)
Does okay with the alprazolam

## 2013-11-13 NOTE — Progress Notes (Signed)
Pre visit review using our clinic review tool, if applicable. No additional management support is needed unless otherwise documented below in the visit note. 

## 2013-11-14 ENCOUNTER — Telehealth: Payer: Self-pay

## 2013-11-14 MED ORDER — HYDROCODONE-ACETAMINOPHEN 5-325 MG PO TABS: 1.0000 | ORAL_TABLET | Freq: Four times a day (QID) | ORAL | Status: DC | PRN

## 2013-11-14 NOTE — Telephone Encounter (Signed)
Rx written.

## 2013-11-14 NOTE — Telephone Encounter (Signed)
Pt left v/m requesting new rx for hydrocodone. Pt was seen 11/13/13 and pt has decided needs to get hydrocodone. Call pt when rx ready for pick up.

## 2013-11-14 NOTE — Telephone Encounter (Signed)
Spoke with patient and advised rx ready for pick-up and it will be at the front desk.  

## 2013-11-15 ENCOUNTER — Encounter: Payer: Self-pay | Admitting: *Deleted

## 2013-11-18 ENCOUNTER — Other Ambulatory Visit: Payer: Self-pay

## 2013-11-18 NOTE — Telephone Encounter (Signed)
Pt request a refill of alprazolam to [facility] [address] Pt has ortho appt on 12/10/13; pt said hydrocodone apap is not lasting 4 hours; pt taking hydrocodone apap 1 1/2 tabs 3 times a day; at breakfast,lunch and dinner. Pt said rt foot pain was bad and pt is using walker. Pt request cb with what she can do for pain; pt did not request hydrocodone apap rx.

## 2013-11-19 MED ORDER — ALPRAZOLAM 0.25 MG PO TABS: 0.2500 mg | ORAL_TABLET | Freq: Every evening | ORAL | Status: DC | PRN

## 2013-11-19 NOTE — Telephone Encounter (Signed)
rx called into pharmacy  Spoke with patient and she wanted to know if her walking twice a day with a walker is doing her any harm? Pt also states she has told ortho she will see anybody.

## 2013-11-19 NOTE — Telephone Encounter (Signed)
Left message on VM per pt request, advised pt to call if any problems

## 2013-11-19 NOTE — Telephone Encounter (Signed)
Okay alprazolam #100 x 0  Has she tried to push up the ortho visit by taking any provider? She might try soaking in a warm tub to see if that helps I don't think we should increase the narcotics from what she is already taking

## 2013-11-19 NOTE — Telephone Encounter (Signed)
If she has pain after the walking, she should stop If might help her to do walking in the pool there at [facility] will keep her loose and help muscle strength, without strain on the leg

## 2013-11-20 ENCOUNTER — Encounter: Payer: Self-pay | Admitting: Internal Medicine

## 2013-11-20 ENCOUNTER — Encounter: Payer: Self-pay | Admitting: *Deleted

## 2013-11-20 ENCOUNTER — Ambulatory Visit (INDEPENDENT_AMBULATORY_CARE_PROVIDER_SITE_OTHER): Payer: Medicare Other | Admitting: Internal Medicine

## 2013-11-20 ENCOUNTER — Telehealth: Payer: Self-pay | Admitting: Internal Medicine

## 2013-11-20 VITALS — BP 120/70 | HR 84 | Temp 98.4°F | Wt 165.0 lb

## 2013-11-20 DIAGNOSIS — M79604 Pain in right leg: Secondary | ICD-10-CM

## 2013-11-20 DIAGNOSIS — M79609 Pain in unspecified limb: Secondary | ICD-10-CM

## 2013-11-20 MED ORDER — GABAPENTIN 100 MG PO CAPS: 100.0000 mg | ORAL_CAPSULE | Freq: Three times a day (TID) | ORAL | Status: DC

## 2013-11-20 NOTE — Patient Instructions (Signed)
Please start the gabapentin this afternoon ---when you are not going to go out anymore--and take another dose at bedtime. If no problems with sedation,etc, add the morning dose tomorrow morning. If you have no side effects, I would consider increasing the dose at bedtime. By Monday, if the medicine hasn't caused problems but the pain continues, you can increase to 2 pills at bedtime. Then increase to 3 pills at bedtime by Thursday (5/21) if needed.

## 2013-11-20 NOTE — Assessment & Plan Note (Signed)
Has some element of radiculopathy (L1-2) but also still has specific left hip symptoms Will add gabapentin Still awaiting ortho eval

## 2013-11-20 NOTE — Telephone Encounter (Signed)
She should have asked while here She had some still in bottle Okay to prepare #60 x 0 for me to sign

## 2013-11-20 NOTE — Telephone Encounter (Signed)
rx printed and ready to be signed, will let patient know she can pick up tomorrow

## 2013-11-20 NOTE — Progress Notes (Signed)
Subjective:    Patient ID: Bridget Mendez, female    DOB: 1919-12-06, 80 y.o.   MRN: [id number]  HPI Ongoing pain The hydrocodone not really helping---even 1.5 tabs at a time Only lasts for 2 hours at most  Did notice an improvement this morning when she took 1 at [employer] mostly with the walker Pain is in low left back and left buttock and right groin No radiation below thigh  Legs grab No persistent weakness  Current Outpatient Prescriptions on File Prior to Visit  Medication Sig Dispense Refill  . ALPRAZolam (XANAX) 0.25 MG tablet Take 1 tablet (0.25 mg total) by mouth at bedtime as needed.  100 tablet  0  . aspirin 81 MG tablet Take 81 mg by mouth daily.        . Calcium Carb-Cholecalciferol (CALCIUM 1000 + D PO) Take by mouth daily.        . Cholecalciferol (VITAMIN D) 1000 UNITS capsule Take 1,000 Units by mouth daily.        Marland Kitchen glucosamine-chondroitin 500-400 MG tablet Take 1 tablet by mouth daily.        Marland Kitchen HYDROcodone-acetaminophen (NORCO/VICODIN) 5-325 MG per tablet Take 1 tablet by mouth every 6 (six) hours as needed for moderate pain.  60 tablet  0  . methylcellulose packet Take by mouth as needed.        . Multiple Vitamin (MULTIVITAMIN) tablet Take 1 tablet by mouth daily.        Marland Kitchen PARoxetine (PAXIL) 10 MG tablet TAKE ONE TABLET BY MOUTH AT BEDTIME  90 tablet  0  . Probiotic Product (PROBIOTIC DAILY) CAPS Take by mouth daily.       No current facility-administered medications on file prior to visit.    No Known Allergies  Past Medical History  Diagnosis Date  . GERD (gastroesophageal reflux disease)   . Osteoarthritis   . Osteoporosis   . Depression   . Allergy   . Anxiety     Past Surgical History  Procedure Laterality Date  . Cesarean section      x2  . Hemorrhoid surgery    . Knee arthroscopy  1998 & 2005    both knees  . Inguinal hernia repair  02/2009    Dr.Wilton Tamala Julian    Family History  Problem Relation Age of Onset  . Parkinsonism  Mother   . Cancer Maternal Grandmother     History   Social History  . Marital Status: Widowed    Spouse Name: N/A    Number of Children: 2  . Years of Education: N/A   Occupational History  . retired    Social History Main Topics  . Smoking status: Former Smoker -- 1.00 packs/day    Types: Cigarettes    Quit date: 07/11/1988  . Smokeless tobacco: Never Used  . Alcohol Use: Yes     Comment: 2 drinks in the evening  . Drug Use: No  . Sexual Activity: Not on file   Other Topics Concern  . Not on file   Social History Narrative   Has living will   Daughter, [name], is health care POA   Has DNR already--reviewed   No tube feeds if cognitively aware         Review of Systems No bowel incontinence--uses citrucel and rare MOM Up for nocturia x 2-- no change    Objective:   Physical Exam  Constitutional: She appears well-developed and well-nourished. No distress.  Musculoskeletal:  No spine tenderness Still has pain with internal rotation of right hip ?SLR on right but negative on left  Neurological:  Antalgic gait Mild decreased strength in right hip flexors           Assessment & Plan:

## 2013-11-20 NOTE — Telephone Encounter (Signed)
Pt left v/m; pt has #26 hydrocodone left and pt said it is difficult for her to get transportation to pick up rx at our office. Pt request cb. Rx is ready to be signed from previous notation.

## 2013-11-21 ENCOUNTER — Telehealth: Payer: Self-pay | Admitting: Internal Medicine

## 2013-11-21 DIAGNOSIS — M79609 Pain in unspecified limb: Secondary | ICD-10-CM | POA: Diagnosis not present

## 2013-11-21 DIAGNOSIS — M549 Dorsalgia, unspecified: Secondary | ICD-10-CM | POA: Diagnosis not present

## 2013-11-21 NOTE — Telephone Encounter (Signed)
Patient filled out patient questionnaire and complained about having to wait until June 2nd to see an [place] of patient experience sent an email to [place] to see if we could help our patient be seen sooner than June 2nd. I called Ms Cerasoli and she has called Dr Tamala Julian at [facility] and she has an appt this Monday 11/25/13 at 1:15. I told patient we would send over her notes to Dr Tamala Julian

## 2013-11-21 NOTE — Telephone Encounter (Signed)
Thank you. I talked to her about seeing someone in that group to get an earlier appt

## 2013-11-22 ENCOUNTER — Telehealth: Payer: Self-pay | Admitting: Family Medicine

## 2013-11-22 NOTE — Telephone Encounter (Signed)
Patient Information:  Caller Name: [place]  Phone: [phone]  Patient: Bridget Mendez  Gender: Female  DOB: Jan 26, 1920  Age: 80 Years  PCP: Ria Bush [facility])  Office Follow Up:  Does the office need to follow up with this patient?: Yes  Instructions For The Office: Please ask Dr. Silvio Pate if Bridget Mendez can abruptly stop taking Gabapentin. It caused an issue with Bladder control overnight and she does not want to take it any longer.   Symptoms  Reason For Call & Symptoms: Calling to ask if she can stop taking Gabapentin, which she started on 11/20/13. She took 2 pills last night before bed to help with pain and it caused her to have an accident with her urine. Afebrile. She denies any other urinary sx. She would like to stop taking it and is not sure if it would need to be reduced gradually.  Reviewed Health History In EMR: Yes  Reviewed Medications In EMR: Yes  Reviewed Allergies In EMR: Yes  Reviewed Surgeries / Procedures: Yes  Date of Onset of Symptoms: 11/22/2013  Guideline(s) Used:  No Protocol Available - Information Only  Disposition Per Guideline:   Discuss with PCP and Callback by Nurse Today  Reason For Disposition Reached:   Nursing judgment  Advice Given:  Call Back If:  New symptoms develop  You become worse.  Patient Will Follow Care Advice:  YES

## 2013-11-22 NOTE — Telephone Encounter (Signed)
Pt left vm on triage phone stating that she had an "emergency" and needed to know if she could stop taking a medication today without side effects.  I attempted to call her back at the number she left on vm (only number on file).  No answer and unable to leave message.

## 2013-11-22 NOTE — Telephone Encounter (Signed)
Her only medication she shouldn't stop suddenly is the paroxetine

## 2013-11-22 NOTE — Telephone Encounter (Signed)
I don't think it is safe for her to take that much medicine and I am not going to give her something stronger (it really is not safe) I hope the orthopedist can help her on Monday

## 2013-11-22 NOTE — Telephone Encounter (Signed)
Spoke with patient and she was upset and confused because she thought she was getting a different pain med? Pt thinks hydrocodone and vicodin are 2 different pain meds. Pt never picked up prescription from Wednesday and when she called was told that Vicodin was ready to be picked up and now pt doesn't want the hydrocodone she wants vicodin. I tried to explain that they are the same and then she asked for Norco and I explained the difference in generic and brand name. Pt was not happy and wanted to know why Dr. Silvio Pate is trying to limit her on pain medication and again I explained that's it's not safe. Pt said thank you and hung up.

## 2013-11-22 NOTE — Telephone Encounter (Signed)
Pt states she is having extreme pain in her hip despite taking Hydrocodone 2 tablets q2h.  She cannot take the Gabapentin in addition to the Hydrocodone since she had a reaction.  She is requesting stronger pain medication to pick up today.

## 2013-11-22 NOTE — Telephone Encounter (Signed)
Error

## 2013-11-22 NOTE — Telephone Encounter (Signed)
Spoke with patient and advised results   

## 2013-11-23 NOTE — Telephone Encounter (Signed)
Please check on her Monday afternoon to see how her appt with the orthopedist went

## 2013-11-25 DIAGNOSIS — M76899 Other specified enthesopathies of unspecified lower limb, excluding foot: Secondary | ICD-10-CM | POA: Diagnosis not present

## 2013-11-25 NOTE — Telephone Encounter (Signed)
Spoke with patient and she wanted to know why Dr. Silvio Pate didn't suggest prednisone and a cortisone shot? Per pt she also wanted to know why Dr. Silvio Pate didn't suggest [facility]?

## 2013-11-25 NOTE — Telephone Encounter (Signed)
She had self referred to Bridget Mendez she got some relief--sorry she is still complaining

## 2013-12-04 ENCOUNTER — Other Ambulatory Visit: Payer: Self-pay | Admitting: Internal Medicine

## 2013-12-05 NOTE — Telephone Encounter (Signed)
Spoke with patient and advised rx ready for pick-up and it will be at the front desk.  

## 2013-12-05 NOTE — Telephone Encounter (Signed)
Pt left v/m requesting rx hydrocodone apap. Call when ready for pick up.  

## 2013-12-12 ENCOUNTER — Ambulatory Visit (INDEPENDENT_AMBULATORY_CARE_PROVIDER_SITE_OTHER): Payer: Medicare Other | Admitting: Internal Medicine

## 2013-12-12 ENCOUNTER — Encounter: Payer: Self-pay | Admitting: Internal Medicine

## 2013-12-12 VITALS — BP 128/70 | HR 87 | Wt 164.0 lb

## 2013-12-12 DIAGNOSIS — M79604 Pain in right leg: Secondary | ICD-10-CM

## 2013-12-12 DIAGNOSIS — M79609 Pain in unspecified limb: Secondary | ICD-10-CM

## 2013-12-12 NOTE — Progress Notes (Signed)
   Subjective:    Patient ID: Bridget Mendez, female    DOB: [birth year], 80 y.o.   MRN: [id number]  HPI "I feel you have let me down" Wanted help and the medications weren't helping I didn't diagnose the bursitis---finally did see [facility] finally and got injection into bursa (Dr Tamala Julian)  Was really in agony and finally called a different ortho group Made formal complaint to patient services when she got a survey in mail Got relief with the shot but then worse Dr Tamala Julian then Rx mobic--she feels that has helped  Had bad reaction to gabapentin Was incontinent of urine--- "opened me up like nothing else"  Still uses the hydrocodone at times [place] with cane   Review of Systems     Objective:   Physical Exam        Assessment & Plan:

## 2013-12-12 NOTE — Patient Instructions (Signed)
If your pain is better, you can try cutting the meloxicam (mobic) in half, and eventually take it only if the pain is bad.

## 2013-12-12 NOTE — Progress Notes (Signed)
Pre visit review using our clinic review tool, if applicable. No additional management support is needed unless otherwise documented below in the visit note. 

## 2013-12-12 NOTE — Assessment & Plan Note (Signed)
Better Mostly bursitis  Came to tell me I let her down by not suggesting she see another orthopedist when there was a delay getting in with [facility]

## 2013-12-17 ENCOUNTER — Other Ambulatory Visit: Payer: Self-pay

## 2013-12-17 MED ORDER — PAROXETINE HCL 10 MG PO TABS: ORAL_TABLET | ORAL | Status: DC

## 2013-12-17 NOTE — Telephone Encounter (Signed)
[facility] [address] left v/m requesting refill paroxetine 10 mg. Done.

## 2013-12-23 ENCOUNTER — Telehealth: Payer: Self-pay

## 2013-12-23 NOTE — Telephone Encounter (Signed)
Ok. Have her take every other day x 1 week, then every 3 days x 1 week, then stop. If she experiences any nervousness, jitteriness, palpitations or rebound depression, she should make an appt ASAP

## 2013-12-23 NOTE — Telephone Encounter (Signed)
Pt is aware as instructed 

## 2013-12-23 NOTE — Telephone Encounter (Signed)
Pt has been taking paxil 2 - 3 years for depression; pt has not felt depressed in quite some time (cannot find how long in months). Pt would like to stop paxil.pt presently taking paxil 10 mg at hs.Please advise.

## 2013-12-31 ENCOUNTER — Telehealth: Payer: Self-pay | Admitting: Internal Medicine

## 2013-12-31 NOTE — Telephone Encounter (Signed)
Pt called stating she is at [employer] wanting to buy a lift chair They told her she could save $100 if she had a rx for this  [employer] [employer] p [phone] f  [phone]   Please let pt know when this has been faxed

## 2014-01-01 NOTE — Telephone Encounter (Signed)
rx faxed and scanned

## 2014-01-01 NOTE — Telephone Encounter (Signed)
Rx written.

## 2014-01-16 ENCOUNTER — Telehealth: Payer: Self-pay

## 2014-01-16 DIAGNOSIS — M545 Low back pain: Secondary | ICD-10-CM

## 2014-01-16 NOTE — Telephone Encounter (Signed)
Pt left v/m; pt has questions on how to take meds; pt wants to speak with Dr Silvio Pate on phone or should pt schedule appt to see Dr Silvio Pate to talk with him about her meds.Please advise. Pt said she takes Mobic 15 mg one daily and alternates Tramadol and Hydrocodone-apap for moderate pain. Pt wants to know if Dr Silvio Pate wants her to continue taking the Mobic,Tramadol and Hydrocodone.

## 2014-01-16 NOTE — Telephone Encounter (Signed)
Please contact her to come in for these films at her convenience and provide copy for her

## 2014-01-16 NOTE — Telephone Encounter (Signed)
I spoke to the patient about coming in for x rays, she wants to wait and is scheduling an appt with Dr Silvio Pate.

## 2014-01-16 NOTE — Telephone Encounter (Signed)
Takes the mobic every morning Tends to get increasing hip pain as she is active during the day and this may occur by about noon. She is taking tramadol or hydrocodone. I told her to determine which of these is better, and we will stick with just the better one.

## 2014-01-16 NOTE — Telephone Encounter (Signed)
Pt left v/m pt wants back xray to be done to take to Dr Birdie Sons chiropractor; pt wants to know where can have back xray and xray be covered by medicare? Pt wants to know if there is a facility near [facility] or would pt come to [facility]? Pt said Dr Silvio Pate is aware of pts back problem. If pt has back xray at Dr Birdie Sons office pt will have to pay $75.00 and xray is not covered by medicare. Pt request cb.

## 2014-01-31 ENCOUNTER — Other Ambulatory Visit: Payer: Self-pay | Admitting: Internal Medicine

## 2014-01-31 NOTE — Telephone Encounter (Signed)
12-05-13

## 2014-01-31 NOTE — Telephone Encounter (Signed)
Spoke with patient and advised rx ready for pick-up and it will be at the front desk.  

## 2014-02-11 DIAGNOSIS — M76899 Other specified enthesopathies of unspecified lower limb, excluding foot: Secondary | ICD-10-CM | POA: Diagnosis not present

## 2014-02-11 DIAGNOSIS — M1610 Unilateral primary osteoarthritis, unspecified hip: Secondary | ICD-10-CM | POA: Diagnosis not present

## 2014-02-12 DIAGNOSIS — H2510 Age-related nuclear cataract, unspecified eye: Secondary | ICD-10-CM | POA: Diagnosis not present

## 2014-02-24 ENCOUNTER — Ambulatory Visit: Payer: Self-pay | Admitting: Specialist

## 2014-02-24 DIAGNOSIS — M169 Osteoarthritis of hip, unspecified: Secondary | ICD-10-CM | POA: Diagnosis not present

## 2014-02-24 DIAGNOSIS — M76899 Other specified enthesopathies of unspecified lower limb, excluding foot: Secondary | ICD-10-CM | POA: Diagnosis not present

## 2014-02-24 DIAGNOSIS — M1610 Unilateral primary osteoarthritis, unspecified hip: Secondary | ICD-10-CM | POA: Diagnosis not present

## 2014-02-26 DIAGNOSIS — M1610 Unilateral primary osteoarthritis, unspecified hip: Secondary | ICD-10-CM | POA: Diagnosis not present

## 2014-03-03 ENCOUNTER — Ambulatory Visit (INDEPENDENT_AMBULATORY_CARE_PROVIDER_SITE_OTHER): Payer: Medicare Other | Admitting: Internal Medicine

## 2014-03-03 ENCOUNTER — Other Ambulatory Visit: Payer: Self-pay | Admitting: *Deleted

## 2014-03-03 ENCOUNTER — Encounter: Payer: Self-pay | Admitting: Internal Medicine

## 2014-03-03 VITALS — BP 138/70 | HR 91 | Temp 98.3°F | Wt 166.0 lb

## 2014-03-03 DIAGNOSIS — M1611 Unilateral primary osteoarthritis, right hip: Secondary | ICD-10-CM | POA: Insufficient documentation

## 2014-03-03 DIAGNOSIS — M1610 Unilateral primary osteoarthritis, unspecified hip: Secondary | ICD-10-CM | POA: Diagnosis not present

## 2014-03-03 MED ORDER — HYDROCODONE-ACETAMINOPHEN 5-325 MG PO TABS: 1.0000 | ORAL_TABLET | Freq: Three times a day (TID) | ORAL | Status: DC | PRN

## 2014-03-03 MED ORDER — ALPRAZOLAM 0.25 MG PO TABS: 0.2500 mg | ORAL_TABLET | Freq: Every evening | ORAL | Status: DC | PRN

## 2014-03-03 NOTE — Telephone Encounter (Signed)
11/19/13 

## 2014-03-03 NOTE — Telephone Encounter (Signed)
rx called into pharmacy

## 2014-03-03 NOTE — Assessment & Plan Note (Signed)
MRI suggests inflammatory process Has appt for joint injection by radiologist on Thursday--urged her to keep this May want to consider trial of prednisone or appt with Dr Luiz Ochoa saw him many years ago

## 2014-03-03 NOTE — Progress Notes (Signed)
Pre visit review using our clinic review tool, if applicable. No additional management support is needed unless otherwise documented below in the visit note. 

## 2014-03-03 NOTE — Telephone Encounter (Signed)
Okay #100 x 0

## 2014-03-03 NOTE — Progress Notes (Signed)
Subjective:    Patient ID: Bridget Mendez, female    DOB: January 05, 1920, 80 y.o.   MRN: [id number]  HPI Ongoing right hip pain Does think the bursa cortisone shot helped a little-but not for very long First in May and then August  Went back to Dr Tamala Julian and got MRI Advanced arthritis seen Small effusion worrisome for inflammatory arthritis  Does have some arthritis in fingers but mild No other arthritis pain  No known injury or reason for the asymmetry  Does take the mobic Not being helped by hydrocodone Has to squirm to get comfortable in her recliner No help from advil or aleve when she has added this on  Current Outpatient Prescriptions on File Prior to Visit  Medication Sig Dispense Refill  . ALPRAZolam (XANAX) 0.25 MG tablet Take 1 tablet (0.25 mg total) by mouth at bedtime as needed.  100 tablet  0  . aspirin 81 MG tablet Take 81 mg by mouth daily.        . Calcium Carb-Cholecalciferol (CALCIUM 1000 + D PO) Take by mouth daily.        . Cholecalciferol (VITAMIN D) 1000 UNITS capsule Take 1,000 Units by mouth daily.        Marland Kitchen glucosamine-chondroitin 500-400 MG tablet Take 1 tablet by mouth daily.        Marland Kitchen HYDROcodone-acetaminophen (NORCO/VICODIN) 5-325 MG per tablet take 1 tablet by mouth every 6 hours if needed for MODERATE pain  60 tablet  0  . meloxicam (MOBIC) 15 MG tablet Take 15 mg by mouth daily.       . methylcellulose packet Take by mouth as needed.        . Multiple Vitamin (MULTIVITAMIN) tablet Take 1 tablet by mouth daily.        . Probiotic Product (PROBIOTIC DAILY) CAPS Take by mouth daily.       No current facility-administered medications on file prior to visit.    No Known Allergies  Past Medical History  Diagnosis Date  . GERD (gastroesophageal reflux disease)   . Osteoarthritis   . Osteoporosis   . Depression   . Allergy   . Anxiety     Past Surgical History  Procedure Laterality Date  . Cesarean section      x2  . Hemorrhoid surgery      . Knee arthroscopy  1998 & 2005    both knees  . Inguinal hernia repair  02/2009    Dr.Wilton Tamala Julian    Family History  Problem Relation Age of Onset  . Parkinsonism Mother   . Cancer Maternal Grandmother     History   Social History  . Marital Status: Married    Spouse Name: N/A    Number of Children: 2  . Years of Education: N/A   Occupational History  . retired    Social History Main Topics  . Smoking status: Former Smoker -- 1.00 packs/day    Types: Cigarettes    Quit date: 07/11/1988  . Smokeless tobacco: Never Used  . Alcohol Use: Yes     Comment: 2 drinks in the evening  . Drug Use: No  . Sexual Activity: Not on file   Other Topics Concern  . Not on file   Social History Narrative   Has living will   Daughter, [name], is health care POA   Has DNR already--reviewed   No tube feeds if cognitively aware         Review of Systems  No fever Appetite is still fine Bowels are okay Still with urinary frequency-- mostly at night    Objective:   Physical Exam  Constitutional: She appears well-developed and well-nourished. No distress.  Musculoskeletal:  Needs help getting up on table Marked restriction in internal rotation of right hip No bursa tenderness Left hip okay           Assessment & Plan:

## 2014-03-06 ENCOUNTER — Ambulatory Visit: Payer: Self-pay | Admitting: Specialist

## 2014-03-06 DIAGNOSIS — M25559 Pain in unspecified hip: Secondary | ICD-10-CM | POA: Diagnosis not present

## 2014-03-10 ENCOUNTER — Telehealth: Payer: Self-pay | Admitting: *Deleted

## 2014-03-10 NOTE — Telephone Encounter (Signed)
Patient left a voice mail wanting to let you know that she got an injection in her right hip and doing 90% better. Patient just wanted to let you know this.

## 2014-03-10 NOTE — Telephone Encounter (Signed)
Good to hear--I thought that might help

## 2014-04-08 ENCOUNTER — Telehealth: Payer: Self-pay

## 2014-04-08 NOTE — Telephone Encounter (Signed)
Pt request med for cough that has codeine in it before end of day today.non productive cough and runny nose started on 04/06/14; no fever. [facility] [address] Pt request cb.

## 2014-04-08 NOTE — Telephone Encounter (Signed)
Okay to send Rx for robitussin with codeine 5-20ml at bedtime prn severe cough I don't recommend using during the day due to sedation and instability #71ml x 0 If cough persists, she needs appt

## 2014-04-09 ENCOUNTER — Other Ambulatory Visit: Payer: Self-pay

## 2014-04-09 MED ORDER — GUAIFENESIN-CODEINE 100-10 MG/5ML PO SOLN: ORAL | Status: DC

## 2014-04-09 NOTE — Telephone Encounter (Signed)
Medication  For robitussin with codeine called in to pharmacy per patient's request. Printed Rx placed in shedded box.

## 2014-04-09 NOTE — Telephone Encounter (Signed)
Spoke with patient to notify her of Dr. Alla German comments. Patient verbalized understanding. Patient will pick up Rx today from front desk staff.

## 2014-04-15 ENCOUNTER — Encounter: Payer: Self-pay | Admitting: Internal Medicine

## 2014-04-15 ENCOUNTER — Ambulatory Visit (INDEPENDENT_AMBULATORY_CARE_PROVIDER_SITE_OTHER): Payer: Medicare Other | Admitting: Internal Medicine

## 2014-04-15 VITALS — BP 132/78 | HR 82 | Temp 98.0°F | Resp 16 | Wt 165.5 lb

## 2014-04-15 DIAGNOSIS — M1990 Unspecified osteoarthritis, unspecified site: Secondary | ICD-10-CM

## 2014-04-15 DIAGNOSIS — M1611 Unilateral primary osteoarthritis, right hip: Secondary | ICD-10-CM

## 2014-04-15 NOTE — Progress Notes (Signed)
Pre visit review using our clinic review tool, if applicable. No additional management support is needed unless otherwise documented below in the visit note. 

## 2014-04-15 NOTE — Progress Notes (Signed)
Subjective:    Patient ID: Bridget Mendez, female    DOB: 29-Dec-1919, 80 y.o.   MRN: [id number]  HPI Did get significant relief from right hip injection--but it went away after a couple of weeks Now with fairly severe disability again Hard to get up Lots of pain getting around Afraid of getting surgery Trouble even lifting the leg to get in and out of the car Hard to move the leg to push the brake pedal  Pain meds have really not helped Aleve and hydrocodone not really any help  Current Outpatient Prescriptions on File Prior to Visit  Medication Sig Dispense Refill  . ALPRAZolam (XANAX) 0.25 MG tablet Take 1 tablet (0.25 mg total) by mouth at bedtime as needed.  100 tablet  0  . aspirin 81 MG tablet Take 81 mg by mouth daily.        . Calcium Carb-Cholecalciferol (CALCIUM 1000 + D PO) Take by mouth daily.        . Cholecalciferol (VITAMIN D) 1000 UNITS capsule Take 1,000 Units by mouth daily.        Marland Kitchen glucosamine-chondroitin 500-400 MG tablet Take 1 tablet by mouth daily.        Marland Kitchen guaiFENesin-codeine 100-10 MG/5ML syrup Take 5-33ml at bedtime as needed.  60 mL  0  . HYDROcodone-acetaminophen (NORCO/VICODIN) 5-325 MG per tablet Take 1 tablet by mouth 3 (three) times daily as needed for moderate pain.  60 tablet  0  . meloxicam (MOBIC) 15 MG tablet Take 15 mg by mouth daily.       . methylcellulose packet Take by mouth as needed.        . Multiple Vitamin (MULTIVITAMIN) tablet Take 1 tablet by mouth daily.        . Probiotic Product (PROBIOTIC DAILY) CAPS Take by mouth daily.       No current facility-administered medications on file prior to visit.    No Known Allergies  Past Medical History  Diagnosis Date  . GERD (gastroesophageal reflux disease)   . Osteoarthritis   . Osteoporosis   . Depression   . Allergy   . Anxiety     Past Surgical History  Procedure Laterality Date  . Cesarean section      x2  . Hemorrhoid surgery    . Knee arthroscopy  1998 & 2005   both knees  . Inguinal hernia repair  02/2009    Dr.Wilton Tamala Julian    Family History  Problem Relation Age of Onset  . Parkinsonism Mother   . Cancer Maternal Grandmother     History   Social History  . Marital Status: Married    Spouse Name: N/A    Number of Children: 2  . Years of Education: N/A   Occupational History  . retired    Social History Main Topics  . Smoking status: Former Smoker -- 1.00 packs/day    Types: Cigarettes    Quit date: 07/11/1988  . Smokeless tobacco: Never Used  . Alcohol Use: Yes     Comment: 2 drinks in the evening  . Drug Use: No  . Sexual Activity: Not on file   Other Topics Concern  . Not on file   Social History Narrative   Has living will   Daughter, [name], is health care POA   Has DNR already--reviewed   No tube feeds if cognitively aware         Review of Systems Remains independent Eating okay Does sleep reasonably  well--despite frequent nocturia    Objective:   Physical Exam        Assessment & Plan:

## 2014-04-15 NOTE — Assessment & Plan Note (Addendum)
Brief significant improvement with the cortisone shot but now as bad as ever She is generally healthy---so despite her age, she should consider THR. Wants the most experienced surgeon for this in [place]---will check with Dr Lorelei Pont for his recommendation  Counseled all of 15 minute visit  Reviewed with Dr Lorelei Pont Dr Marry Guan is only fellowship trained joint replacement surgeon in [place] so that is who he (and I) would recommend She already has an appt with him in November--and will try to get it moved up

## 2014-04-21 DIAGNOSIS — L821 Other seborrheic keratosis: Secondary | ICD-10-CM | POA: Diagnosis not present

## 2014-04-21 DIAGNOSIS — D485 Neoplasm of uncertain behavior of skin: Secondary | ICD-10-CM | POA: Diagnosis not present

## 2014-04-21 DIAGNOSIS — D229 Melanocytic nevi, unspecified: Secondary | ICD-10-CM | POA: Diagnosis not present

## 2014-04-21 DIAGNOSIS — Z1283 Encounter for screening for malignant neoplasm of skin: Secondary | ICD-10-CM | POA: Diagnosis not present

## 2014-04-21 DIAGNOSIS — Z85820 Personal history of malignant melanoma of skin: Secondary | ICD-10-CM | POA: Diagnosis not present

## 2014-04-21 DIAGNOSIS — L578 Other skin changes due to chronic exposure to nonionizing radiation: Secondary | ICD-10-CM | POA: Diagnosis not present

## 2014-04-21 DIAGNOSIS — L820 Inflamed seborrheic keratosis: Secondary | ICD-10-CM | POA: Diagnosis not present

## 2014-04-25 DIAGNOSIS — M1611 Unilateral primary osteoarthritis, right hip: Secondary | ICD-10-CM | POA: Diagnosis not present

## 2014-04-28 ENCOUNTER — Telehealth: Payer: Self-pay

## 2014-04-28 NOTE — Telephone Encounter (Signed)
Pt left v/m; pt has disc with MRI and wants Dr Silvio Pate to review the MRI; pt also wants to speak with Dr Silvio Pate after he reviews disc about pt's upcoming surgery. Does pt need to schedule appt?

## 2014-04-28 NOTE — Telephone Encounter (Signed)
Message left Reviewed Dr Jerald Kief note I think she needs to proceed with the THR---I don't need to see the MRI (It confirmed the severe arthritis that the x-ray showed)

## 2014-04-28 NOTE — Telephone Encounter (Signed)
Reached her and discussed this She is going to get another injection, then go back to Dr Esperanza Richters and set a date for [facility]

## 2014-05-01 DIAGNOSIS — Z23 Encounter for immunization: Secondary | ICD-10-CM | POA: Diagnosis not present

## 2014-05-06 ENCOUNTER — Telehealth: Payer: Self-pay

## 2014-05-06 NOTE — Telephone Encounter (Signed)
Duplicate/error

## 2014-05-06 NOTE — Telephone Encounter (Signed)
Pt left v/m; pt getting up every 1 1/2 - 2 hours at night urinating; pt does not have frequency of urine during the day. Pt request med to decrease # of times pt has to get up during the night to urinate. Pt will be available after 3 PM today for cb. [place].

## 2014-05-06 NOTE — Telephone Encounter (Signed)
Overactive bladder usually isn't only at night---and meds for this have frequent side effects. It may be that she should try drinking less after 5PM and avoiding caffeine if that is applicable. If she is swelling some during the day, the fluid will hit her kidneys at night and make her go. If this is the case, keeping her legs up during the day may help some  Make sure there is no dysuria or hematuria. If this persists and she wants to consider meds, I would need to see her in office to discuss the options I am reluctant to start something with hip surgery probably coming up soon

## 2014-05-07 NOTE — Telephone Encounter (Signed)
Spoke with patient and advised results, she's not having any swelling and will try drinking less after 5  Pt states shes going to see the surgeon on 11/6 and possibly set the date for surgery, she just wanted to let you know.

## 2014-05-07 NOTE — Telephone Encounter (Signed)
okay

## 2014-05-09 ENCOUNTER — Other Ambulatory Visit: Payer: Self-pay

## 2014-05-09 NOTE — Telephone Encounter (Signed)
Pt left v/m requesting rx hydrocodone apap. Call when ready for pick up. Pt wants to pick up rx on 05/12/14.

## 2014-05-12 MED ORDER — HYDROCODONE-ACETAMINOPHEN 5-325 MG PO TABS: 1.0000 | ORAL_TABLET | Freq: Three times a day (TID) | ORAL | Status: DC | PRN

## 2014-05-12 NOTE — Telephone Encounter (Signed)
Spoke with patient and advised rx ready for pick-up and it will be at the front desk.  

## 2014-05-15 ENCOUNTER — Telehealth: Payer: Self-pay

## 2014-05-15 NOTE — Telephone Encounter (Signed)
Spoke w/Christina @ ext [phone] in billing and she is sending this back to coding review.  I notified pt and advised it could take up to 60+ days.

## 2014-05-15 NOTE — Telephone Encounter (Signed)
Pt left v/m wanting to know why pneumonia booster shot was not covered by medicare. Pt wants to know if coding problem. Pt request cb.

## 2014-05-16 DIAGNOSIS — M1611 Unilateral primary osteoarthritis, right hip: Secondary | ICD-10-CM | POA: Diagnosis not present

## 2014-05-22 ENCOUNTER — Telehealth: Payer: Self-pay

## 2014-05-22 ENCOUNTER — Other Ambulatory Visit: Payer: Self-pay | Admitting: Orthopaedic Surgery

## 2014-05-22 NOTE — Telephone Encounter (Signed)
Pt left v/m; requested alprazolam refill on 05/20/14 from pharmacy and refill has not been done; pt request status of alprazolam refill. Pt request cb.

## 2014-05-23 MED ORDER — ALPRAZOLAM 0.25 MG PO TABS: 0.2500 mg | ORAL_TABLET | Freq: Every evening | ORAL | Status: DC | PRN

## 2014-05-23 NOTE — Telephone Encounter (Signed)
Okay #100 x 0

## 2014-05-23 NOTE — Telephone Encounter (Signed)
rx called into pharmacy

## 2014-05-29 MED ORDER — ALPRAZOLAM 0.25 MG PO TABS: 0.2500 mg | ORAL_TABLET | Freq: Every evening | ORAL | Status: DC | PRN

## 2014-05-29 NOTE — Telephone Encounter (Signed)
Okay to change instructions and let pharmacy know it is okay to dispense Apologize for any inconvenience

## 2014-05-29 NOTE — Telephone Encounter (Signed)
Spoke with pharmacist at [facility] and advised change

## 2014-05-29 NOTE — Addendum Note (Signed)
Addended by: Despina Hidden on: 05/29/2014 04:37 PM   Modules accepted: Orders, Medications

## 2014-05-29 NOTE — Telephone Encounter (Signed)
Pt request change of alprazolam instructions to read 1 - 2 tabs by mouth at bedtime as needed; pt said the refill done on 05/23/14 can not be picked up until 06/01/14 with the present instructions 1 tab by mouth at bedtime prn. Previous instructions were 1 - 2 tabs. Pt request cb.

## 2014-06-09 ENCOUNTER — Ambulatory Visit: Payer: Self-pay | Admitting: Specialist

## 2014-06-09 DIAGNOSIS — M25551 Pain in right hip: Secondary | ICD-10-CM | POA: Diagnosis not present

## 2014-06-09 DIAGNOSIS — Z7982 Long term (current) use of aspirin: Secondary | ICD-10-CM | POA: Diagnosis not present

## 2014-06-09 DIAGNOSIS — Z79899 Other long term (current) drug therapy: Secondary | ICD-10-CM | POA: Diagnosis not present

## 2014-06-09 DIAGNOSIS — M1611 Unilateral primary osteoarthritis, right hip: Secondary | ICD-10-CM | POA: Diagnosis not present

## 2014-06-13 ENCOUNTER — Telehealth: Payer: Self-pay

## 2014-06-13 MED ORDER — HYDROCODONE-ACETAMINOPHEN 5-325 MG PO TABS: 1.0000 | ORAL_TABLET | Freq: Three times a day (TID) | ORAL | Status: DC | PRN

## 2014-06-13 NOTE — Telephone Encounter (Signed)
Pt left v/m requesting rx hydrocodone apap. Call when ready for pick up. Pt would like to pick up next week.

## 2014-06-16 NOTE — Telephone Encounter (Signed)
Spoke with patient and advised rx ready for pick-up and it will be at the front desk.  

## 2014-06-18 ENCOUNTER — Telehealth: Payer: Self-pay | Admitting: *Deleted

## 2014-06-18 MED ORDER — HYDROCODONE-ACETAMINOPHEN 5-325 MG PO TABS: 1.0000 | ORAL_TABLET | Freq: Three times a day (TID) | ORAL | Status: DC | PRN

## 2014-06-18 NOTE — Telephone Encounter (Signed)
Please contact her The written prescription rule is from the state, not me  See if you can smooth things out. She has a history of getting upset at people.

## 2014-06-18 NOTE — Telephone Encounter (Signed)
Was changed to #90

## 2014-06-18 NOTE — Telephone Encounter (Signed)
Gave patient new rx with more quantity and she was VERY UPSET that she has to come pick this prescription up every month. She had a female friend with her and he started in on me also, they both said it doesn't make sense for her to come her every month to pick this rx up. She would like for him to pick this up every month and also a neighbor that's waiting in the waiting room said she could come pick this up. She disrupted the entire waiting room about picking up this rx. I tried explaining about the UDS that may have to be done and she stated that's worthless unless we tell her when it needs to be done. I also advised she may want to let her surgeon know when she needs more, pt states this medication doesn't work and she would like something stronger to knock out the pain completely. The female that was with her explained that he goes and picks up other controlled medications without this much hassle, he also stated he will bring a letter stating he can pick this up every month. Pt states she doesn't feel like coming her every month for this rx and I explained that he increased the quantity and per pt she take 3-4 per day not as needed. I was very nice and smiled as hard as I could but told her I could not make any decisions.

## 2014-06-18 NOTE — Telephone Encounter (Signed)
We had a pleasant conversation.  I explained that she could have anyone pick up her prescription for her.  This made her very happy.  She was also happy to get 90 pills as opposed to the 60 Hydrocodone she had been getting per month.

## 2014-06-18 NOTE — Telephone Encounter (Signed)
Pt left v/m requesting hydrocodone apap rx changed to larger quantity; pt said she takes 3 - 4 pills per day. Pt request cb when new rx is ready for pick up. Pt request to pick up rx today.

## 2014-06-19 NOTE — Telephone Encounter (Signed)
Thanks

## 2014-07-09 NOTE — Pre-Procedure Instructions (Addendum)
[place]  07/09/2014   Your procedure is scheduled on: Tuesday, Jan. 12th    Report to [facility] Admitting at 10:40 AM.  Call this number if you have problems the morning of surgery: [phone]   Remember:   Do not eat food or drink liquids after midnight Monday.   Take these medicines the morning of surgery with A SIP OF WATER: Xanax, Hydrocodone   Do not wear jewelry, make-up or nail polish.  Do not wear lotions, powders, or perfumes. You may NOT wear deodorant the morning of surgery.  Do not shave underarms & legs 48 hours prior to surgery.    Do not bring valuables to the hospital.  [facility] is not responsible for any belongings or valuables.               Contacts, dentures or bridgework may not be worn into surgery.  Leave suitcase in the car. After surgery it may be brought to your room.  For patients admitted to the hospital, discharge time is determined by your treatment team.    Name and phone number of your driver:    Special Instructions: "Preparing for Surgery" instruction sheet.   Please read over the following fact sheets that you were given: Pain Booklet, Coughing and Deep Breathing, Blood Transfusion Information, MRSA Information and Surgical Site Infection Prevention

## 2014-07-10 ENCOUNTER — Other Ambulatory Visit: Payer: Self-pay

## 2014-07-10 ENCOUNTER — Encounter (HOSPITAL_COMMUNITY): Payer: Self-pay

## 2014-07-10 ENCOUNTER — Encounter (HOSPITAL_COMMUNITY)
Admission: RE | Admit: 2014-07-10 | Discharge: 2014-07-10 | Disposition: A | Discharge status: Home / Self Care | Payer: Medicare Other | Source: Ambulatory Visit | Attending: Orthopaedic Surgery | Admitting: Orthopaedic Surgery

## 2014-07-10 DIAGNOSIS — M8588 Other specified disorders of bone density and structure, other site: Secondary | ICD-10-CM | POA: Insufficient documentation

## 2014-07-10 DIAGNOSIS — Z79899 Other long term (current) drug therapy: Secondary | ICD-10-CM | POA: Insufficient documentation

## 2014-07-10 DIAGNOSIS — Z01818 Encounter for other preprocedural examination: Secondary | ICD-10-CM | POA: Insufficient documentation

## 2014-07-10 DIAGNOSIS — Z01810 Encounter for preprocedural cardiovascular examination: Secondary | ICD-10-CM | POA: Diagnosis not present

## 2014-07-10 DIAGNOSIS — J984 Other disorders of lung: Secondary | ICD-10-CM | POA: Diagnosis not present

## 2014-07-10 DIAGNOSIS — M1611 Unilateral primary osteoarthritis, right hip: Secondary | ICD-10-CM | POA: Diagnosis not present

## 2014-07-10 DIAGNOSIS — I517 Cardiomegaly: Secondary | ICD-10-CM | POA: Diagnosis not present

## 2014-07-10 DIAGNOSIS — R918 Other nonspecific abnormal finding of lung field: Secondary | ICD-10-CM | POA: Diagnosis not present

## 2014-07-10 DIAGNOSIS — Z87891 Personal history of nicotine dependence: Secondary | ICD-10-CM | POA: Insufficient documentation

## 2014-07-10 DIAGNOSIS — Z01812 Encounter for preprocedural laboratory examination: Secondary | ICD-10-CM | POA: Insufficient documentation

## 2014-07-10 DIAGNOSIS — M810 Age-related osteoporosis without current pathological fracture: Secondary | ICD-10-CM | POA: Insufficient documentation

## 2014-07-10 DIAGNOSIS — F329 Major depressive disorder, single episode, unspecified: Secondary | ICD-10-CM | POA: Diagnosis not present

## 2014-07-10 DIAGNOSIS — K219 Gastro-esophageal reflux disease without esophagitis: Secondary | ICD-10-CM | POA: Insufficient documentation

## 2014-07-10 DIAGNOSIS — I252 Old myocardial infarction: Secondary | ICD-10-CM | POA: Diagnosis not present

## 2014-07-10 DIAGNOSIS — F419 Anxiety disorder, unspecified: Secondary | ICD-10-CM | POA: Insufficient documentation

## 2014-07-10 DIAGNOSIS — M25551 Pain in right hip: Secondary | ICD-10-CM | POA: Diagnosis not present

## 2014-07-10 DIAGNOSIS — Z7982 Long term (current) use of aspirin: Secondary | ICD-10-CM | POA: Insufficient documentation

## 2014-07-10 LAB — ABO/RH: ABO/RH(D): O POS

## 2014-07-10 LAB — CBC WITH DIFFERENTIAL/PLATELET
Basophils Absolute: 0 10*3/uL (ref 0.0–0.1)
Basophils Relative: 0 % (ref 0–1)
Eosinophils Absolute: 0.1 10*3/uL (ref 0.0–0.7)
Eosinophils Relative: 2 % (ref 0–5)
HCT: 38.3 % (ref 36.0–46.0)
Hemoglobin: 12.4 g/dL (ref 12.0–15.0)
Lymphocytes Relative: 27 % (ref 12–46)
Lymphs Abs: 1.9 10*3/uL (ref 0.7–4.0)
MCH: 30.8 pg (ref 26.0–34.0)
MCHC: 32.4 g/dL (ref 30.0–36.0)
MCV: 95 fL (ref 78.0–100.0)
Monocytes Absolute: 0.8 10*3/uL (ref 0.1–1.0)
Monocytes Relative: 11 % (ref 3–12)
NEUTROS PCT: 60 % (ref 43–77)
Neutro Abs: 4.3 10*3/uL (ref 1.7–7.7)
Platelets: 412 10*3/uL — ABNORMAL HIGH (ref 150–400)
RBC: 4.03 MIL/uL (ref 3.87–5.11)
RDW: 14.1 % (ref 11.5–15.5)
WBC: 7.2 10*3/uL (ref 4.0–10.5)

## 2014-07-10 LAB — TYPE AND SCREEN
ABO/RH(D): O POS
Antibody Screen: NEGATIVE

## 2014-07-10 LAB — BASIC METABOLIC PANEL
Anion gap: 11 (ref 5–15)
BUN: 24 mg/dL — AB (ref 6–23)
CO2: 25 mmol/L (ref 19–32)
Calcium: 9.5 mg/dL (ref 8.4–10.5)
Chloride: 101 mEq/L (ref 96–112)
Creatinine, Ser: 1 mg/dL (ref 0.50–1.10)
GFR, EST AFRICAN AMERICAN: 55 mL/min — AB (ref >90)
GFR, EST NON AFRICAN AMERICAN: 47 mL/min — AB (ref >90)
Glucose, Bld: 94 mg/dL (ref 70–99)
Potassium: 4.3 mmol/L (ref 3.5–5.1)
SODIUM: 137 mmol/L (ref 135–145)

## 2014-07-10 LAB — URINALYSIS, ROUTINE W REFLEX MICROSCOPIC
Bilirubin Urine: NEGATIVE
GLUCOSE, UA: NEGATIVE mg/dL
HGB URINE DIPSTICK: NEGATIVE
Ketones, ur: NEGATIVE mg/dL
Nitrite: NEGATIVE
Protein, ur: NEGATIVE mg/dL
SPECIFIC GRAVITY, URINE: 1.018 (ref 1.005–1.030)
UROBILINOGEN UA: 0.2 mg/dL (ref 0.0–1.0)
pH: 5.5 (ref 5.0–8.0)

## 2014-07-10 LAB — PROTIME-INR
INR: 0.99 (ref 0.00–1.49)
PROTHROMBIN TIME: 13.2 s (ref 11.6–15.2)

## 2014-07-10 LAB — URINE MICROSCOPIC-ADD ON

## 2014-07-10 LAB — SURGICAL PCR SCREEN
MRSA, PCR: NEGATIVE
Staphylococcus aureus: NEGATIVE

## 2014-07-10 LAB — APTT: aPTT: 30 seconds (ref 24–37)

## 2014-07-14 NOTE — Progress Notes (Addendum)
Anesthesia Chart Review:  Patient is a 81 year old female scheduled for right THA, anterior approach on 07/22/14 by Dr. Rhona Raider. Case is posted for SPINAL anesthesia.  History includes former smoker, GERD, osteoporosis, depression, anxiety. PCP is Dr. Silvio Pate with [facility] who is aware of plans for surgery and agrees with plans to proceed with THR (see telephone encounter 04/28/14).  Meds: Xanax, calcium, Vitamin D, Norco, MVI, Probiotic, ASA 81 mg, glucosamine-chondrotin, guaifenesin, methylcellulose.  07/10/14 EKG: NSR, LAD, minimal voltage criteria for LVH, may be normal variant, septal infarct (age undetermined). Appearance of septal infarct is new when compared to 03/20/06 EKG, LAD is not. No CV symptoms were documented from her PAT visit.  07/10/14 CXR: 1. Findings suggesting COPD with bibasilar pleural parenchymal scarring. 2. Mild cardiomegaly, normal pulmonary vascularity.  Preoperative labs noted. UA was sent but urine culture was note "collected" from her PAT visit--Kathy Blume at Dr. Jerald Kief office notified in case Dr. Rhona Raider wanted the urine culture sent prior to the day of surgery.    Patient is advanced in age and a former smoker, but otherwise no reported HTN, DM, CAD, CHF history.  No prior stress/echo noted in Epic.  In October, her PCP was in agreement with THR.  Discussed with anesthesiologist Dr. Deatra Canter.  If patient without CV symptoms then it is anticipated that she can proceed as planned.  (No CV symptoms from PAT, but I have called and left a voice message for patient to call me so I can confirm.)  George Hugh [facility] Stay [facility]/Anesthesiology Phone [phone] 07/14/2014 5:05 PM  Addendum:  Patient called back.  She reports Dr. Jerald Kief office is starting her on Cipro for her abnormal UA.  She confirms that she would like spinal anesthesia. She feels she will recover better. She has had previous spinal and/or epidural anesthesia in  the past without difficulty except remembering being cold and "too aware" during one procedure.  She reports she is an anxious person and anticipates she will need something to help calm her nerves.  I did explain that she may have some awareness if she is not under general anesthesia.  She denies prior back surgeries, no known CAD/valvular disease or murmur history, no bleeding disorder.  She says her hip hurts if lying flat but otherwise can lie flat without SOB.  She denies SOB, chest pain, edema.  She had a brief episode (lasting only a few seconds) where she felt dizzy last week when feeling emotional overwhelmed, but no syncope, visual disturbances, weakness, or dysarthria.  Dr. Rhona Raider has not asked her to hold her ASA 81 mg.  She goes to PT at [facility] in [place], but otherwise requires a rolling walker due to severe hip pain.    She confirms no CV symptoms.  I do not see any absolute contraindications for spinal anesthesia; however, she will need further evaluation by her assigned anesthesiologist to determine definitive anesthesia plan.   George Hugh [facility]/Anesthesiology Phone [phone] 07/15/2014 9:56 AM

## 2014-07-18 NOTE — H&P (Signed)
TOTAL HIP ADMISSION H&P  Patient is admitted for right total hip arthroplasty.  Subjective:  Chief Complaint: right hip pain  HPI: Bridget Mendez, 81 y.o. female, has a history of pain and functional disability in the right hip(s) due to arthritis and patient has failed non-surgical conservative treatments for greater than 12 weeks to include NSAID's and/or analgesics, corticosteriod injections, flexibility and strengthening excercises, use of assistive devices, weight reduction as appropriate and activity modification.  Onset of symptoms was gradual starting 5 years ago with gradually worsening course since that time.The patient noted no past surgery on the right hip(s).  Patient currently rates pain in the right hip at 10 out of 10 with activity. Patient has night pain, worsening of pain with activity and weight bearing, trendelenberg gait, pain that interfers with activities of daily living and crepitus. Patient has evidence of subchondral cysts, subchondral sclerosis, periarticular osteophytes and joint space narrowing by imaging studies. This condition presents safety issues increasing the risk of falls.  There is no current active infection.  Patient Active Problem List   Diagnosis Date Noted  . Arthritis of right hip 03/03/2014  . Neuroma digital nerve 04/18/2013  . Routine general medical examination at a health care facility 10/17/2011  . IBS (irritable bowel syndrome) 12/13/2010  . ANXIETY 04/28/2010  . ALLERGIC RHINITIS 09/15/2008  . GERD 10/24/2006  . OSTEOARTHRITIS 10/24/2006  . OSTEOPOROSIS 10/24/2006  . SLEEP DISORDER 10/24/2006   Past Medical History  Diagnosis Date  . GERD (gastroesophageal reflux disease)   . Osteoarthritis   . Osteoporosis   . Depression   . Allergy   . Anxiety     Past Surgical History  Procedure Laterality Date  . Cesarean section      x2  . Hemorrhoid surgery    . Knee arthroscopy  1998 & 2005    both knees  . Inguinal hernia repair   02/2009    Dr.Wilton Tamala Julian    No prescriptions prior to admission   No Known Allergies  History  Substance Use Topics  . Smoking status: Former Smoker -- 1.00 packs/day    Types: Cigarettes    Quit date: 07/11/1988  . Smokeless tobacco: Never Used  . Alcohol Use: Yes     Comment: 2 drinks in the evening    Family History  Problem Relation Age of Onset  . Parkinsonism Mother   . Cancer Maternal Grandmother      Review of Systems  Musculoskeletal: Positive for joint pain.       Right hip  All other systems reviewed and are negative.   Objective:  Physical Exam  Constitutional: She is oriented to person, place, and time. She appears well-developed and well-nourished.  HENT:  Head: Normocephalic and atraumatic.  Eyes: Conjunctivae are normal. Pupils are equal, round, and reactive to light.  Neck: Normal range of motion.  Cardiovascular: Normal rate and regular rhythm.   Respiratory: Effort normal.  GI: Soft.  Musculoskeletal:  Right hip motion is very painful. She has terrible pain to internal rotation. She has about a 10 hip flexion contracture. Opposite hip moves fully. Sensation and motor function are intact in both feet with palpable pulses on both sides. There is no palpable lymphadenopathy at her groin.   Neurological: She is alert and oriented to person, place, and time.  Skin: Skin is warm and dry.  Psychiatric: She has a normal mood and affect. Her behavior is normal. Judgment and thought content normal.    Vital signs  in last 24 hours:    Labs:   Estimated body mass index is 28.39 kg/(m^2) as calculated from the following:   Height as of 04/18/13: 5\' 4"  (1.626 m).   Weight as of 04/15/14: 75.07 kg (165 lb 8 oz).   Imaging Review Plain radiographs demonstrate severe degenerative joint disease of the right hip(s). The bone quality appears to be good for age and reported activity level.  Assessment/Plan:  End stage primary arthritis, right hip(s)  The  patient history, physical examination, clinical judgement of the provider and imaging studies are consistent with end stage degenerative joint disease of the right hip(s) and total hip arthroplasty is deemed medically necessary. The treatment options including medical management, injection therapy, arthroscopy and arthroplasty were discussed at length. The risks and benefits of total hip arthroplasty were presented and reviewed. The risks due to aseptic loosening, infection, stiffness, dislocation/subluxation,  thromboembolic complications and other imponderables were discussed.  The patient acknowledged the explanation, agreed to proceed with the plan and consent was signed. Patient is being admitted for inpatient treatment for surgery, pain control, PT, OT, prophylactic antibiotics, VTE prophylaxis, progressive ambulation and ADL's and discharge planning.The patient is planning to be discharged to skilled nursing facility

## 2014-07-21 ENCOUNTER — Other Ambulatory Visit: Payer: Self-pay

## 2014-07-21 ENCOUNTER — Telehealth: Payer: Self-pay

## 2014-07-21 MED ORDER — ALPRAZOLAM 0.25 MG PO TABS: 0.2500 mg | ORAL_TABLET | Freq: Every day | ORAL | Status: DC

## 2014-07-21 MED ORDER — HYDROCODONE-ACETAMINOPHEN 5-325 MG PO TABS: 1.0000 | ORAL_TABLET | Freq: Three times a day (TID) | ORAL | Status: DC | PRN

## 2014-07-21 NOTE — Telephone Encounter (Signed)
Pt left another v/m and does want to pick up hydrocodone apap rx on 07/22/14.Please advise.

## 2014-07-21 NOTE — Telephone Encounter (Signed)
Phone in alprazolam Tell her I am sorry about her surgery being postponed---hopefully she can get it done very soon

## 2014-07-21 NOTE — Telephone Encounter (Signed)
Pt left v/m; pts surgery has been cancelled for 07/22/14 due to surgeon being sick.

## 2014-07-21 NOTE — Telephone Encounter (Signed)
Oh, too bad Hopefully it will be done soon

## 2014-07-21 NOTE — Telephone Encounter (Signed)
Pt left v/m; pt scheduled to have hip surgery at [facility] on 07/22/14 and pt wanted Dr Silvio Pate to be aware.

## 2014-07-21 NOTE — Telephone Encounter (Signed)
Pt left v/m requesting rx hydrocodone apap and alprazolam.Call when ready for pick up.

## 2014-07-21 NOTE — Telephone Encounter (Signed)
Good for her I think she will get tremendous relief Will plan to see her for short term rehab

## 2014-07-22 NOTE — Telephone Encounter (Signed)
Spoke with patient and advised rx ready for pick-up and it will be at the front desk. rx called into pharmacy

## 2014-07-28 ENCOUNTER — Encounter (HOSPITAL_COMMUNITY): Payer: Self-pay | Admitting: *Deleted

## 2014-07-28 MED ORDER — LACTATED RINGERS IV SOLN
INTRAVENOUS | Status: DC
  Administered 2014-07-29 (×2): via INTRAVENOUS

## 2014-07-28 MED ORDER — CEFAZOLIN SODIUM-DEXTROSE 2-3 GM-% IV SOLR
2.0000 g | INTRAVENOUS | Status: AC
  Administered 2014-07-29: 2 g via INTRAVENOUS
  Filled 2014-07-28: qty 50

## 2014-07-29 ENCOUNTER — Inpatient Hospital Stay (HOSPITAL_COMMUNITY): Payer: Medicare Other | Admitting: Vascular Surgery

## 2014-07-29 ENCOUNTER — Inpatient Hospital Stay (HOSPITAL_COMMUNITY)
Admission: RE | Admit: 2014-07-29 | Discharge: 2014-07-31 | DRG: 470 | Disposition: A | Discharge status: Skilled Nursing Facility | Payer: Medicare Other | Source: Ambulatory Visit | Attending: Orthopaedic Surgery | Admitting: Orthopaedic Surgery

## 2014-07-29 ENCOUNTER — Inpatient Hospital Stay (HOSPITAL_COMMUNITY): Payer: Medicare Other | Admitting: Anesthesiology

## 2014-07-29 ENCOUNTER — Encounter (HOSPITAL_COMMUNITY): Payer: Self-pay | Admitting: *Deleted

## 2014-07-29 ENCOUNTER — Inpatient Hospital Stay (HOSPITAL_COMMUNITY): Payer: Medicare Other

## 2014-07-29 ENCOUNTER — Encounter (HOSPITAL_COMMUNITY)
Admission: RE | Disposition: A | Discharge status: Skilled Nursing Facility | Payer: Self-pay | Source: Ambulatory Visit | Attending: Orthopaedic Surgery

## 2014-07-29 DIAGNOSIS — M1611 Unilateral primary osteoarthritis, right hip: Principal | ICD-10-CM | POA: Diagnosis present

## 2014-07-29 DIAGNOSIS — K589 Irritable bowel syndrome without diarrhea: Secondary | ICD-10-CM | POA: Diagnosis present

## 2014-07-29 DIAGNOSIS — Z87891 Personal history of nicotine dependence: Secondary | ICD-10-CM

## 2014-07-29 DIAGNOSIS — M810 Age-related osteoporosis without current pathological fracture: Secondary | ICD-10-CM | POA: Diagnosis present

## 2014-07-29 DIAGNOSIS — Z96641 Presence of right artificial hip joint: Secondary | ICD-10-CM | POA: Diagnosis not present

## 2014-07-29 DIAGNOSIS — K219 Gastro-esophageal reflux disease without esophagitis: Secondary | ICD-10-CM | POA: Diagnosis present

## 2014-07-29 DIAGNOSIS — M169 Osteoarthritis of hip, unspecified: Secondary | ICD-10-CM | POA: Diagnosis not present

## 2014-07-29 DIAGNOSIS — F329 Major depressive disorder, single episode, unspecified: Secondary | ICD-10-CM | POA: Diagnosis present

## 2014-07-29 DIAGNOSIS — M6281 Muscle weakness (generalized): Secondary | ICD-10-CM | POA: Diagnosis not present

## 2014-07-29 DIAGNOSIS — M1990 Unspecified osteoarthritis, unspecified site: Secondary | ICD-10-CM | POA: Diagnosis not present

## 2014-07-29 DIAGNOSIS — F419 Anxiety disorder, unspecified: Secondary | ICD-10-CM | POA: Diagnosis present

## 2014-07-29 DIAGNOSIS — Z471 Aftercare following joint replacement surgery: Secondary | ICD-10-CM | POA: Diagnosis not present

## 2014-07-29 DIAGNOSIS — J309 Allergic rhinitis, unspecified: Secondary | ICD-10-CM | POA: Diagnosis present

## 2014-07-29 DIAGNOSIS — R262 Difficulty in walking, not elsewhere classified: Secondary | ICD-10-CM | POA: Diagnosis not present

## 2014-07-29 DIAGNOSIS — M25551 Pain in right hip: Secondary | ICD-10-CM | POA: Diagnosis not present

## 2014-07-29 DIAGNOSIS — Z419 Encounter for procedure for purposes other than remedying health state, unspecified: Secondary | ICD-10-CM

## 2014-07-29 HISTORY — PX: TOTAL HIP ARTHROPLASTY: SHX124

## 2014-07-29 LAB — URINALYSIS, ROUTINE W REFLEX MICROSCOPIC
Bilirubin Urine: NEGATIVE
Glucose, UA: NEGATIVE mg/dL
Ketones, ur: NEGATIVE mg/dL
LEUKOCYTES UA: NEGATIVE
Nitrite: NEGATIVE
Protein, ur: NEGATIVE mg/dL
SPECIFIC GRAVITY, URINE: 1.015 (ref 1.005–1.030)
Urobilinogen, UA: 0.2 mg/dL (ref 0.0–1.0)
pH: 6.5 (ref 5.0–8.0)

## 2014-07-29 LAB — URINE MICROSCOPIC-ADD ON

## 2014-07-29 SURGERY — ARTHROPLASTY, HIP, TOTAL, ANTERIOR APPROACH
Anesthesia: Spinal | Site: Hip | Laterality: Right

## 2014-07-29 MED ORDER — PHENYLEPHRINE HCL 10 MG/ML IJ SOLN
INTRAMUSCULAR | Status: DC | PRN
  Administered 2014-07-29: 120 ug via INTRAVENOUS
  Administered 2014-07-29 (×2): 80 ug via INTRAVENOUS
  Administered 2014-07-29: 120 ug via INTRAVENOUS
  Administered 2014-07-29 (×2): 80 ug via INTRAVENOUS
  Administered 2014-07-29: 120 ug via INTRAVENOUS
  Administered 2014-07-29: 80 ug via INTRAVENOUS

## 2014-07-29 MED ORDER — PHENYLEPHRINE 40 MCG/ML (10ML) SYRINGE FOR IV PUSH (FOR BLOOD PRESSURE SUPPORT)
PREFILLED_SYRINGE | INTRAVENOUS | Status: AC
  Filled 2014-07-29: qty 20

## 2014-07-29 MED ORDER — VITAMIN D 1000 UNITS PO CAPS
1000.0000 [IU] | ORAL_CAPSULE | Freq: Every day | ORAL | Status: DC
  Administered 2014-07-30 – 2014-07-31 (×2): 1000 [IU] via ORAL
  Filled 2014-07-29 (×3): qty 1

## 2014-07-29 MED ORDER — MIDAZOLAM HCL 5 MG/5ML IJ SOLN
INTRAMUSCULAR | Status: DC | PRN
  Administered 2014-07-29: 2 mg via INTRAVENOUS

## 2014-07-29 MED ORDER — EPHEDRINE SULFATE 50 MG/ML IJ SOLN
INTRAMUSCULAR | Status: DC | PRN
  Administered 2014-07-29 (×2): 10 mg via INTRAVENOUS

## 2014-07-29 MED ORDER — HYDROCODONE-ACETAMINOPHEN 5-325 MG PO TABS
ORAL_TABLET | ORAL | Status: AC
  Filled 2014-07-29: qty 2

## 2014-07-29 MED ORDER — ONDANSETRON HCL 4 MG/2ML IJ SOLN: 4.0000 mg | Freq: Four times a day (QID) | INTRAMUSCULAR | Status: DC | PRN

## 2014-07-29 MED ORDER — METHOCARBAMOL 1000 MG/10ML IJ SOLN
500.0000 mg | Freq: Four times a day (QID) | INTRAVENOUS | Status: DC | PRN
  Filled 2014-07-29: qty 5

## 2014-07-29 MED ORDER — MIDAZOLAM HCL 2 MG/2ML IJ SOLN
INTRAMUSCULAR | Status: AC
  Filled 2014-07-29: qty 2

## 2014-07-29 MED ORDER — PROPOFOL INFUSION 10 MG/ML OPTIME
INTRAVENOUS | Status: DC | PRN
  Administered 2014-07-29: 100 ug/kg/min via INTRAVENOUS

## 2014-07-29 MED ORDER — PHENOL 1.4 % MT LIQD: 1.0000 | OROMUCOSAL | Status: DC | PRN

## 2014-07-29 MED ORDER — ALBUMIN HUMAN 5 % IV SOLN
INTRAVENOUS | Status: DC | PRN
  Administered 2014-07-29: 11:00:00 via INTRAVENOUS

## 2014-07-29 MED ORDER — RISAQUAD PO CAPS
1.0000 | ORAL_CAPSULE | Freq: Every day | ORAL | Status: DC
  Administered 2014-07-30 – 2014-07-31 (×2): 1 via ORAL
  Filled 2014-07-29 (×3): qty 1

## 2014-07-29 MED ORDER — LIDOCAINE HCL (CARDIAC) 20 MG/ML IV SOLN
INTRAVENOUS | Status: DC | PRN
  Administered 2014-07-29: 60 mg via INTRAVENOUS

## 2014-07-29 MED ORDER — ACETAMINOPHEN 325 MG PO TABS: 650.0000 mg | ORAL_TABLET | Freq: Four times a day (QID) | ORAL | Status: DC | PRN

## 2014-07-29 MED ORDER — HYDROMORPHONE HCL 1 MG/ML IJ SOLN
0.5000 mg | INTRAMUSCULAR | Status: DC | PRN
  Administered 2014-07-29: 1 mg via INTRAVENOUS
  Administered 2014-07-29: 0.5 mg via INTRAVENOUS
  Administered 2014-07-29: 1 mg via INTRAVENOUS
  Filled 2014-07-29 (×3): qty 1

## 2014-07-29 MED ORDER — DOCUSATE SODIUM 100 MG PO CAPS
100.0000 mg | ORAL_CAPSULE | Freq: Two times a day (BID) | ORAL | Status: DC
  Administered 2014-07-29 – 2014-07-31 (×4): 100 mg via ORAL
  Filled 2014-07-29 (×6): qty 1

## 2014-07-29 MED ORDER — METOCLOPRAMIDE HCL 10 MG PO TABS: 5.0000 mg | ORAL_TABLET | Freq: Three times a day (TID) | ORAL | Status: DC | PRN

## 2014-07-29 MED ORDER — DIPHENHYDRAMINE HCL 12.5 MG/5ML PO ELIX: 12.5000 mg | ORAL_SOLUTION | ORAL | Status: DC | PRN

## 2014-07-29 MED ORDER — ASPIRIN EC 325 MG PO TBEC
325.0000 mg | DELAYED_RELEASE_TABLET | Freq: Two times a day (BID) | ORAL | Status: DC
  Administered 2014-07-30 – 2014-07-31 (×3): 325 mg via ORAL
  Filled 2014-07-29 (×5): qty 1

## 2014-07-29 MED ORDER — METOCLOPRAMIDE HCL 5 MG/ML IJ SOLN: 5.0000 mg | Freq: Three times a day (TID) | INTRAMUSCULAR | Status: DC | PRN

## 2014-07-29 MED ORDER — LACTATED RINGERS IV SOLN
INTRAVENOUS | Status: DC
  Administered 2014-07-30: 02:00:00 via INTRAVENOUS

## 2014-07-29 MED ORDER — FENTANYL CITRATE 0.05 MG/ML IJ SOLN
INTRAMUSCULAR | Status: DC | PRN
  Administered 2014-07-29: 50 ug via INTRAVENOUS
  Administered 2014-07-29: 150 ug via INTRAVENOUS
  Administered 2014-07-29: 50 ug via INTRAVENOUS

## 2014-07-29 MED ORDER — HYDROCODONE-ACETAMINOPHEN 5-325 MG PO TABS
1.0000 | ORAL_TABLET | ORAL | Status: DC | PRN
  Administered 2014-07-29 – 2014-07-31 (×6): 2 via ORAL
  Filled 2014-07-29 (×7): qty 2

## 2014-07-29 MED ORDER — HYDROMORPHONE HCL 1 MG/ML IJ SOLN
INTRAMUSCULAR | Status: AC
  Filled 2014-07-29: qty 1

## 2014-07-29 MED ORDER — PROPOFOL 10 MG/ML IV BOLUS
INTRAVENOUS | Status: DC | PRN
  Administered 2014-07-29: 50 mg via INTRAVENOUS

## 2014-07-29 MED ORDER — CEFAZOLIN SODIUM-DEXTROSE 2-3 GM-% IV SOLR
2.0000 g | Freq: Four times a day (QID) | INTRAVENOUS | Status: AC
  Filled 2014-07-29 (×3): qty 50

## 2014-07-29 MED ORDER — ONDANSETRON HCL 4 MG PO TABS: 4.0000 mg | ORAL_TABLET | Freq: Four times a day (QID) | ORAL | Status: DC | PRN

## 2014-07-29 MED ORDER — ONE-DAILY MULTI VITAMINS PO TABS
1.0000 | ORAL_TABLET | Freq: Every day | ORAL | Status: DC
  Administered 2014-07-30 – 2014-07-31 (×2): 1 via ORAL
  Filled 2014-07-29 (×3): qty 1

## 2014-07-29 MED ORDER — FENTANYL CITRATE 0.05 MG/ML IJ SOLN: 25.0000 ug | INTRAMUSCULAR | Status: DC | PRN

## 2014-07-29 MED ORDER — ALPRAZOLAM 0.25 MG PO TABS
0.2500 mg | ORAL_TABLET | Freq: Every day | ORAL | Status: DC
  Administered 2014-07-29 – 2014-07-30 (×2): 0.25 mg via ORAL
  Filled 2014-07-29 (×2): qty 1

## 2014-07-29 MED ORDER — 0.9 % SODIUM CHLORIDE (POUR BTL) OPTIME
TOPICAL | Status: DC | PRN
  Administered 2014-07-29: 1000 mL

## 2014-07-29 MED ORDER — BISACODYL 5 MG PO TBEC: 5.0000 mg | DELAYED_RELEASE_TABLET | Freq: Every day | ORAL | Status: DC | PRN

## 2014-07-29 MED ORDER — ACETAMINOPHEN 650 MG RE SUPP: 650.0000 mg | Freq: Four times a day (QID) | RECTAL | Status: DC | PRN

## 2014-07-29 MED ORDER — ALUM & MAG HYDROXIDE-SIMETH 200-200-20 MG/5ML PO SUSP: 30.0000 mL | ORAL | Status: DC | PRN

## 2014-07-29 MED ORDER — MENTHOL 3 MG MT LOZG: 1.0000 | LOZENGE | OROMUCOSAL | Status: DC | PRN

## 2014-07-29 MED ORDER — METHOCARBAMOL 500 MG PO TABS
ORAL_TABLET | ORAL | Status: AC
  Filled 2014-07-29: qty 1

## 2014-07-29 MED ORDER — METHOCARBAMOL 500 MG PO TABS
500.0000 mg | ORAL_TABLET | Freq: Four times a day (QID) | ORAL | Status: DC | PRN
  Administered 2014-07-29 (×2): 500 mg via ORAL
  Filled 2014-07-29 (×2): qty 1

## 2014-07-29 MED ORDER — FENTANYL CITRATE 0.05 MG/ML IJ SOLN
INTRAMUSCULAR | Status: AC
  Filled 2014-07-29: qty 5

## 2014-07-29 MED ORDER — TRANEXAMIC ACID 100 MG/ML IV SOLN
1000.0000 mg | INTRAVENOUS | Status: AC
  Administered 2014-07-29: 1000 mg via INTRAVENOUS
  Filled 2014-07-29: qty 10

## 2014-07-29 SURGICAL SUPPLY — 48 items
BLADE SAW SGTL 18X1.27X75 (BLADE) ×2 IMPLANT
BLADE SAW SGTL 18X1.27X75MM (BLADE) ×1
BLADE SURG ROTATE 9660 (MISCELLANEOUS) IMPLANT
CAPT HIP TOTAL 2 ×3 IMPLANT
CELLS DAT CNTRL 66122 CELL SVR (MISCELLANEOUS) ×1 IMPLANT
COVER PERINEAL POST (MISCELLANEOUS) ×3 IMPLANT
COVER SURGICAL LIGHT HANDLE (MISCELLANEOUS) ×3 IMPLANT
DRAPE C-ARM 42X72 X-RAY (DRAPES) ×3 IMPLANT
DRAPE IMP U-DRAPE 54X76 (DRAPES) ×3 IMPLANT
DRAPE STERI IOBAN 125X83 (DRAPES) ×3 IMPLANT
DRAPE U-SHAPE 47X51 STRL (DRAPES) ×9 IMPLANT
DRSG AQUACEL AG ADV 3.5X10 (GAUZE/BANDAGES/DRESSINGS) ×3 IMPLANT
DURAPREP 26ML APPLICATOR (WOUND CARE) ×3 IMPLANT
ELECT BLADE 4.0 EZ CLEAN MEGAD (MISCELLANEOUS) ×3
ELECT CAUTERY BLADE 6.4 (BLADE) ×3 IMPLANT
ELECT REM PT RETURN 9FT ADLT (ELECTROSURGICAL) ×3
ELECTRODE BLDE 4.0 EZ CLN MEGD (MISCELLANEOUS) ×1 IMPLANT
ELECTRODE REM PT RTRN 9FT ADLT (ELECTROSURGICAL) ×1 IMPLANT
FACESHIELD WRAPAROUND (MASK) ×9 IMPLANT
GLOVE BIO SURGEON STRL SZ8 (GLOVE) ×15 IMPLANT
GLOVE BIOGEL PI IND STRL 8 (GLOVE) ×2 IMPLANT
GLOVE BIOGEL PI INDICATOR 8 (GLOVE) ×4
GOWN STRL REUS W/ TWL LRG LVL3 (GOWN DISPOSABLE) ×1 IMPLANT
GOWN STRL REUS W/ TWL XL LVL3 (GOWN DISPOSABLE) ×2 IMPLANT
GOWN STRL REUS W/TWL LRG LVL3 (GOWN DISPOSABLE) ×2
GOWN STRL REUS W/TWL XL LVL3 (GOWN DISPOSABLE) ×4
KIT BASIN OR (CUSTOM PROCEDURE TRAY) ×3 IMPLANT
KIT ROOM TURNOVER OR (KITS) ×3 IMPLANT
LINER BOOT UNIVERSAL DISP (MISCELLANEOUS) ×3 IMPLANT
MANIFOLD NEPTUNE II (INSTRUMENTS) ×3 IMPLANT
NS IRRIG 1000ML POUR BTL (IV SOLUTION) ×3 IMPLANT
PACK TOTAL JOINT (CUSTOM PROCEDURE TRAY) ×3 IMPLANT
PACK UNIVERSAL I (CUSTOM PROCEDURE TRAY) ×3 IMPLANT
PAD ARMBOARD 7.5X6 YLW CONV (MISCELLANEOUS) ×6 IMPLANT
RTRCTR WOUND ALEXIS 18CM MED (MISCELLANEOUS) ×3
STAPLER VISISTAT 35W (STAPLE) ×3 IMPLANT
SUT ETHIBOND NAB CT1 #1 30IN (SUTURE) ×12 IMPLANT
SUT VIC AB 0 CT1 27 (SUTURE)
SUT VIC AB 0 CT1 27XBRD ANBCTR (SUTURE) IMPLANT
SUT VIC AB 1 CT1 27 (SUTURE) ×2
SUT VIC AB 1 CT1 27XBRD ANBCTR (SUTURE) ×1 IMPLANT
SUT VIC AB 2-0 CT1 27 (SUTURE) ×2
SUT VIC AB 2-0 CT1 TAPERPNT 27 (SUTURE) ×1 IMPLANT
SUT VLOC 180 0 24IN GS25 (SUTURE) ×3 IMPLANT
TOWEL OR 17X24 6PK STRL BLUE (TOWEL DISPOSABLE) ×3 IMPLANT
TOWEL OR 17X26 10 PK STRL BLUE (TOWEL DISPOSABLE) ×6 IMPLANT
TRAY FOLEY CATH 14FR (SET/KITS/TRAYS/PACK) IMPLANT
WATER STERILE IRR 1000ML POUR (IV SOLUTION) IMPLANT

## 2014-07-29 NOTE — Transfer of Care (Signed)
Immediate Anesthesia Transfer of Care Note  Patient: Bridget Mendez  Procedure(s) Performed: Procedure(s): RIGHT TOTAL HIP ARTHROPLASTY ANTERIOR APPROACH (Right)  Patient Location: PACU  Anesthesia Type:Spinal  Level of Consciousness: awake, alert  and oriented  Airway & Oxygen Therapy: Patient Spontanous Breathing and Patient connected to nasal cannula oxygen  Post-op Assessment: Report given to PACU RN and Post -op Vital signs reviewed and stable  Post vital signs: Reviewed  Complications: No apparent anesthesia complications

## 2014-07-29 NOTE — Progress Notes (Signed)
Notified Eleanor/family friend of her disposition and stable condition

## 2014-07-29 NOTE — Anesthesia Procedure Notes (Signed)
Spinal Patient location during procedure: OR Start time: 07/29/2014 10:35 AM End time: 07/29/2014 10:50 AM Staffing Performed by: anesthesiologist  Preanesthetic Checklist Completed: patient identified, site marked, surgical consent, pre-op evaluation, timeout performed, IV checked, risks and benefits discussed and monitors and equipment checked Spinal Block Patient position: right lateral decubitus Prep: ChloraPrep Patient monitoring: heart rate, cardiac monitor, continuous pulse ox and blood pressure Approach: right paramedian Location: L3-4 Injection technique: single-shot Needle Needle type: Quincke  Needle gauge: 22 G Assessment Sensory level: T10

## 2014-07-29 NOTE — OR Nursing (Signed)
All documentation from [phone] on OR record done by [facility]

## 2014-07-29 NOTE — Progress Notes (Signed)
Orthopedic Tech Progress Note Patient Details:  Bridget Mendez 11/18/1919 [id number] Patient unable to use ohf. Patient ID: Bridget Mendez, female   DOB: Nov 06, 1919, 81 y.o.   MRN: [id number]   Braulio Bosch 07/29/2014, 7:32 PM

## 2014-07-29 NOTE — Op Note (Signed)
PRE-OP DIAGNOSIS:  RIGHT HIP DEGENERATIVE JOINT DISEASE POST-OP DIAGNOSIS:  same PROCEDURE: RIGHT TOTAL HIP ARTHROPLASTY ANTERIOR APPROACH ANESTHESIA:  Spinal and MAC SURGEON:  Melrose Nakayama MD ASSISTANT:  Loni Dolly PA-C   INDICATIONS FOR PROCEDURE:  The patient is a 81 y.o. female with a long history of a painful hip.  This has persisted despite multiple conservative measures.  The patient has persisted with pain and dysfunction making rest and activity difficult.  A total hip replacement is offered as surgical treatment.  Informed operative consent was obtained after discussion of possible complications including reaction to anesthesia, infection, neurovascular injury, dislocation, DVT, PE, and death.  The importance of the postoperative rehab program to optimize result was stressed with the patient.  SUMMARY OF FINDINGS AND PROCEDURE:  Under general anesthesia through a anterior approach an the Hana table a right THR was performed.  The patient had severe degenerative change and good bone quality.  We used DePuy components to replace the hip and these were size KLA 12 Corail femur capped with a +1 109mm steel hip ball.  On the acetabular side we used a size 50 Gription shell with a  plus 4 neutral polyethylene liner.  We did use a hole eliminator.  Loni Dolly PA-C assisted throughout and was invaluable to the completion of the case in that he helped position and retract while I performed the procedure.  He also closed simultaneously to help minimize OR time.  I used fluoroscopy throughout the case to check position of implants and leg lengths and read all of these views myself.  DESCRIPTION OF PROCEDURE:  The patient was taken to the OR suite where general anesthetic was applied.  The patient was then positioned on the Hana table supine.  All bony prominences were appropriately padded.  Prep and drape was then performed in normal sterile fashion.  The patient was given kefzol preoperative  antibiotic and an appropriate time out was performed.  We then took an anterior approach to the right hip.  Dissection was taken through adipose to the tensor fascia lata fascia.  This structure was incised longitudinally and we dissected in the intermuscular interval just medial to this muscle.  Cobra retractors were placed superior and inferior to the femoral neck superficial to the capsule.  A capsular incision was then made and the retractors were placed along the femoral neck.  Xray was brought in to get a good level for the femoral neck cut which was made with an oscillating saw and osteotome.  The femoral head was removed with a corkscrew.  The acetabulum was exposed and some labral tissues were excised. Reaming was taken to the inside wall of the pelvis and sequentially up to 1 mm smaller than the actual component.  A trial of components was done and then the aforementioned acetabular shell was placed in appropriate tilt and anteversion confirmed by fluoroscopy. The liner was placed along with the hole eliminator and attention was turned to the femur.  The leg was brought down and over into adduction and the elevator bar was used to raise the femur up gently in the wound.  The piriformis was released with care taken to preserve the obturator internus attachment and all of the posterior capsule. The femur was reamed and then broached to the appropriate size.  A trial reduction was done and the aforementioned head and neck assembly gave [place] the best stability in extension with external rotation.  Leg lengths were felt to be about equal by  fluoroscopic exam.  The trial components were removed and the wound irrigated.  We then placed the femoral component in appropriate anteversion.  The head was applied to a dry stem neck and the hip again reduced.  It was again stable in the aforementioned position.  The would was irrigated again followed by re-approximation of anterior capsule with ethibond suture. Tensor  fascia was repaired with V-loc suture  followed by subcutaneous closure with #O and #2 undyed vicryl.  Skin was closed with staples followed by a sterile dressing.  EBL and IOF can be obtained from anesthesia records.  DISPOSITION:  The patient was extubated in the OR and taken to PACU in stable condition to be admitted to the Orthopedic Surgery for appropriate post-op care to include perioperative antibiotics and DVT prophylaxis.

## 2014-07-29 NOTE — Interval H&P Note (Signed)
OK for surgery PD 

## 2014-07-29 NOTE — Progress Notes (Signed)
Utilization review completed.  

## 2014-07-29 NOTE — Anesthesia Preprocedure Evaluation (Addendum)
Anesthesia Evaluation  Patient identified by MRN, date of birth, ID band Patient awake    Reviewed: Allergy & Precautions, NPO status , Patient's Chart, lab work & pertinent test results  Airway Mallampati: II  TM Distance: >3 FB Neck ROM: Full    Dental  (+) Teeth Intact   Pulmonary former smoker,  breath sounds clear to auscultation        Cardiovascular negative cardio ROS  Rhythm:Regular Rate:Normal     Neuro/Psych    GI/Hepatic Neg liver ROS, GERD-  ,  Endo/Other  negative endocrine ROS  Renal/GU negative Renal ROS     Musculoskeletal   Abdominal   Peds  Hematology   Anesthesia Other Findings   Reproductive/Obstetrics                            Anesthesia Physical Anesthesia Plan  ASA: II  Anesthesia Plan: Spinal   Post-op Pain Management:    Induction:   Airway Management Planned: Nasal Cannula and Simple Face Mask  Additional Equipment:   Intra-op Plan:   Post-operative Plan:   Informed Consent: I have reviewed the patients History and Physical, chart, labs and discussed the procedure including the risks, benefits and alternatives for the proposed anesthesia with the patient or authorized representative who has indicated his/her understanding and acceptance.   Dental advisory given  Plan Discussed with: CRNA, Anesthesiologist and Surgeon  Anesthesia Plan Comments:        Anesthesia Quick Evaluation

## 2014-07-30 ENCOUNTER — Encounter (HOSPITAL_COMMUNITY): Payer: Self-pay | Admitting: Orthopaedic Surgery

## 2014-07-30 LAB — URINE CULTURE: Colony Count: 10000

## 2014-07-30 LAB — CBC
HCT: 29.8 % — ABNORMAL LOW (ref 36.0–46.0)
Hemoglobin: 9.7 g/dL — ABNORMAL LOW (ref 12.0–15.0)
MCH: 30.9 pg (ref 26.0–34.0)
MCHC: 32.6 g/dL (ref 30.0–36.0)
MCV: 94.9 fL (ref 78.0–100.0)
PLATELETS: 311 10*3/uL (ref 150–400)
RBC: 3.14 MIL/uL — ABNORMAL LOW (ref 3.87–5.11)
RDW: 14.2 % (ref 11.5–15.5)
WBC: 9.6 10*3/uL (ref 4.0–10.5)

## 2014-07-30 LAB — BASIC METABOLIC PANEL
Anion gap: 10 (ref 5–15)
BUN: 17 mg/dL (ref 6–23)
CALCIUM: 8.3 mg/dL — AB (ref 8.4–10.5)
CO2: 26 mmol/L (ref 19–32)
CREATININE: 0.87 mg/dL (ref 0.50–1.10)
Chloride: 100 mEq/L (ref 96–112)
GFR calc non Af Amer: 56 mL/min — ABNORMAL LOW (ref >90)
GFR, EST AFRICAN AMERICAN: 65 mL/min — AB (ref >90)
GLUCOSE: 154 mg/dL — AB (ref 70–99)
POTASSIUM: 4.8 mmol/L (ref 3.5–5.1)
Sodium: 136 mmol/L (ref 135–145)

## 2014-07-30 NOTE — Progress Notes (Signed)
OT Cancellation Note  Patient Details Name: Bridget Mendez MRN: [id number] DOB: March 11, 1920   Cancelled Treatment:    Reason Eval/Treat Not Completed: OT screened.  Pt is Medicare and current D/C plan is SNF. No apparent immediate acute care OT needs, therefore will defer OT to SNF. If OT eval is needed please call Acute Rehab Dept. at [phone] or text page OT at [phone].    Benito Mccreedy OTR/L [id number] 07/30/2014, 10:10 AM

## 2014-07-30 NOTE — Anesthesia Postprocedure Evaluation (Signed)
  Anesthesia Post-op Note  Patient: Bridget Mendez  Procedure(s) Performed: Procedure(s): RIGHT TOTAL HIP ARTHROPLASTY ANTERIOR APPROACH (Right)  Patient Location: PACU  Anesthesia Type:Spinal  Level of Consciousness: awake  Airway and Oxygen Therapy: Patient Spontanous Breathing  Post-op Pain: none  Post-op Assessment: Post-op Vital signs reviewed  Post-op Vital Signs: Reviewed  Last Vitals:  Filed Vitals:   07/30/14 2006  BP: 114/54  Pulse: 100  Temp: 37.6 C  Resp: 19    Complications: No apparent anesthesia complications

## 2014-07-30 NOTE — Progress Notes (Signed)
Physical Therapy Treatment Patient Details Name: Bridget Mendez MRN: [id number] DOB: October 28, 1919 Today's Date: 08/05/14    History of Present Illness Pt s/p rt direct anterior hip replacement. PMH- Rt TKA    PT Comments    Pt making good progress with mobility.  Follow Up Recommendations  SNF     Equipment Recommendations  None recommended by PT    Recommendations for Other Services       Precautions / Restrictions Precautions Precautions: None Restrictions Weight Bearing Restrictions: Yes RLE Weight Bearing: Weight bearing as tolerated    Mobility  Bed Mobility Overal bed mobility: Needs Assistance Bed Mobility: Supine to Sit     Supine to sit: Min assist;HOB elevated     General bed mobility comments: assist to bring rt leg over and to elevate trunk.  Transfers Overall transfer level: Needs assistance Equipment used: Rolling walker (2 wheeled)   Sit to Stand: Min assist         General transfer comment: Verbal cues for hand placement and assist to bring hips up.  Ambulation/Gait Ambulation/Gait assistance: Min assist Ambulation Distance (Feet): 18 Feet Assistive device: Rolling walker (2 wheeled) Gait Pattern/deviations: Step-through pattern;Decreased step length - left;Decreased stance time - left;Antalgic Gait velocity: decr   General Gait Details: Verbal cues for gait sequence. Assist for support.   Stairs            Wheelchair Mobility    Modified Rankin (Stroke Patients Only)       Balance                                    Cognition Arousal/Alertness: Awake/alert Behavior During Therapy: WFL for tasks assessed/performed Overall Cognitive Status: Within Functional Limits for tasks assessed                      Exercises      General Comments        Pertinent Vitals/Pain Pain Assessment: Faces Faces Pain Scale: Hurts little more Pain Location: rt hip Pain Descriptors / Indicators:  Grimacing Pain Intervention(s): Limited activity within patient's tolerance;Monitored during session;Repositioned    Home Living Family/patient expects to be discharged to:: Skilled nursing facility               Additional Comments: Pt from [facility] ALF and plans to return to their rehab    Prior Function Level of Independence: Independent with assistive device(s)      Comments: Uses rollator   PT Goals (current goals can now be found in the care plan section) Acute Rehab PT Goals Patient Stated Goal: Be able to be more active without pain PT Goal Formulation: With patient Time For Goal Achievement: 08/06/14 Potential to Achieve Goals: Good    Frequency  7X/week    PT Plan      Co-evaluation             End of Session Equipment Utilized During Treatment: Gait belt Activity Tolerance: Patient tolerated treatment well Patient left: in chair;with call bell/phone within reach     Time:  -     Charges:  $Gait Training: 8-22 mins                    G Codes:      Madox Corkins 08-05-2014, 2:15 PM  [employer] PT [phone]

## 2014-07-30 NOTE — Progress Notes (Signed)
Subjective: 1 Day Post-Op Procedure(s) (LRB): RIGHT TOTAL HIP ARTHROPLASTY ANTERIOR APPROACH (Right)  Activity level:WBAT  Diet tolerance:  Eating well Voiding:  ok Patient reports pain as mild.    Objective: Vital signs in last 24 hours: Temp:  [97.9 F (36.6 C)-99.9 F (37.7 C)] 99.9 F (37.7 C) (01/20 0447) Pulse Rate:  [62-100] 100 (01/20 0447) Resp:  [11-20] 19 (01/20 0447) BP: (118-170)/(56-79) 130/65 mmHg (01/20 0447) SpO2:  [93 %-99 %] 96 % (01/20 0447) Weight:  [73.165 kg (161 lb 4.8 oz)] 73.165 kg (161 lb 4.8 oz) (01/19 0915)  Labs:  Recent Labs  07/30/14 0549  HGB 9.7*    Recent Labs  07/30/14 0549  WBC 9.6  RBC 3.14*  HCT 29.8*  PLT 311    Recent Labs  07/30/14 0549  NA 136  K 4.8  CL 100  CO2 26  BUN 17  CREATININE 0.87  GLUCOSE 154*  CALCIUM 8.3*   No results for input(s): LABPT, INR in the last 72 hours.  Physical Exam:  Neurologically intact ABD soft Neurovascular intact Sensation intact distally Intact pulses distally Dorsiflexion/Plantar flexion intact Incision: dressing C/D/I No cellulitis present Compartment soft  Assessment/Plan:  1 Day Post-Op Procedure(s) (LRB): RIGHT TOTAL HIP ARTHROPLASTY ANTERIOR APPROACH (Right) Advance diet Up with therapy D/C IV fluids Plan for discharge tomorrow Discharge to SNF pending PT eval and if patient continues to do well. Continue on ASA 325mg  BID x 4 weeks Follow up ion office 2 weeks post op.    Placido Hangartner, Larwance Sachs 07/30/2014, 8:07 AM

## 2014-07-30 NOTE — Progress Notes (Signed)
07/30/14 PT recommended SNF. Referral made to CSW. Will continue to follow for d/c needs.

## 2014-07-30 NOTE — Evaluation (Signed)
Physical Therapy Evaluation Patient Details Name: Bridget Mendez MRN: [id number] DOB: 05-28-1920 Today's Date: 07/30/2014   History of Present Illness  Pt s/p rt direct anterior hip replacement. PMH- Rt TKA  Clinical Impression  Pt admitted with above diagnosis. Pt currently with functional limitations due to the deficits listed below (see PT Problem List).  Pt will benefit from skilled PT to increase their independence and safety with mobility to allow discharge to the venue listed below.       Follow Up Recommendations SNF    Equipment Recommendations  None recommended by PT    Recommendations for Other Services       Precautions / Restrictions Precautions Precautions: None Restrictions Weight Bearing Restrictions: Yes RLE Weight Bearing: Weight bearing as tolerated      Mobility  Bed Mobility Overal bed mobility: Needs Assistance Bed Mobility: Supine to Sit     Supine to sit: Min assist;HOB elevated     General bed mobility comments: assist to bring rt leg over and to elevate trunk.  Transfers Overall transfer level: Needs assistance Equipment used: Rolling walker (2 wheeled) Transfers: Sit to/from Stand Sit to Stand: Min assist         General transfer comment: Verbal cues for hand placement and assist to bring hips up.  Ambulation/Gait Ambulation/Gait assistance: Min assist Ambulation Distance (Feet): 8 Feet Assistive device: Rolling walker (2 wheeled) Gait Pattern/deviations: Step-to pattern;Decreased step length - right;Decreased step length - left;Decreased stance time - right;Ataxic Gait velocity: decr Gait velocity interpretation: Below normal speed for age/gender General Gait Details: Verbal cues for gait sequence. Assist to initiate swing with RLE at times. Assist for support.  Stairs            Wheelchair Mobility    Modified Rankin (Stroke Patients Only)       Balance                                              Pertinent Vitals/Pain Pain Assessment: Faces Faces Pain Scale: Hurts even more Pain Location: rt hip Pain Descriptors / Indicators: Grimacing;Sore Pain Intervention(s): Limited activity within patient's tolerance;Monitored during session;Repositioned    Home Living Family/patient expects to be discharged to:: Skilled nursing facility                 Additional Comments: Pt from [facility] ALF and plans to return to their rehab    Prior Function Level of Independence: Independent with assistive device(s)         Comments: Uses rollator     Hand Dominance        Extremity/Trunk Assessment   Upper Extremity Assessment: Defer to OT evaluation           Lower Extremity Assessment: RLE deficits/detail RLE Deficits / Details: Requires assist against gravity due to post-op pain       Communication   Communication: HOH  Cognition Arousal/Alertness: Awake/alert Behavior During Therapy: WFL for tasks assessed/performed Overall Cognitive Status: Within Functional Limits for tasks assessed                      General Comments      Exercises Total Joint Exercises Ankle Circles/Pumps: AROM;Both;10 reps;Seated Quad Sets: AROM;Both;10 reps;Seated Hip ABduction/ADduction: AAROM;Right;10 reps;Seated Long Arc Quad: AAROM;Right;10 reps;Seated      Assessment/Plan    PT Assessment Patient needs  continued PT services  PT Diagnosis Difficulty walking;Acute pain   PT Problem List Decreased strength;Decreased activity tolerance;Decreased balance;Decreased mobility;Decreased knowledge of use of DME;Pain  PT Treatment Interventions DME instruction;Gait training;Functional mobility training;Therapeutic activities;Therapeutic exercise;Balance training;Patient/family education   PT Goals (Current goals can be found in the Care Plan section) Acute Rehab PT Goals Patient Stated Goal: Be able to be more active without pain PT Goal Formulation: With  patient Time For Goal Achievement: 08/06/14 Potential to Achieve Goals: Good    Frequency 7X/week   Barriers to discharge        Co-evaluation               End of Session   Activity Tolerance: Patient tolerated treatment well Patient left: in chair;with call bell/phone within reach Nurse Communication: Mobility status         Time: [id number] PT Time Calculation (min) (ACUTE ONLY): 32 min   Charges:   PT Evaluation $Initial PT Evaluation Tier I: 1 Procedure PT Treatments $Gait Training: 8-22 mins $Therapeutic Exercise: 8-22 mins   PT G Codes:        Bridget Mendez 08-05-2014, 9:39 AM  [facility] PT [phone]

## 2014-07-31 DIAGNOSIS — F419 Anxiety disorder, unspecified: Secondary | ICD-10-CM | POA: Diagnosis not present

## 2014-07-31 DIAGNOSIS — M1611 Unilateral primary osteoarthritis, right hip: Secondary | ICD-10-CM | POA: Diagnosis not present

## 2014-07-31 DIAGNOSIS — Z471 Aftercare following joint replacement surgery: Secondary | ICD-10-CM | POA: Diagnosis not present

## 2014-07-31 DIAGNOSIS — M6281 Muscle weakness (generalized): Secondary | ICD-10-CM | POA: Diagnosis not present

## 2014-07-31 DIAGNOSIS — M1990 Unspecified osteoarthritis, unspecified site: Secondary | ICD-10-CM | POA: Diagnosis not present

## 2014-07-31 DIAGNOSIS — F411 Generalized anxiety disorder: Secondary | ICD-10-CM | POA: Diagnosis not present

## 2014-07-31 DIAGNOSIS — R262 Difficulty in walking, not elsewhere classified: Secondary | ICD-10-CM | POA: Diagnosis not present

## 2014-07-31 DIAGNOSIS — M25551 Pain in right hip: Secondary | ICD-10-CM | POA: Diagnosis not present

## 2014-07-31 LAB — CBC
HEMATOCRIT: 28.5 % — AB (ref 36.0–46.0)
Hemoglobin: 9.4 g/dL — ABNORMAL LOW (ref 12.0–15.0)
MCH: 31.3 pg (ref 26.0–34.0)
MCHC: 33 g/dL (ref 30.0–36.0)
MCV: 95 fL (ref 78.0–100.0)
Platelets: 307 10*3/uL (ref 150–400)
RBC: 3 MIL/uL — ABNORMAL LOW (ref 3.87–5.11)
RDW: 14.5 % (ref 11.5–15.5)
WBC: 12 10*3/uL — ABNORMAL HIGH (ref 4.0–10.5)

## 2014-07-31 MED ORDER — ASPIRIN 325 MG PO TBEC: 325.0000 mg | DELAYED_RELEASE_TABLET | Freq: Two times a day (BID) | ORAL | Status: DC

## 2014-07-31 MED ORDER — METHOCARBAMOL 500 MG PO TABS: 500.0000 mg | ORAL_TABLET | Freq: Four times a day (QID) | ORAL | Status: DC | PRN

## 2014-07-31 MED ORDER — HYDROCODONE-ACETAMINOPHEN 5-325 MG PO TABS: 1.0000 | ORAL_TABLET | Freq: Four times a day (QID) | ORAL | Status: DC | PRN

## 2014-07-31 NOTE — Discharge Planning (Addendum)
Patient has bed available for 07/31/2014 at [facility]. MD to please sign FL2 on patient's chart prior to discharge.  Facility: [facility] Report number: [phone] Transportation: EMS ([address]  Lubertha Sayres, [place] ([id number]) Licensed Clinical Social Worker Orthopedics [phone]) and Surgical [phone])

## 2014-07-31 NOTE — Progress Notes (Signed)
Physical Therapy Treatment Patient Details Name: Bridget Mendez MRN: [id number] DOB: 10/16/1919 Today's Date: 07/31/2014    History of Present Illness Pt s/p rt direct anterior hip replacement. PMH- Rt TKA    PT Comments    Pt is progressing well, but slowly with her gait and mobility.  She is limited by pain and upper extremity weakness during gait.  She continues to progress with both gait distance and assist during general mobility, and continues to be appropriate for SNF level rehab at discharge.  PT will follow acutely until d/c to SNF.   Follow Up Recommendations  SNF     Equipment Recommendations  None recommended by PT    Recommendations for Other Services   NA     Precautions / Restrictions Precautions Precautions: None Restrictions RLE Weight Bearing: Weight bearing as tolerated    Mobility  Bed Mobility Overal bed mobility: Needs Assistance Bed Mobility: Sit to Supine       Sit to supine: Min assist   General bed mobility comments: Min assist to help lift legs into bed with HOB flat and no railing.    Transfers Overall transfer level: Needs assistance Equipment used: Rolling walker (2 wheeled) Transfers: Sit to/from Stand Sit to Stand: Min assist         General transfer comment: Min assist to support trunk during transitions. Verbal cues for safe hand placement.   Ambulation/Gait Ambulation/Gait assistance: Min assist Ambulation Distance (Feet): 35 Feet Assistive device: Rolling walker (2 wheeled) Gait Pattern/deviations: Step-to pattern;Antalgic;Trunk flexed     General Gait Details: Verbal cues for upright posture, correct proximity to RW and LE sequencing.           Balance Overall balance assessment: Needs assistance Sitting-balance support: Feet supported;No upper extremity supported Sitting balance-Leahy Scale: Good     Standing balance support: Bilateral upper extremity supported;No upper extremity supported;Single extremity  supported Standing balance-Leahy Scale: Fair                      Cognition Arousal/Alertness: Awake/alert Behavior During Therapy: WFL for tasks assessed/performed Overall Cognitive Status: Within Functional Limits for tasks assessed                      Exercises Total Joint Exercises Ankle Circles/Pumps: AROM;Both;10 reps;Supine Quad Sets: AROM;Right;10 reps;Supine Short Arc Quad: AROM;Right;10 reps;Supine Hip ABduction/ADduction: AAROM;Right;10 reps;Supine        Pertinent Vitals/Pain Pain Assessment: 0-10 Pain Score: 5  Pain Location: right hip Pain Descriptors / Indicators: Aching Pain Intervention(s): Limited activity within patient's tolerance;Monitored during session;Repositioned           PT Goals (current goals can now be found in the care plan section) Acute Rehab PT Goals Patient Stated Goal: Be able to be more active without pain Progress towards PT goals: Progressing toward goals    Frequency  7X/week    PT Plan Current plan remains appropriate       End of Session   Activity Tolerance: Patient limited by pain;Patient limited by fatigue Patient left: in chair;with call bell/phone within reach     Time: 1001-1032 PT Time Calculation (min) (ACUTE ONLY): 31 min  Charges:  $Gait Training: 8-22 mins $Therapeutic Exercise: 8-22 mins                      Brody Bonneau B. Brightyn Mozer, [place], DPT [phone]   07/31/2014, 4:45 PM

## 2014-07-31 NOTE — Clinical Social Work Placement (Signed)
Clinical Social Work Department CLINICAL SOCIAL WORK PLACEMENT NOTE 07/31/2014  Patient:  Bridget Mendez, Bridget Mendez  Account Number:  [id number] [place] date:  07/29/2014  Clinical Social Worker:  Delrae Sawyers  Date/time:  07/31/2014 12:18 PM  Clinical Social Work is seeking post-discharge placement for this patient at the following level of care:   [place]   (*CSW will update this form in Epic as items are completed)   07/30/2014  Patient/family provided with [place] Department of Clinical Social Work's list of facilities offering this level of care within the geographic area requested by the patient (or if unable, by the patient's family).  07/30/2014  Patient/family informed of their freedom to choose among providers that offer the needed level of care, that participate in Medicare, Medicaid or managed care program needed by the patient, have an available bed and are willing to accept the patient.  07/30/2014  Patient/family informed of [facility]' ownership interest in [facility], as well as of the fact that they are under no obligation to receive care at this facility.  PASARR submitted to EDS on 07/31/2014 PASARR number received on 07/31/2014  FL2 transmitted to all facilities in geographic area requested by pt/family on  07/30/2014 FL2 transmitted to all facilities within larger geographic area on   Patient informed that his/her managed care company has contracts with or will negotiate with  certain facilities, including the following:     Patient/family informed of bed offers received:  07/30/2014 Patient chooses bed at [facility] Physician recommends and patient chooses bed at    Patient to be transferred to [facility] on  07/31/2014 Patient to be transferred to facility by [facility] Patient and family notified of transfer on 07/31/2014 Name of family member notified:  Patient updated at bedside.  The following physician request were  entered in Epic:   Additional Comments:  [place] ([id number]) Licensed Clinical Social Worker Orthopedics [phone]) and Surgical [phone])

## 2014-07-31 NOTE — Discharge Summary (Signed)
Patient ID: Bridget Mendez MRN: [id number] DOB/AGE: [birth year] 81 y.o.  Admit date: 07/29/2014 Discharge date: 07/31/2014  Admission Diagnoses:  Principal Problem:   Primary osteoarthritis of right hip   Discharge Diagnoses:  Same  Past Medical History  Diagnosis Date  . GERD (gastroesophageal reflux disease)   . Osteoarthritis   . Osteoporosis   . Depression   . Allergy   . Anxiety     Surgeries: Procedure(s): RIGHT TOTAL HIP ARTHROPLASTY ANTERIOR APPROACH on 07/29/2014   Consultants:    Discharged Condition: Improved  Hospital Course: Bridget Mendez is an 81 y.o. female who was admitted 07/29/2014 for operative treatment ofPrimary osteoarthritis of right hip. Patient has severe unremitting pain that affects sleep, daily activities, and work/hobbies. After pre-op clearance the patient was taken to the operating room on 07/29/2014 and underwent  Procedure(s): RIGHT TOTAL HIP ARTHROPLASTY ANTERIOR APPROACH.    Patient was given perioperative antibiotics: Anti-infectives    Start     Dose/Rate Route Frequency Ordered Stop   07/29/14 1700  ceFAZolin (ANCEF) IVPB 2 g/50 mL premix     2 g100 mL/hr over 30 Minutes Intravenous Every 6 hours 07/29/14 1527 07/30/14 0459   07/29/14 0600  ceFAZolin (ANCEF) IVPB 2 g/50 mL premix     2 g100 mL/hr over 30 Minutes Intravenous On call to O.R. 07/28/14 1346 07/29/14 1049       Patient was given sequential compression devices, early ambulation, and chemoprophylaxis to prevent DVT.  Patient benefited maximally from hospital stay and there were no complications.    Recent vital signs: Patient Vitals for the past 24 hrs:  BP Temp Pulse Resp SpO2  07/31/14 0440 (!) 91/55 mmHg 98.6 F (37 C) 91 19 92 %  07/30/14 2006 (!) 114/54 mmHg 99.6 F (37.6 C) 100 19 92 %  07/30/14 1255 (!) 118/58 mmHg 99.6 F (37.6 C) (!) 108 19 96 %     Recent laboratory studies:  Recent Labs  07/30/14 0549 07/31/14 0608  WBC 9.6 12.0*  HGB 9.7*  9.4*  HCT 29.8* 28.5*  PLT 311 307  NA 136  --   K 4.8  --   CL 100  --   CO2 26  --   BUN 17  --   CREATININE 0.87  --   GLUCOSE 154*  --   CALCIUM 8.3*  --      Discharge Medications:     Medication List    STOP taking these medications        aspirin 81 MG tablet  Replaced by:  aspirin 325 MG EC tablet      TAKE these medications        ALPRAZolam 0.25 MG tablet  Commonly known as:  XANAX  Take 1 tablet (0.25 mg total) by mouth at bedtime.     aspirin 325 MG EC tablet  Take 1 tablet (325 mg total) by mouth 2 (two) times daily after a meal.     CALCIUM 1000 + D PO  Take 1 tablet by mouth daily.     glucosamine-chondroitin 500-400 MG tablet  Take 1 tablet by mouth daily.     HYDROcodone-acetaminophen 5-325 MG per tablet  Commonly known as:  NORCO/VICODIN  Take 1-2 tablets by mouth every 6 (six) hours as needed for moderate pain.     methocarbamol 500 MG tablet  Commonly known as:  ROBAXIN  Take 1 tablet (500 mg total) by mouth every 6 (six) hours as needed for muscle spasms.  methylcellulose packet  Take 1 each by mouth as needed for constipation.     multivitamin tablet  Take 1 tablet by mouth daily.     PROBIOTIC DAILY Caps  Take 1 capsule by mouth daily.     Vitamin D 1000 UNITS capsule  Take 1,000 Units by mouth daily.        Diagnostic Studies: Dg Chest 2 View  07/10/2014   CLINICAL DATA:  Prior smoker.  Preoperative chest x-ray.  EXAM: CHEST  2 VIEW  COMPARISON:  None.  FINDINGS: Mediastinum and hilar structures normal. Mild cardiomegaly. Normal pulmonary vascularity. Mild basilar pleural parenchymal thickening noted most consistent with scarring. COPD cannot be excluded. Diffuse thoracic cage osteopenia and degenerative change .  IMPRESSION: 1. Findings suggesting COPD with bibasilar pleural parenchymal scarring. 2. Mild cardiomegaly, normal pulmonary vascularity.   Electronically Signed   By: Marcello Moores  Register   On: 07/10/2014 11:10   Dg  Hip Operative Unilat With Pelvis Right  07/29/2014   CLINICAL DATA:  Right hip replacement.  EXAM: OPERATIVE RIGHT HIP WITH PELVIS  COMPARISON:  None.  FINDINGS: Total right hip replacement with good anatomic alignment. No acute abnormality identified. Prostheses intact. Prior inguinal hernia repair.  IMPRESSION: Total right hip replacement with good anatomic alignment.   Electronically Signed   By: Marcello Moores  Register   On: 07/29/2014 15:27    Disposition: Final discharge disposition not confirmed      Discharge Instructions    Call MD / Call 911    Complete by:  As directed   If you experience chest pain or shortness of breath, CALL 911 and be transported to the hospital emergency room.  If you develope a fever above 101 F, pus (white drainage) or increased drainage or redness at the wound, or calf pain, call your surgeon's office.     Constipation Prevention    Complete by:  As directed   Drink plenty of fluids.  Prune juice may be helpful.  You may use a stool softener, such as Colace (over the counter) 100 mg twice a day.  Use MiraLax (over the counter) for constipation as needed.     Diet - low sodium heart healthy    Complete by:  As directed      Increase activity slowly as tolerated    Complete by:  As directed            Follow-up Information    Follow up with Hessie Dibble, MD. Schedule an appointment as soon as possible for a visit in 2 weeks.   Specialty:  Orthopedic Surgery   Contact information:   [place] [address] [phone]        Signed: Rich Fuchs 07/31/2014, 12:33 PM

## 2014-07-31 NOTE — Progress Notes (Signed)
[facility] to be D/C'd Skilled nursing facility per MD order. Discussed with the patient and all questions fully answered.    Medication List    STOP taking these medications        aspirin 81 MG tablet  Replaced by:  aspirin 325 MG EC tablet      TAKE these medications        ALPRAZolam 0.25 MG tablet  Commonly known as:  XANAX  Take 1 tablet (0.25 mg total) by mouth at bedtime.     aspirin 325 MG EC tablet  Take 1 tablet (325 mg total) by mouth 2 (two) times daily after a meal.     CALCIUM 1000 + D PO  Take 1 tablet by mouth daily.     glucosamine-chondroitin 500-400 MG tablet  Take 1 tablet by mouth daily.     HYDROcodone-acetaminophen 5-325 MG per tablet  Commonly known as:  NORCO/VICODIN  Take 1-2 tablets by mouth every 6 (six) hours as needed for moderate pain.     methocarbamol 500 MG tablet  Commonly known as:  ROBAXIN  Take 1 tablet (500 mg total) by mouth every 6 (six) hours as needed for muscle spasms.     methylcellulose packet  Take 1 each by mouth as needed for constipation.     multivitamin tablet  Take 1 tablet by mouth daily.     PROBIOTIC DAILY Caps  Take 1 capsule by mouth daily.     Vitamin D 1000 UNITS capsule  Take 1,000 Units by mouth daily.        VVS, Skin clean, dry and intact without evidence of skin break down, no evidence of skin tears noted.  IV catheter discontinued intact. Site without signs and symptoms of complications. Dressing and pressure applied.  An After Visit Summary was printed and given to the patient.  Patient escorted via stretcher, and D/C SNf via ambulance.  [place] Numbers  07/31/2014 4:44 PM

## 2014-07-31 NOTE — Clinical Social Work Psychosocial (Signed)
Clinical Social Work Department BRIEF PSYCHOSOCIAL ASSESSMENT 07/31/2014  Patient:  Bridget Mendez, Bridget Mendez     Account Number:  [id number]     Admit date:  07/29/2014  Clinical Social Worker:  Bridget Mendez  Date/Time:  07/31/2014 12:12 PM  Referred by:  Physician  Date Referred:  07/31/2014 Referred for  SNF Placement   Other Referral:   none.   Interview type:  Patient Other interview type:   none.    PSYCHOSOCIAL DATA Living Status:  FACILITY Admitted from facility:  [facility] Level of care:  Independent Living Primary support name:  Bridget Mendez Primary support relationship to patient:  CHILD, ADULT Degree of support available:   Adequate support system. Patient stated patient has strong support system from neighbors/friends at [facility].    CURRENT CONCERNS Current Concerns  Post-Acute Placement   Other Concerns:   none.    SOCIAL WORK ASSESSMENT / PLAN CSW received referral regarding [facility]F placement at time of discharge. CSW met with patient at bedside to discuss discharge disposition. Patient confirmed patient to return to [facility] and complete short-term rehabilitation before returning to [place].    CSW to continue to follow and assist with discharge planning needs.   Assessment/plan status:  Psychosocial Support/Ongoing Assessment of Needs Other assessment/ plan:   none.   Information/referral to community resources:   Patient to be discharged to [facility].  Bundle patient.    PATIENT'S/FAMILY'S RESPONSE TO PLAN OF CARE: Patient understanding and agreeable to CSW plan of care. Patient expressed no further questions or concerns at this time.       Bridget Mendez, [place] ([phone]) Licensed Clinical Social Worker Orthopedics [phone]) and Surgical [phone])

## 2014-08-04 DIAGNOSIS — F411 Generalized anxiety disorder: Secondary | ICD-10-CM

## 2014-08-04 DIAGNOSIS — M1611 Unilateral primary osteoarthritis, right hip: Secondary | ICD-10-CM | POA: Diagnosis not present

## 2014-08-11 DIAGNOSIS — M6281 Muscle weakness (generalized): Secondary | ICD-10-CM | POA: Diagnosis not present

## 2014-08-11 DIAGNOSIS — Z96641 Presence of right artificial hip joint: Secondary | ICD-10-CM | POA: Diagnosis not present

## 2014-08-11 DIAGNOSIS — Z471 Aftercare following joint replacement surgery: Secondary | ICD-10-CM | POA: Diagnosis not present

## 2014-08-11 DIAGNOSIS — R296 Repeated falls: Secondary | ICD-10-CM | POA: Diagnosis not present

## 2014-08-12 DIAGNOSIS — Z471 Aftercare following joint replacement surgery: Secondary | ICD-10-CM | POA: Diagnosis not present

## 2014-08-12 DIAGNOSIS — R296 Repeated falls: Secondary | ICD-10-CM | POA: Diagnosis not present

## 2014-08-12 DIAGNOSIS — M6281 Muscle weakness (generalized): Secondary | ICD-10-CM | POA: Diagnosis not present

## 2014-08-13 DIAGNOSIS — M6281 Muscle weakness (generalized): Secondary | ICD-10-CM | POA: Diagnosis not present

## 2014-08-13 DIAGNOSIS — Z471 Aftercare following joint replacement surgery: Secondary | ICD-10-CM | POA: Diagnosis not present

## 2014-08-13 DIAGNOSIS — R296 Repeated falls: Secondary | ICD-10-CM | POA: Diagnosis not present

## 2014-08-15 DIAGNOSIS — Z471 Aftercare following joint replacement surgery: Secondary | ICD-10-CM | POA: Diagnosis not present

## 2014-08-15 DIAGNOSIS — M6281 Muscle weakness (generalized): Secondary | ICD-10-CM | POA: Diagnosis not present

## 2014-08-15 DIAGNOSIS — R296 Repeated falls: Secondary | ICD-10-CM | POA: Diagnosis not present

## 2014-08-18 DIAGNOSIS — R296 Repeated falls: Secondary | ICD-10-CM | POA: Diagnosis not present

## 2014-08-18 DIAGNOSIS — Z471 Aftercare following joint replacement surgery: Secondary | ICD-10-CM | POA: Diagnosis not present

## 2014-08-18 DIAGNOSIS — M6281 Muscle weakness (generalized): Secondary | ICD-10-CM | POA: Diagnosis not present

## 2014-08-20 DIAGNOSIS — M6281 Muscle weakness (generalized): Secondary | ICD-10-CM | POA: Diagnosis not present

## 2014-08-20 DIAGNOSIS — Z471 Aftercare following joint replacement surgery: Secondary | ICD-10-CM | POA: Diagnosis not present

## 2014-08-20 DIAGNOSIS — R296 Repeated falls: Secondary | ICD-10-CM | POA: Diagnosis not present

## 2014-08-22 DIAGNOSIS — M6281 Muscle weakness (generalized): Secondary | ICD-10-CM | POA: Diagnosis not present

## 2014-08-22 DIAGNOSIS — Z471 Aftercare following joint replacement surgery: Secondary | ICD-10-CM | POA: Diagnosis not present

## 2014-08-22 DIAGNOSIS — R296 Repeated falls: Secondary | ICD-10-CM | POA: Diagnosis not present

## 2014-08-25 DIAGNOSIS — Z9889 Other specified postprocedural states: Secondary | ICD-10-CM | POA: Diagnosis not present

## 2014-08-26 DIAGNOSIS — Z471 Aftercare following joint replacement surgery: Secondary | ICD-10-CM | POA: Diagnosis not present

## 2014-08-26 DIAGNOSIS — R296 Repeated falls: Secondary | ICD-10-CM | POA: Diagnosis not present

## 2014-08-26 DIAGNOSIS — M6281 Muscle weakness (generalized): Secondary | ICD-10-CM | POA: Diagnosis not present

## 2014-08-28 DIAGNOSIS — M6281 Muscle weakness (generalized): Secondary | ICD-10-CM | POA: Diagnosis not present

## 2014-08-28 DIAGNOSIS — R296 Repeated falls: Secondary | ICD-10-CM | POA: Diagnosis not present

## 2014-08-28 DIAGNOSIS — Z471 Aftercare following joint replacement surgery: Secondary | ICD-10-CM | POA: Diagnosis not present

## 2014-08-29 DIAGNOSIS — Z471 Aftercare following joint replacement surgery: Secondary | ICD-10-CM | POA: Diagnosis not present

## 2014-08-29 DIAGNOSIS — M6281 Muscle weakness (generalized): Secondary | ICD-10-CM | POA: Diagnosis not present

## 2014-08-29 DIAGNOSIS — R296 Repeated falls: Secondary | ICD-10-CM | POA: Diagnosis not present

## 2014-09-01 DIAGNOSIS — R296 Repeated falls: Secondary | ICD-10-CM | POA: Diagnosis not present

## 2014-09-01 DIAGNOSIS — Z471 Aftercare following joint replacement surgery: Secondary | ICD-10-CM | POA: Diagnosis not present

## 2014-09-01 DIAGNOSIS — M6281 Muscle weakness (generalized): Secondary | ICD-10-CM | POA: Diagnosis not present

## 2014-09-04 DIAGNOSIS — M6281 Muscle weakness (generalized): Secondary | ICD-10-CM | POA: Diagnosis not present

## 2014-09-04 DIAGNOSIS — Z471 Aftercare following joint replacement surgery: Secondary | ICD-10-CM | POA: Diagnosis not present

## 2014-09-04 DIAGNOSIS — R296 Repeated falls: Secondary | ICD-10-CM | POA: Diagnosis not present

## 2014-09-05 DIAGNOSIS — Z471 Aftercare following joint replacement surgery: Secondary | ICD-10-CM | POA: Diagnosis not present

## 2014-09-05 DIAGNOSIS — R296 Repeated falls: Secondary | ICD-10-CM | POA: Diagnosis not present

## 2014-09-05 DIAGNOSIS — M6281 Muscle weakness (generalized): Secondary | ICD-10-CM | POA: Diagnosis not present

## 2014-09-10 ENCOUNTER — Ambulatory Visit (INDEPENDENT_AMBULATORY_CARE_PROVIDER_SITE_OTHER): Payer: Medicare Other | Admitting: Internal Medicine

## 2014-09-10 ENCOUNTER — Encounter: Payer: Self-pay | Admitting: Internal Medicine

## 2014-09-10 VITALS — BP 122/76 | HR 102 | Temp 97.8°F | Wt 162.8 lb

## 2014-09-10 DIAGNOSIS — F419 Anxiety disorder, unspecified: Secondary | ICD-10-CM | POA: Diagnosis not present

## 2014-09-10 DIAGNOSIS — M1611 Unilateral primary osteoarthritis, right hip: Secondary | ICD-10-CM | POA: Diagnosis not present

## 2014-09-10 MED ORDER — ALPRAZOLAM 0.25 MG PO TABS: 0.2500 mg | ORAL_TABLET | Freq: Every day | ORAL | Status: DC

## 2014-09-10 NOTE — Patient Instructions (Signed)
If the surgeon thinks you need a lift for your shoe, call for appointment with Dr Lorelei Pont here in this office---he does custom orthotics here in the office.

## 2014-09-10 NOTE — Progress Notes (Signed)
Subjective:    Patient ID: Bridget Mendez, female    DOB: Sep 06, 1919, 81 y.o.   MRN: [id number]  HPI Here for follow up after Right THR  "I am glad I did it" Still has some muscle soreness --she relates to lack of use for so long before the surgery Finished with the physical therapy Walking well with cane--still working on her stability Has been long off the pain pills Back to the yoga class  Someone is taking her to store Has house cleaner  Current Outpatient Prescriptions on File Prior to Visit  Medication Sig Dispense Refill  . ALPRAZolam (XANAX) 0.25 MG tablet Take 1 tablet (0.25 mg total) by mouth at bedtime. 100 tablet 0  . Calcium Carb-Cholecalciferol (CALCIUM 1000 + D PO) Take 1 tablet by mouth daily.     . Cholecalciferol (VITAMIN D) 1000 UNITS capsule Take 1,000 Units by mouth daily.      Marland Kitchen glucosamine-chondroitin 500-400 MG tablet Take 1 tablet by mouth daily.      . methylcellulose packet Take 1 each by mouth as needed for constipation.     . Multiple Vitamin (MULTIVITAMIN) tablet Take 2 tablets by mouth daily.     . Probiotic Product (PROBIOTIC DAILY) CAPS Take 1 capsule by mouth daily.      No current facility-administered medications on file prior to visit.    No Known Allergies  Past Medical History  Diagnosis Date  . GERD (gastroesophageal reflux disease)   . Osteoarthritis   . Osteoporosis   . Depression   . Allergy   . Anxiety     Past Surgical History  Procedure Laterality Date  . Cesarean section      x2  . Hemorrhoid surgery    . Knee arthroscopy  1998 & 2005    both knees  . Inguinal hernia repair  02/2009    Dr.Wilton Tamala Julian  . Total hip arthroplasty Right 07/29/2014    Procedure: RIGHT TOTAL HIP ARTHROPLASTY ANTERIOR APPROACH;  Surgeon: Hessie Dibble, MD;  Location: [facility];  Service: Orthopedics;  Laterality: Right;    Family History  Problem Relation Age of Onset  . Parkinsonism Mother   . Cancer Maternal Grandmother      History   Social History  . Marital Status: Married    Spouse Name: N/A  . Number of Children: 2  . Years of Education: N/A   Occupational History  . retired    Social History Main Topics  . Smoking status: Former Smoker -- 1.00 packs/day    Types: Cigarettes    Quit date: 07/11/1988  . Smokeless tobacco: Never Used  . Alcohol Use: Yes     Comment: 2 drinks in the evening  . Drug Use: No  . Sexual Activity: Not on file   Other Topics Concern  . Not on file   Social History Narrative   Has living will   Daughter, [name], is health care POA   Has DNR already--reviewed   No tube feeds if cognitively aware         Review of Systems Sleeping better now Nocturia is better now--had been every 2 hours but now sleeping 3-4 hours since doubling the alprazolam Appetite is good Weight is stable Very gassy---stopped citrucel and bowels still okay. Gave low FODMAP info Occasional vaginal odor. Does take a probiotic    Objective:   Physical Exam  Constitutional: She appears well-developed. No distress.  Musculoskeletal:  Able to stand independently well Walks stably--even  without cane for short distances          Assessment & Plan:

## 2014-09-10 NOTE — Assessment & Plan Note (Signed)
Has done very well post op Discussed talking to Legrand Como about a personalized exercise plan

## 2014-09-10 NOTE — Assessment & Plan Note (Signed)
Has done better with the 2 alprazolam at night

## 2014-09-10 NOTE — Progress Notes (Signed)
Pre visit review using our clinic review tool, if applicable. No additional management support is needed unless otherwise documented below in the visit note. 

## 2014-09-19 ENCOUNTER — Telehealth: Payer: Self-pay

## 2014-09-22 ENCOUNTER — Telehealth: Payer: Self-pay | Admitting: *Deleted

## 2014-09-22 NOTE — Telephone Encounter (Signed)
Received a fax from pharmacy showing that patient stated that she should be taking the Alprazolam 0.25 twice daily. Last refill 09/10/14 #1 tablet. Is it okay to refill?

## 2014-09-23 MED ORDER — ALPRAZOLAM 0.25 MG PO TABS: 0.2500 mg | ORAL_TABLET | Freq: Every day | ORAL | Status: DC

## 2014-09-23 NOTE — Telephone Encounter (Signed)
Okay to fill #100 x 0 1-2 tablets at bedtime prn  The #1 was when I changed the directions and I had to have some refill when I did it

## 2014-09-23 NOTE — Telephone Encounter (Signed)
rx called into pharmacy

## 2014-10-09 NOTE — Telephone Encounter (Signed)
error 

## 2014-10-20 DIAGNOSIS — M1712 Unilateral primary osteoarthritis, left knee: Secondary | ICD-10-CM | POA: Diagnosis not present

## 2014-10-20 DIAGNOSIS — Z471 Aftercare following joint replacement surgery: Secondary | ICD-10-CM | POA: Diagnosis not present

## 2014-10-20 DIAGNOSIS — Z96641 Presence of right artificial hip joint: Secondary | ICD-10-CM | POA: Diagnosis not present

## 2014-10-27 ENCOUNTER — Encounter: Payer: Self-pay | Admitting: Podiatry

## 2014-10-27 ENCOUNTER — Ambulatory Visit (INDEPENDENT_AMBULATORY_CARE_PROVIDER_SITE_OTHER): Payer: Medicare Other | Admitting: Podiatry

## 2014-10-27 VITALS — BP 118/76 | HR 98 | Resp 16 | Ht 64.0 in | Wt 160.0 lb

## 2014-10-27 DIAGNOSIS — G5762 Lesion of plantar nerve, left lower limb: Secondary | ICD-10-CM

## 2014-10-27 DIAGNOSIS — G5782 Other specified mononeuropathies of left lower limb: Secondary | ICD-10-CM

## 2014-10-27 NOTE — Progress Notes (Signed)
She presents today complaining of a painful neuroma third interdigital space of the left foot.  Objective: Pulses are palpable bilateral. She has pain on palpation with a palpable Mulder's click third interdigital space left foot.  Assessment: Neuroma third interspace left foot.  Plan: Injected Kenalog to the area. We'll follow up with her as needed.

## 2014-11-10 ENCOUNTER — Telehealth: Payer: Self-pay | Admitting: *Deleted

## 2014-11-10 MED ORDER — ALPRAZOLAM 0.5 MG PO TABS: 0.5000 mg | ORAL_TABLET | Freq: Every evening | ORAL | Status: DC | PRN

## 2014-11-10 NOTE — Telephone Encounter (Signed)
Okay to change to 0.5mg  dose 1 at bedtime #60 x 0

## 2014-11-10 NOTE — Telephone Encounter (Signed)
Pharmacist at [facility] left a voicemail stating that he needs a new script for Alprazolam 0.5 mg. Patient told pharmacist that she has been taking 0.25 mg and Dr. Silvio Pate mentioned that he would be changing it to 0.5 mg so she will not have to take 2 a day. Pharmacist requested a new script be called in with directions.  Last refill on 0.285 mg 09/23/14 #100.

## 2014-11-10 NOTE — Telephone Encounter (Signed)
rx called into pharmacy

## 2014-11-12 ENCOUNTER — Telehealth: Payer: Self-pay | Admitting: *Deleted

## 2014-11-12 NOTE — Telephone Encounter (Signed)
She would need to fu with [place] for another evaluation and possible change of injectable.

## 2014-11-12 NOTE — Telephone Encounter (Signed)
Pt states the injection she received 10/27/2014 in the toe did not help.

## 2014-11-13 NOTE — Telephone Encounter (Signed)
LEFT MESSAGE FOR PATIENT TO FOLLOW UP WITH DR Milinda Pointer ,  CALL FOR A APPOINTMENT

## 2014-12-15 ENCOUNTER — Ambulatory Visit (INDEPENDENT_AMBULATORY_CARE_PROVIDER_SITE_OTHER): Payer: Medicare Other | Admitting: Podiatry

## 2014-12-15 ENCOUNTER — Encounter: Payer: Self-pay | Admitting: Podiatry

## 2014-12-15 VITALS — BP 143/83 | HR 92 | Resp 16 | Ht 64.0 in | Wt 160.0 lb

## 2014-12-15 DIAGNOSIS — G5762 Lesion of plantar nerve, left lower limb: Secondary | ICD-10-CM | POA: Diagnosis not present

## 2014-12-15 DIAGNOSIS — G5782 Other specified mononeuropathies of left lower limb: Secondary | ICD-10-CM

## 2014-12-15 NOTE — Progress Notes (Signed)
She presents today to discuss the mortality of her second toe left foot. She states that just does not seem to be getting any better the pain comes and goes and the toe seems to be drifting more medially. She does not want to entertain surgery.  Objective: Pulses remain palpable left. Rigid hammertoe deformity with contracted second metatarsophalangeal joint and medial deviation of the toe left foot.  Assessment: hammertoe deformity with rigid PIPJ contraction a medial deviation of the toe.  Plan: I will follow-up with her on an as-needed basis. I discussed surgical intervention such as a dictation of the toe and she does not want to consider this. Otherwise there is no way to eliminate her symptoms I'll G other than appropriate shoe gear which we did discuss in great detail.

## 2014-12-16 ENCOUNTER — Ambulatory Visit: Payer: PRIVATE HEALTH INSURANCE | Admitting: Family Medicine

## 2014-12-17 ENCOUNTER — Encounter: Payer: Self-pay | Admitting: Internal Medicine

## 2014-12-17 ENCOUNTER — Telehealth: Payer: Self-pay | Admitting: Internal Medicine

## 2014-12-17 ENCOUNTER — Ambulatory Visit (INDEPENDENT_AMBULATORY_CARE_PROVIDER_SITE_OTHER): Payer: Medicare Other | Admitting: Internal Medicine

## 2014-12-17 VITALS — BP 130/70 | HR 101 | Temp 98.2°F | Ht 64.0 in | Wt 165.0 lb

## 2014-12-17 DIAGNOSIS — M150 Primary generalized (osteo)arthritis: Secondary | ICD-10-CM

## 2014-12-17 DIAGNOSIS — Z0000 Encounter for general adult medical examination without abnormal findings: Secondary | ICD-10-CM

## 2014-12-17 DIAGNOSIS — M159 Polyosteoarthritis, unspecified: Secondary | ICD-10-CM

## 2014-12-17 DIAGNOSIS — M810 Age-related osteoporosis without current pathological fracture: Secondary | ICD-10-CM | POA: Diagnosis not present

## 2014-12-17 DIAGNOSIS — F39 Unspecified mood [affective] disorder: Secondary | ICD-10-CM

## 2014-12-17 DIAGNOSIS — Z7189 Other specified counseling: Secondary | ICD-10-CM

## 2014-12-17 LAB — T4, FREE: Free T4: 0.83 ng/dL (ref 0.60–1.60)

## 2014-12-17 LAB — CBC WITH DIFFERENTIAL/PLATELET
BASOS ABS: 0 10*3/uL (ref 0.0–0.1)
Basophils Relative: 0.3 % (ref 0.0–3.0)
EOS ABS: 0.1 10*3/uL (ref 0.0–0.7)
Eosinophils Relative: 1.2 % (ref 0.0–5.0)
HEMATOCRIT: 40.5 % (ref 36.0–46.0)
HEMOGLOBIN: 13.5 g/dL (ref 12.0–15.0)
LYMPHS ABS: 1.6 10*3/uL (ref 0.7–4.0)
Lymphocytes Relative: 19.6 % (ref 12.0–46.0)
MCHC: 33.3 g/dL (ref 30.0–36.0)
MCV: 93.7 fl (ref 78.0–100.0)
Monocytes Absolute: 1.1 10*3/uL — ABNORMAL HIGH (ref 0.1–1.0)
Monocytes Relative: 12.7 % — ABNORMAL HIGH (ref 3.0–12.0)
NEUTROS PCT: 66.2 % (ref 43.0–77.0)
Neutro Abs: 5.5 10*3/uL (ref 1.4–7.7)
PLATELETS: 369 10*3/uL (ref 150.0–400.0)
RBC: 4.32 Mil/uL (ref 3.87–5.11)
RDW: 14.9 % (ref 11.5–15.5)
WBC: 8.4 10*3/uL (ref 4.0–10.5)

## 2014-12-17 LAB — COMPREHENSIVE METABOLIC PANEL
ALBUMIN: 4.2 g/dL (ref 3.5–5.2)
ALK PHOS: 80 U/L (ref 39–117)
ALT: 12 U/L (ref 0–35)
AST: 17 U/L (ref 0–37)
BUN: 26 mg/dL — ABNORMAL HIGH (ref 6–23)
CHLORIDE: 101 meq/L (ref 96–112)
CO2: 31 mEq/L (ref 19–32)
Calcium: 9.8 mg/dL (ref 8.4–10.5)
Creatinine, Ser: 0.96 mg/dL (ref 0.40–1.20)
GFR: 57.66 mL/min — AB (ref >60.00)
Glucose, Bld: 92 mg/dL (ref 70–99)
Potassium: 4.2 mEq/L (ref 3.5–5.1)
Sodium: 137 mEq/L (ref 135–145)
Total Bilirubin: 0.6 mg/dL (ref 0.2–1.2)
Total Protein: 7 g/dL (ref 6.0–8.3)

## 2014-12-17 NOTE — Assessment & Plan Note (Signed)
Mostly mild anxiety and sleep problems Does okay with alprazolam at bedtime

## 2014-12-17 NOTE — Progress Notes (Signed)
Pre visit review using our clinic review tool, if applicable. No additional management support is needed unless otherwise documented below in the visit note. 

## 2014-12-17 NOTE — Telephone Encounter (Signed)
Chart updated

## 2014-12-17 NOTE — Progress Notes (Signed)
Subjective:    Patient ID: Bridget Mendez, female    DOB: [birth year], 81 y.o.   MRN: [id number]  HPI Here for Medicare wellness and follow up of chronic medical conditions Reviewed form and advanced directives Reviewed other physicians 1-2 drinks of whiskey each evening No tobacco Tries to stay active and does yoga Vision is okay--not sure if glasses should be changed Hearing is okay with hearing aides Maintains independence with instrumental ADLs Feels her memory is fine No falls No depression or anhedonia  Has done well after the hip replacement Finished therapy Still uses a cane--to help with balance. Walker in house No pain in hip now Also some right knee pain--surgeon checked x-ray and bad arthritis there Is planning to see Dr Lorelei Pont to check leg lengths  Mood is fine Uses the alprazolam nightly to help sleep  Hasn't needed during the day Anxiety has been controlled in general  Takes the vitamin D and calcium Walks regularly--very busy  Current Outpatient Prescriptions on File Prior to Visit  Medication Sig Dispense Refill  . ALPRAZolam (XANAX) 0.5 MG tablet Take 1 tablet (0.5 mg total) by mouth at bedtime as needed. 60 tablet 0  . aspirin 81 MG tablet Take 81 mg by mouth daily.    . Calcium Carb-Cholecalciferol (CALCIUM 1000 + D PO) Take 1 tablet by mouth daily.     . Cholecalciferol (VITAMIN D) 1000 UNITS capsule Take 1,000 Units by mouth daily.      Marland Kitchen glucosamine-chondroitin 500-400 MG tablet Take 1 tablet by mouth daily.      . Multiple Vitamin (MULTIVITAMIN) tablet Take 2 tablets by mouth daily.     . Probiotic Product (PROBIOTIC DAILY) CAPS Take 1 capsule by mouth daily.      No current facility-administered medications on file prior to visit.    No Known Allergies  Past Medical History  Diagnosis Date  . GERD (gastroesophageal reflux disease)   . Osteoarthritis   . Osteoporosis   . Depression   . Allergy   . Anxiety     Past Surgical History   Procedure Laterality Date  . Cesarean section      x2  . Hemorrhoid surgery    . Knee arthroscopy  1998 & 2005    both knees  . Inguinal hernia repair  02/2009    Dr.Wilton Tamala Julian  . Total hip arthroplasty Right 07/29/2014    Procedure: RIGHT TOTAL HIP ARTHROPLASTY ANTERIOR APPROACH;  Surgeon: Hessie Dibble, MD;  Location: [facility];  Service: Orthopedics;  Laterality: Right;    Family History  Problem Relation Age of Onset  . Parkinsonism Mother   . Cancer Maternal Grandmother     History   Social History  . Marital Status: Married    Spouse Name: N/A  . Number of Children: 2  . Years of Education: N/A   Occupational History  . retired    Social History Main Topics  . Smoking status: Former Smoker -- 1.00 packs/day    Types: Cigarettes    Quit date: 07/11/1988  . Smokeless tobacco: Never Used  . Alcohol Use: Yes     Comment: 2 drinks in the evening  . Drug Use: No  . Sexual Activity: Not on file   Other Topics Concern  . Not on file   Social History Narrative   Has living will   Daughter, [name], is health care POA   Has DNR already--reviewed   No tube feeds if cognitively aware  Review of Systems Sleeps well with the alprazolam Has to limit liquids in evening to prevent night urinary problems. Uses walker at night for nocturia x 1 Appetite is fine Weight stable Bowels okay on citrucel No chest pain No SOB No dizziness or syncope    Objective:   Physical Exam  Constitutional: She is oriented to person, place, and time. She appears well-developed and well-nourished. No distress.  HENT:  Mouth/Throat: Oropharynx is clear and moist. No oropharyngeal exudate.  Neck: Normal range of motion. Neck supple. No thyromegaly present.  Cardiovascular: Normal rate, regular rhythm, normal heart sounds and intact distal pulses.  Exam reveals no gallop.   No murmur heard. Pulmonary/Chest: Effort normal and breath sounds normal. No respiratory distress. She  has no wheezes. She has no rales.  Abdominal: Soft. There is no tenderness.  Musculoskeletal: She exhibits no edema or tenderness.  Deformed left 2nd toe  Lymphadenopathy:    She has no cervical adenopathy.  Neurological: She is alert and oriented to person, place, and time.  President-- "Obama, Remer Macho, Bush, Reagan" [id number] Recall 2/3  Skin: No rash noted. No erythema.  Psychiatric: She has a normal mood and affect. Her behavior is normal.          Assessment & Plan:

## 2014-12-17 NOTE — Assessment & Plan Note (Signed)
See social history °Has DNR °

## 2014-12-17 NOTE — Assessment & Plan Note (Signed)
On vitamin d and calcium

## 2014-12-17 NOTE — Telephone Encounter (Signed)
Pt called she had shingles shot 07/19/05 @ the office here  She said if you need the paperwork for this please let her know.  She said the card was $248

## 2014-12-17 NOTE — Assessment & Plan Note (Signed)
Hip better since surgery Knee bad but not bad enough for surgery

## 2014-12-17 NOTE — Assessment & Plan Note (Signed)
I have personally reviewed the Medicare Annual Wellness questionnaire and have noted 1. The patient's medical and social history 2. Their use of alcohol, tobacco or illicit drugs 3. Their current medications and supplements 4. The patient's functional ability including ADL's, fall risks, home safety risks and hearing or visual             impairment. 5. Diet and physical activities 6. Evidence for depression or mood disorders  The patients weight, height, BMI and visual acuity have been recorded in the chart I have made referrals, counseling and provided education to the patient based review of the above and I have provided the pt with a written personalized care plan for preventive services.  I have provided you with a copy of your personalized plan for preventive services. Please take the time to review along with your updated medication list.  No cancer screening due to age Generally fit --especially for age Yearly flu shot

## 2014-12-19 ENCOUNTER — Encounter: Payer: Self-pay | Admitting: *Deleted

## 2014-12-24 ENCOUNTER — Encounter: Payer: Self-pay | Admitting: Family Medicine

## 2014-12-24 ENCOUNTER — Ambulatory Visit (INDEPENDENT_AMBULATORY_CARE_PROVIDER_SITE_OTHER): Payer: Medicare Other | Admitting: Family Medicine

## 2014-12-24 VITALS — BP 140/72 | HR 91 | Temp 97.8°F | Ht 64.0 in | Wt 165.5 lb

## 2014-12-24 DIAGNOSIS — M65851 Other synovitis and tenosynovitis, right thigh: Secondary | ICD-10-CM

## 2014-12-24 DIAGNOSIS — M21769 Unequal limb length (acquired), unspecified tibia and fibula: Secondary | ICD-10-CM

## 2014-12-24 DIAGNOSIS — M76891 Other specified enthesopathies of right lower limb, excluding foot: Secondary | ICD-10-CM

## 2014-12-24 NOTE — Progress Notes (Signed)
Dr. Frederico Hamman T. Martita Brumm, MD, [facility] [place], [address] Phone: [phone] Fax: [phone]  12/24/2014  Patient: Bridget Mendez, MRN: [id number], DOB: 20-Jan-1920, 81 y.o.  Primary Physician:  Viviana Simpler, MD  Chief Complaint: Discuss Putting Lift in Shoe  Subjective:   Bridget Mendez is a 81 y.o. very pleasant female patient who presents with the following:  Going to be 55 next month.  Had TKA 08 Had THA in 2016. R by Stefani Dama.   88 r L 89.5  Consultation regarding possible leg length inequality status post right total hip arthroplasty in January.  Right now, the patient's primary complaint is pain in the soft tissue of the hip on the right.  She has pain getting out of her [name] automobile, and she is having some soft tissue tenderness throughout much of her anterior medial and lateral hip.  She also is having some weakness.  She tells me that she did do some rehabilitation at home with guidance from a retired occupational therapist at [facility], but she never went any formal physical therapy after her total hip arthroplasty.  The patient is 81 years old.  Past Medical History, Surgical History, Social History, Family History, Problem List, Medications, and Allergies have been reviewed and updated if relevant.  Patient Active Problem List   Diagnosis Date Noted  . Advance directive discussed with patient 12/17/2014  . Neuroma digital nerve 04/18/2013  . Routine general medical examination at a health care facility 10/17/2011  . Episodic mood disorder 04/28/2010  . ALLERGIC RHINITIS 09/15/2008  . GERD 10/24/2006  . Primary osteoarthritis involving multiple joints 10/24/2006  . Osteoporosis 10/24/2006  . SLEEP DISORDER 10/24/2006    Past Medical History  Diagnosis Date  . GERD (gastroesophageal reflux disease)   . Osteoarthritis   . Osteoporosis   . Depression   . Allergy     . Anxiety     Past Surgical History  Procedure Laterality Date  . Cesarean section      x2  . Hemorrhoid surgery    . Knee arthroscopy  1998 & 2005    both knees  . Inguinal hernia repair  02/2009    Dr.Wilton Tamala Julian  . Total hip arthroplasty Right 07/29/2014    Procedure: RIGHT TOTAL HIP ARTHROPLASTY ANTERIOR APPROACH;  Surgeon: Hessie Dibble, MD;  Location: [facility];  Service: Orthopedics;  Laterality: Right;    History   Social History  . Marital Status: Married    Spouse Name: N/A  . Number of Children: 2  . Years of Education: N/A   Occupational History  . retired    Social History Main Topics  . Smoking status: Former Smoker -- 1.00 packs/day    Types: Cigarettes    Quit date: 07/11/1988  . Smokeless tobacco: Never Used  . Alcohol Use: Yes     Comment: 2 drinks in the evening  . Drug Use: No  . Sexual Activity: Not on file   Other Topics Concern  . Not on file   Social History Narrative   Has living will   Daughter, [name], is health care POA   Has DNR already--reviewed   No tube feeds if cognitively aware          Family History  Problem Relation Age of Onset  . Parkinsonism Mother   . Cancer Maternal Grandmother     No Known Allergies  Medication list reviewed  and updated in full in [facility].  GEN: No fevers, chills. Nontoxic. Primarily MSK c/o today. MSK: Detailed in the HPI GI: tolerating PO intake without difficulty Neuro: No numbness, parasthesias, or tingling associated. Otherwise the pertinent positives of the ROS are noted above.   Objective:   BP 140/72 mmHg  Pulse 91  Temp(Src) 97.8 F (36.6 C) (Oral)  Ht 5\' 4"  (1.626 m)  Wt 165 lb 8 oz (75.07 kg)  BMI 28.39 kg/m2   GEN: WDWN, NAD, Non-toxic, Alert & Oriented x 3 HEENT: Atraumatic, Normocephalic.  Ears and Nose: No external deformity. EXTR: No clubbing/cyanosis/edema NEURO: antalgicl gait.  PSYCH: Normally interactive. Conversant. Not depressed or anxious  appearing.  Calm demeanor.   HIP EXAM: SIDE: R ROM: Abduction, Flexion, Internal and External range of motion: not taken to extremes, but fluid motion Pain with terminal IROM and EROM: no GTB: NT SLR: NEG Knees: No effusion FABER: NT REVERSE FABER: NT, neg Piriformis: NT at direct palpation Str: flexion: 3/5 abduction: 4/5 adduction: 3+/5 Strength testing tender  Notable tenderness with palpation at the proximal hip flexor insertion into the pelvis as well as to a lesser extent the lateral pelvic musculature.  She also has some tenderness along the abductor magnus and longus.  LL: R 88 r L 89.5   Radiology: No results found.  Assessment and Plan:   Acquired inequality of length of lower legs  Hip flexor tendinitis, right  >25 minutes spent in face to face time with patient, >50% spent in counselling or coordination of care  I think that the patient's primary concern now is her significant loss of strength and what seems to be overuse tendinopathy on multiple parts of her hip on the right status post total hip replacement in January.  She has very significant decreased strength on the right compared to the left and pain with deep palpation with hip flexion and hip abduction.  She also is significantly weak on hip abduction.  She never did formal physical therapy status post total hip arthroplasty.  My recommendation was for her to go to formal physical therapy to work on all of these muscle imbalances and tendinopathy at [facility].  The patient was resistant to this idea and declined my recommendation.  Leg length inequality of 1.5 cm can sometimes cause problems, but now the patient's contralateral side is asymptomatic.  At this point, I would not do anything unless her left hip or left back started bothering her.  Follow-up: prn  Signed,  [name], MD   Patient's Medications  New Prescriptions   No medications on file  Previous Medications   ALPRAZOLAM  (XANAX) 0.5 MG TABLET    Take 1 tablet (0.5 mg total) by mouth at bedtime as needed.   ASPIRIN 81 MG TABLET    Take 81 mg by mouth daily.   CALCIUM CARB-CHOLECALCIFEROL (CALCIUM 1000 + D PO)    Take 1 tablet by mouth daily.    CHOLECALCIFEROL (VITAMIN D) 1000 UNITS CAPSULE    Take 1,000 Units by mouth daily.     GLUCOSAMINE-CHONDROITIN 500-400 MG TABLET    Take 1 tablet by mouth daily.     MULTIPLE VITAMIN (MULTIVITAMIN) TABLET    Take 2 tablets by mouth daily.    PROBIOTIC PRODUCT (PROBIOTIC DAILY) CAPS    Take 1 capsule by mouth daily.   Modified Medications   No medications on file  Discontinued Medications   No medications on file

## 2014-12-24 NOTE — Progress Notes (Signed)
Pre visit review using our clinic review tool, if applicable. No additional management support is needed unless otherwise documented below in the visit note. 

## 2015-01-14 ENCOUNTER — Other Ambulatory Visit: Payer: Self-pay | Admitting: Internal Medicine

## 2015-01-14 NOTE — Telephone Encounter (Signed)
Per the 11/10/14 phone note this was changed to #60 0.5 mg.  rx called into pharmacy

## 2015-01-14 NOTE — Telephone Encounter (Signed)
11/10/14 

## 2015-01-14 NOTE — Telephone Encounter (Signed)
Approved:okay She usually gets #100-- confirm that this is the quantity she wants

## 2015-01-29 ENCOUNTER — Ambulatory Visit (INDEPENDENT_AMBULATORY_CARE_PROVIDER_SITE_OTHER): Payer: Medicare Other | Admitting: Internal Medicine

## 2015-01-29 ENCOUNTER — Encounter: Payer: Self-pay | Admitting: Internal Medicine

## 2015-01-29 VITALS — BP 134/84 | HR 78 | Temp 98.1°F | Wt 163.0 lb

## 2015-01-29 DIAGNOSIS — R195 Other fecal abnormalities: Secondary | ICD-10-CM | POA: Diagnosis not present

## 2015-01-29 DIAGNOSIS — R140 Abdominal distension (gaseous): Secondary | ICD-10-CM | POA: Diagnosis not present

## 2015-01-29 DIAGNOSIS — R1013 Epigastric pain: Secondary | ICD-10-CM

## 2015-01-29 NOTE — Progress Notes (Signed)
Pre visit review using our clinic review tool, if applicable. No additional management support is needed unless otherwise documented below in the visit note. 

## 2015-01-29 NOTE — Progress Notes (Signed)
Subjective:    Patient ID: Bridget Mendez, female    DOB: 12-25-1919, 81 y.o.   MRN: [id number]  HPI  Pt presents to the clinic today with abdominal bloating and loose stools. This started 3-4 days ago. She reports this started 3-4 days ago after being taking out for lunch 2 days in a row to celebrate her birthday. She has had some associated epigastric pain but denies nausea and vomiting. She is having 1 BM per day, which is loose. She denies blood in her stool. She denies urinary complaints. She has tried Tums and Maalox with some relief. She does have a history of GERD but does not take any medication daily for this. She does take a probiotic daily.  Review of Systems      Past Medical History  Diagnosis Date  . GERD (gastroesophageal reflux disease)   . Osteoarthritis   . Osteoporosis   . Depression   . Allergy   . Anxiety     Current Outpatient Prescriptions  Medication Sig Dispense Refill  . ALPRAZolam (XANAX) 0.5 MG tablet take 1 tablet by mouth at bedtime 60 tablet 0  . aspirin 81 MG tablet Take 81 mg by mouth daily.    . Calcium Carb-Cholecalciferol (CALCIUM 1000 + D PO) Take 1 tablet by mouth daily.     . Cholecalciferol (VITAMIN D) 1000 UNITS capsule Take 1,000 Units by mouth daily.      Marland Kitchen glucosamine-chondroitin 500-400 MG tablet Take 1 tablet by mouth daily.      . Multiple Vitamin (MULTIVITAMIN) tablet Take 2 tablets by mouth daily.     . Probiotic Product (PROBIOTIC DAILY) CAPS Take 1 capsule by mouth daily.      No current facility-administered medications for this visit.    No Known Allergies  Family History  Problem Relation Age of Onset  . Parkinsonism Mother   . Cancer Maternal Grandmother     History   Social History  . Marital Status: Married    Spouse Name: N/A  . Number of Children: 2  . Years of Education: N/A   Occupational History  . retired    Social History Main Topics  . Smoking status: Former Smoker -- 1.00 packs/day   Types: Cigarettes    Quit date: 07/11/1988  . Smokeless tobacco: Never Used  . Alcohol Use: Yes     Comment: 2 drinks in the evening  . Drug Use: No  . Sexual Activity: Not on file   Other Topics Concern  . Not on file   Social History Narrative   Has living will   Daughter, [name], is health care POA   Has DNR already--reviewed   No tube feeds if cognitively aware           Constitutional: Denies fever, malaise, fatigue, headache or abrupt weight changes.  Respiratory: Denies difficulty breathing, shortness of breath, cough or sputum production.   Cardiovascular: Denies chest pain, chest tightness, palpitations or swelling in the hands or feet.  Gastrointestinal: Pt reports epigastric pain, bloating, and loose stool. Denies constipation, diarrhea or blood in the stool.  GU: Denies urgency, frequency, pain with urination, burning sensation, blood in urine, odor or discharge.   No other specific complaints in a complete review of systems (except as listed in HPI above).  Objective:   Physical Exam   BP 134/84 mmHg  Pulse 78  Temp(Src) 98.1 F (36.7 C) (Oral)  Wt 163 lb (73.936 kg)  SpO2 97% Wt Readings from  Last 3 Encounters:  01/29/15 163 lb (73.936 kg)  12/24/14 165 lb 8 oz (75.07 kg)  12/17/14 165 lb (74.844 kg)    General: Appears her stated age, well developed, well nourished in NAD. Cardiovascular: Normal rate and rhythm. S1,S2 noted.   Pulmonary/Chest: Normal effort and positive vesicular breath sounds. No respiratory distress. No wheezes, rales or ronchi noted.  Abdomen: Soft and mildly tender in the epigastric area. NHyperactive bowel sounds, no bruits noted. No distention or masses noted.  Neurological: Alert and oriented.   BMET    Component Value Date/Time   NA 137 12/17/2014 1123   K 4.2 12/17/2014 1123   CL 101 12/17/2014 1123   CO2 31 12/17/2014 1123   GLUCOSE 92 12/17/2014 1123   BUN 26* 12/17/2014 1123   CREATININE 0.96 12/17/2014 1123    CALCIUM 9.8 12/17/2014 1123   GFRNONAA 56* 07/30/2014 0549   GFRAA 65* 07/30/2014 0549    Lipid Panel  No results found for: CHOL, TRIG, HDL, CHOLHDL, VLDL, LDLCALC  CBC    Component Value Date/Time   WBC 8.4 12/17/2014 1123   RBC 4.32 12/17/2014 1123   HGB 13.5 12/17/2014 1123   HCT 40.5 12/17/2014 1123   PLT 369.0 12/17/2014 1123   MCV 93.7 12/17/2014 1123   MCH 31.3 07/31/2014 0608   MCHC 33.3 12/17/2014 1123   RDW 14.9 12/17/2014 1123   LYMPHSABS 1.6 12/17/2014 1123   MONOABS 1.1* 12/17/2014 1123   EOSABS 0.1 12/17/2014 1123   BASOSABS 0.0 12/17/2014 1123    Hgb A1C No results found for: HGBA1C      Assessment & Plan:   Epigastric pain, bloating, loose stools:  Likely r/t GERD Start Zantac 150 mg daily at bedtime Consume a bland diet for the next 48 hours Return precautions given  RTC as needed or if symptoms persist or worsen

## 2015-01-29 NOTE — Patient Instructions (Signed)

## 2015-02-10 ENCOUNTER — Ambulatory Visit (INDEPENDENT_AMBULATORY_CARE_PROVIDER_SITE_OTHER): Payer: Medicare Other | Admitting: Internal Medicine

## 2015-02-10 ENCOUNTER — Encounter: Payer: Self-pay | Admitting: Internal Medicine

## 2015-02-10 VITALS — BP 110/60 | HR 64 | Wt 165.0 lb

## 2015-02-10 DIAGNOSIS — M150 Primary generalized (osteo)arthritis: Secondary | ICD-10-CM | POA: Diagnosis not present

## 2015-02-10 DIAGNOSIS — K589 Irritable bowel syndrome without diarrhea: Secondary | ICD-10-CM | POA: Diagnosis not present

## 2015-02-10 DIAGNOSIS — M159 Polyosteoarthritis, unspecified: Secondary | ICD-10-CM

## 2015-02-10 DIAGNOSIS — F39 Unspecified mood [affective] disorder: Secondary | ICD-10-CM

## 2015-02-10 NOTE — Assessment & Plan Note (Signed)
Chronic problem but seems worse recently No evidence of ongoing infectious issue Discussed low FODMAP diet--but she dismissed this May want to cut back on the ensure in case that is contributing to problem

## 2015-02-10 NOTE — Progress Notes (Signed)
Subjective:    Patient ID: Bridget Mendez, female    DOB: [birth year], 81 y.o.   MRN: [id number]  HPI Here due to ongoing GI problems Tried the zantac and it didn't help  Tough year with bad pain and then surgery and extended therapy and rehab Feels she has gotten overtired at times Has a whole list of stressful things that have happened to her over the past 6 months (repairs to house, many household appliances, water damage, etc)  Had contact with other [facility] resident with GI bug This was 7/13  Had palpitation 3 days ago while cleaning chicken standing at counter Then had dizzy spell at grocery store today  Had normal stool this AM--then another loose stool after lunch (no recent incontinence) No blood in stool Has been taking ensure and tums Thinks she is having her IBS stuff--doesn't feel like she has infection Eating is variable. 1 ensure a day. Has chronic epigastric soreness and gas Lots of burping No fever No nausea or vomiting  Uses the alprazolam every night Not taking during the day  Current Outpatient Prescriptions on File Prior to Visit  Medication Sig Dispense Refill  . ALPRAZolam (XANAX) 0.5 MG tablet take 1 tablet by mouth at bedtime 60 tablet 0  . aspirin 81 MG tablet Take 81 mg by mouth daily.    . Calcium Carb-Cholecalciferol (CALCIUM 1000 + D PO) Take 1 tablet by mouth daily.     . Cholecalciferol (VITAMIN D) 1000 UNITS capsule Take 1,000 Units by mouth daily.      Marland Kitchen glucosamine-chondroitin 500-400 MG tablet Take 1 tablet by mouth daily.      . Multiple Vitamin (MULTIVITAMIN) tablet Take 2 tablets by mouth daily.     . Probiotic Product (PROBIOTIC DAILY) CAPS Take 1 capsule by mouth daily.      No current facility-administered medications on file prior to visit.    No Known Allergies  Past Medical History  Diagnosis Date  . GERD (gastroesophageal reflux disease)   . Osteoarthritis   . Osteoporosis   . Depression   . Allergy   . Anxiety      Past Surgical History  Procedure Laterality Date  . Cesarean section      x2  . Hemorrhoid surgery    . Knee arthroscopy  1998 & 2005    both knees  . Inguinal hernia repair  02/2009    Dr.Wilton Tamala Julian  . Total hip arthroplasty Right 07/29/2014    Procedure: RIGHT TOTAL HIP ARTHROPLASTY ANTERIOR APPROACH;  Surgeon: Hessie Dibble, MD;  Location: [facility];  Service: Orthopedics;  Laterality: Right;    Family History  Problem Relation Age of Onset  . Parkinsonism Mother   . Cancer Maternal Grandmother     History   Social History  . Marital Status: Married    Spouse Name: N/A  . Number of Children: 2  . Years of Education: N/A   Occupational History  . retired    Social History Main Topics  . Smoking status: Former Smoker -- 1.00 packs/day    Types: Cigarettes    Quit date: 07/11/1988  . Smokeless tobacco: Never Used  . Alcohol Use: Yes     Comment: 2 drinks in the evening  . Drug Use: No  . Sexual Activity: Not on file   Other Topics Concern  . Not on file   Social History Narrative   Has living will   Daughter, Bridget Mendez, is health care POA  Has DNR already--reviewed   No tube feeds if cognitively aware         Review of Systems Balance is off some Sleep is okay with the med    Objective:   Physical Exam  Constitutional: She appears well-developed and well-nourished. No distress.  Neck: Normal range of motion. Neck supple. No thyromegaly present.  Cardiovascular: Normal rate, regular rhythm and normal heart sounds.  Exam reveals no gallop.   No murmur heard. Pulmonary/Chest: Effort normal and breath sounds normal. No respiratory distress. She has no wheezes. She has no rales.  Abdominal: Soft. She exhibits no distension and no mass. There is no tenderness. There is no rebound and no guarding.  Hyperactive bowel sounds  Musculoskeletal: She exhibits no edema.  Lymphadenopathy:    She has no cervical adenopathy.  Psychiatric:  Mild anxiety Slightly  pressured speech Not depressed          Assessment & Plan:

## 2015-02-10 NOTE — Assessment & Plan Note (Signed)
Lots of recent situational stress No indication for regular medication May benefit from counseling--no church (asked her to consider seeing Dr Cherylin Mylar or other counselor at [facility])

## 2015-02-10 NOTE — Assessment & Plan Note (Signed)
Pain has been better since surgery

## 2015-02-10 NOTE — Progress Notes (Signed)
Pre visit review using our clinic review tool, if applicable. No additional management support is needed unless otherwise documented below in the visit note. 

## 2015-02-16 DIAGNOSIS — L700 Acne vulgaris: Secondary | ICD-10-CM | POA: Diagnosis not present

## 2015-02-20 DIAGNOSIS — H40003 Preglaucoma, unspecified, bilateral: Secondary | ICD-10-CM | POA: Diagnosis not present

## 2015-03-10 ENCOUNTER — Other Ambulatory Visit: Payer: Self-pay | Admitting: Internal Medicine

## 2015-03-10 NOTE — Telephone Encounter (Signed)
rx called into pharmacy

## 2015-03-10 NOTE — Telephone Encounter (Signed)
Approved: okay to refill Find out if she wants #100--- I thought that is what she usually got

## 2015-03-10 NOTE — Telephone Encounter (Signed)
01/14/2015 

## 2015-03-19 ENCOUNTER — Ambulatory Visit (INDEPENDENT_AMBULATORY_CARE_PROVIDER_SITE_OTHER): Payer: Medicare Other | Admitting: Internal Medicine

## 2015-03-19 ENCOUNTER — Telehealth: Payer: Self-pay

## 2015-03-19 ENCOUNTER — Encounter: Payer: Self-pay | Admitting: Internal Medicine

## 2015-03-19 VITALS — BP 148/78 | HR 97 | Wt 164.0 lb

## 2015-03-19 DIAGNOSIS — M79605 Pain in left leg: Secondary | ICD-10-CM | POA: Diagnosis not present

## 2015-03-19 NOTE — Telephone Encounter (Signed)
Pt left v/m; pt has pulled muscle in lt calf area and when sits has soreness on lt side of buttocks and request order to physical therapy at [facility] for heat and eval by PT. No hip or back pain. No known injury. Offered pt appt to be seen at [facility] today but Pt cannot come in for appt due to no transportation. Pt request cb.

## 2015-03-19 NOTE — Telephone Encounter (Signed)
Pt scheduled appt today at 4:00

## 2015-03-19 NOTE — Telephone Encounter (Signed)
Okay to have PT evaluate her at least. They will suggest appt if they are concerned about her status Order written--please fax

## 2015-03-19 NOTE — Progress Notes (Signed)
Pre visit review using our clinic review tool, if applicable. No additional management support is needed unless otherwise documented below in the visit note. 

## 2015-03-19 NOTE — Progress Notes (Signed)
   Subjective:    Patient ID: Bridget Mendez, female    DOB: 1920-01-02, 81 y.o.   MRN: [id number]  HPI Here due to leg and buttock pain  Pain "hit me yesterday" Mostly in left buttock Some tenderness in distal posterior left calf also Noticed after getting down into neighbor's small car Slight radiation to thigh--but not much  Worse if she is standing Tried 2 tylenol yesterday and heat Some ROM exercises  Current Outpatient Prescriptions on File Prior to Visit  Medication Sig Dispense Refill  . ALPRAZolam (XANAX) 0.5 MG tablet take 1 tablet by mouth at bedtime 60 tablet 0  . aspirin 81 MG tablet Take 81 mg by mouth daily.    . Calcium Carb-Cholecalciferol (CALCIUM 1000 + D PO) Take 1 tablet by mouth daily.     . Cholecalciferol (VITAMIN D) 1000 UNITS capsule Take 1,000 Units by mouth daily.      Marland Kitchen glucosamine-chondroitin 500-400 MG tablet Take 1 tablet by mouth daily.      . Multiple Vitamin (MULTIVITAMIN) tablet Take 2 tablets by mouth daily.     . Probiotic Product (PROBIOTIC DAILY) CAPS Take 1 capsule by mouth daily.      No current facility-administered medications on file prior to visit.    No Known Allergies  Past Medical History  Diagnosis Date  . GERD (gastroesophageal reflux disease)   . Osteoarthritis   . Osteoporosis   . Depression   . Allergy   . Anxiety     Past Surgical History  Procedure Laterality Date  . Cesarean section      x2  . Hemorrhoid surgery    . Knee arthroscopy  1998 & 2005    both knees  . Inguinal hernia repair  02/2009    Dr.Wilton Tamala Julian  . Total hip arthroplasty Right 07/29/2014    Procedure: RIGHT TOTAL HIP ARTHROPLASTY ANTERIOR APPROACH;  Surgeon: Hessie Dibble, MD;  Location: [facility];  Service: Orthopedics;  Laterality: Right;    Family History  Problem Relation Age of Onset  . Parkinsonism Mother   . Cancer Maternal Grandmother     Social History   Social History  . Marital Status: Married    Spouse Name: N/A  .  Number of Children: 2  . Years of Education: N/A   Occupational History  . retired    Social History Main Topics  . Smoking status: Former Smoker -- 1.00 packs/day    Types: Cigarettes    Quit date: 07/11/1988  . Smokeless tobacco: Never Used  . Alcohol Use: Yes     Comment: 2 drinks in the evening  . Drug Use: No  . Sexual Activity: Not on file   Other Topics Concern  . Not on file   Social History Narrative   Has living will   Daughter, [name], is health care POA   Has DNR already--reviewed   No tube feeds if cognitively aware         Review of Systems  No troubles with bowels No bladder control problems     Objective:   Physical Exam  Constitutional: She appears well-developed and well-nourished. No distress.  Musculoskeletal:  No spine tenderness Left upper thigh tenderness noted-- and seems to spasm Seems to have fairly normal left hip ROM--without the pain  Neurological:  Stiff, antalgic gait but loosens up Full weight bearing No focal weakness          Assessment & Plan:

## 2015-03-19 NOTE — Assessment & Plan Note (Signed)
Either hamstring injury or IT pull Doesn't seem to be arthritis Discussed tylenol, aleve, heat PT if not better by next week

## 2015-03-19 NOTE — Patient Instructions (Signed)
You can take acetaminophen 500mg -- 2 tablets up to three times a day. You can take 1 aleve twice a day in addition Try heat to your leg several times a day If you are not any better by Monday, go ahead and start with physical therapy.

## 2015-03-23 ENCOUNTER — Telehealth: Payer: Self-pay | Admitting: Internal Medicine

## 2015-03-23 DIAGNOSIS — S76302A Unspecified injury of muscle, fascia and tendon of the posterior muscle group at thigh level, left thigh, initial encounter: Secondary | ICD-10-CM | POA: Diagnosis not present

## 2015-03-23 NOTE — Telephone Encounter (Signed)
Patient wanted the doctor to know that she's going to see Dr. Rhona Raider at [facility] this morning.

## 2015-03-23 NOTE — Telephone Encounter (Signed)
Okay I will await his evaluation

## 2015-03-23 NOTE — Telephone Encounter (Signed)
Okay I thought it was not her hip---glad to hear it was just muscular as I had suspected

## 2015-03-23 NOTE — Telephone Encounter (Signed)
Pt left v/m with FYI only for Dr Silvio Pate; pt saw Dr Rhona Raider and pt has problem with pulled hamstring; pt will start therapy on 03/24/15. Pt does not require cb.

## 2015-03-24 DIAGNOSIS — S76312D Strain of muscle, fascia and tendon of the posterior muscle group at thigh level, left thigh, subsequent encounter: Secondary | ICD-10-CM | POA: Diagnosis not present

## 2015-03-26 DIAGNOSIS — S76312D Strain of muscle, fascia and tendon of the posterior muscle group at thigh level, left thigh, subsequent encounter: Secondary | ICD-10-CM | POA: Diagnosis not present

## 2015-03-27 DIAGNOSIS — S76312D Strain of muscle, fascia and tendon of the posterior muscle group at thigh level, left thigh, subsequent encounter: Secondary | ICD-10-CM | POA: Diagnosis not present

## 2015-03-30 DIAGNOSIS — S76312D Strain of muscle, fascia and tendon of the posterior muscle group at thigh level, left thigh, subsequent encounter: Secondary | ICD-10-CM | POA: Diagnosis not present

## 2015-04-01 DIAGNOSIS — S76312D Strain of muscle, fascia and tendon of the posterior muscle group at thigh level, left thigh, subsequent encounter: Secondary | ICD-10-CM | POA: Diagnosis not present

## 2015-04-03 DIAGNOSIS — S76312D Strain of muscle, fascia and tendon of the posterior muscle group at thigh level, left thigh, subsequent encounter: Secondary | ICD-10-CM | POA: Diagnosis not present

## 2015-04-06 DIAGNOSIS — S76312D Strain of muscle, fascia and tendon of the posterior muscle group at thigh level, left thigh, subsequent encounter: Secondary | ICD-10-CM | POA: Diagnosis not present

## 2015-04-08 DIAGNOSIS — S76312D Strain of muscle, fascia and tendon of the posterior muscle group at thigh level, left thigh, subsequent encounter: Secondary | ICD-10-CM | POA: Diagnosis not present

## 2015-04-13 DIAGNOSIS — S76312D Strain of muscle, fascia and tendon of the posterior muscle group at thigh level, left thigh, subsequent encounter: Secondary | ICD-10-CM | POA: Diagnosis not present

## 2015-04-14 DIAGNOSIS — S76312D Strain of muscle, fascia and tendon of the posterior muscle group at thigh level, left thigh, subsequent encounter: Secondary | ICD-10-CM | POA: Diagnosis not present

## 2015-04-22 DIAGNOSIS — Z1283 Encounter for screening for malignant neoplasm of skin: Secondary | ICD-10-CM | POA: Diagnosis not present

## 2015-04-22 DIAGNOSIS — L821 Other seborrheic keratosis: Secondary | ICD-10-CM | POA: Diagnosis not present

## 2015-04-22 DIAGNOSIS — L812 Freckles: Secondary | ICD-10-CM | POA: Diagnosis not present

## 2015-04-22 DIAGNOSIS — Z85820 Personal history of malignant melanoma of skin: Secondary | ICD-10-CM | POA: Diagnosis not present

## 2015-04-22 DIAGNOSIS — Z85828 Personal history of other malignant neoplasm of skin: Secondary | ICD-10-CM | POA: Diagnosis not present

## 2015-04-22 DIAGNOSIS — L570 Actinic keratosis: Secondary | ICD-10-CM | POA: Diagnosis not present

## 2015-04-22 DIAGNOSIS — D229 Melanocytic nevi, unspecified: Secondary | ICD-10-CM | POA: Diagnosis not present

## 2015-04-22 DIAGNOSIS — L308 Other specified dermatitis: Secondary | ICD-10-CM | POA: Diagnosis not present

## 2015-04-22 DIAGNOSIS — L578 Other skin changes due to chronic exposure to nonionizing radiation: Secondary | ICD-10-CM | POA: Diagnosis not present

## 2015-04-22 DIAGNOSIS — D1800 Hemangioma unspecified site: Secondary | ICD-10-CM | POA: Diagnosis not present

## 2015-04-22 DIAGNOSIS — D485 Neoplasm of uncertain behavior of skin: Secondary | ICD-10-CM | POA: Diagnosis not present

## 2015-05-06 DIAGNOSIS — M1712 Unilateral primary osteoarthritis, left knee: Secondary | ICD-10-CM | POA: Diagnosis not present

## 2015-05-07 DIAGNOSIS — Z23 Encounter for immunization: Secondary | ICD-10-CM | POA: Diagnosis not present

## 2015-05-09 ENCOUNTER — Other Ambulatory Visit: Payer: Self-pay | Admitting: Internal Medicine

## 2015-05-11 NOTE — Telephone Encounter (Signed)
03/10/2015 

## 2015-05-11 NOTE — Telephone Encounter (Signed)
rx called into pharmacy

## 2015-05-11 NOTE — Telephone Encounter (Signed)
Approved: Okay to fill I thought she usually gets #100----so check with her

## 2015-05-18 ENCOUNTER — Ambulatory Visit (INDEPENDENT_AMBULATORY_CARE_PROVIDER_SITE_OTHER): Payer: Medicare Other | Admitting: Internal Medicine

## 2015-05-18 ENCOUNTER — Encounter: Payer: Self-pay | Admitting: Internal Medicine

## 2015-05-18 VITALS — BP 130/80 | HR 93 | Temp 98.3°F | Wt 166.0 lb

## 2015-05-18 DIAGNOSIS — I951 Orthostatic hypotension: Secondary | ICD-10-CM | POA: Diagnosis not present

## 2015-05-18 NOTE — Progress Notes (Signed)
Subjective:    Patient ID: Bridget Mendez, female    DOB: [birth year], 81 y.o.   MRN: [id number]  HPI Here due to dizziness  "My knee is shot, I'm upset about this" Seeing Dr Rollene Fare considering surgery Has been limping and using cane and walker  Has had some "attacks" First in pre-op for hip in January Had 1 in chair at rehab --got sense of "blank white outside" briefly. This hasn't recurred Since then, multiple spells (5-10) over the year Gets funny feeling and has to sit-- usually better in 5 minutes. Always when standing  No sense of movement-- just feels weak and warm No sweats, no nausea No chest pain  No SOB  Has cut back on liquids--especially in evening Not sure how she has eaten before the spells  Current Outpatient Prescriptions on File Prior to Visit  Medication Sig Dispense Refill  . ALPRAZolam (XANAX) 0.5 MG tablet take 1 to 2 tablets by mouth at bedtime if needed 60 tablet 0  . aspirin 81 MG tablet Take 81 mg by mouth daily.    . Calcium Carb-Cholecalciferol (CALCIUM 1000 + D PO) Take 1 tablet by mouth daily.     . Cholecalciferol (VITAMIN D) 1000 UNITS capsule Take 1,000 Units by mouth daily.      Marland Kitchen glucosamine-chondroitin 500-400 MG tablet Take 1 tablet by mouth daily.      . Multiple Vitamin (MULTIVITAMIN) tablet Take 2 tablets by mouth daily.     . Probiotic Product (PROBIOTIC DAILY) CAPS Take 1 capsule by mouth daily.      No current facility-administered medications on file prior to visit.    No Known Allergies  Past Medical History  Diagnosis Date  . GERD (gastroesophageal reflux disease)   . Osteoarthritis   . Osteoporosis   . Depression   . Allergy   . Anxiety     Past Surgical History  Procedure Laterality Date  . Cesarean section      x2  . Hemorrhoid surgery    . Knee arthroscopy  1998 & 2005    both knees  . Inguinal hernia repair  02/2009    Dr.Wilton Tamala Julian  . Total hip arthroplasty Right 07/29/2014    Procedure: RIGHT TOTAL  HIP ARTHROPLASTY ANTERIOR APPROACH;  Surgeon: Hessie Dibble, MD;  Location: [facility];  Service: Orthopedics;  Laterality: Right;    Family History  Problem Relation Age of Onset  . Parkinsonism Mother   . Cancer Maternal Grandmother     Social History   Social History  . Marital Status: Married    Spouse Name: N/A  . Number of Children: 2  . Years of Education: N/A   Occupational History  . retired    Social History Main Topics  . Smoking status: Former Smoker -- 1.00 packs/day    Types: Cigarettes    Quit date: 07/11/1988  . Smokeless tobacco: Never Used  . Alcohol Use: Yes     Comment: 2 drinks in the evening  . Drug Use: No  . Sexual Activity: Not on file   Other Topics Concern  . Not on file   Social History Narrative   Has living will   Daughter, [name], is health care POA   Has DNR already--reviewed   No tube feeds if cognitively aware         Review of Systems Appetite is fine Weight is up 2# Sleeps well with the alprazolam Nocturia x 2 if she doesn't drink much Mood  is okay--concerns about her family but nothing new    Objective:   Physical Exam  Constitutional: She appears well-developed and well-nourished. No distress.  Neck: Normal range of motion. Neck supple. No thyromegaly present.  Cardiovascular: Normal rate and regular rhythm.  Exam reveals no gallop.   Soft aortic systolic murmur  Pulmonary/Chest: Effort normal and breath sounds normal. No respiratory distress. She has no wheezes. She has no rales.  Abdominal: Soft. There is no tenderness.  Musculoskeletal: She exhibits no edema or tenderness.  Lymphadenopathy:    She has no cervical adenopathy.  Psychiatric: She has a normal mood and affect. Her behavior is normal.          Assessment & Plan:

## 2015-05-18 NOTE — Assessment & Plan Note (Signed)
BP supine 142/82 with pulse of 72 Standing 118/86 and pulse of 84 No symptoms with this No regular meds No other neuropathy symptoms Discussed not missing meals and increasing fluid intake in AM  She knows to sit immediately with these symptoms Always resolves quickly No meds unless she worsens

## 2015-05-18 NOTE — Progress Notes (Signed)
Pre visit review using our clinic review tool, if applicable. No additional management support is needed unless otherwise documented below in the visit note. 

## 2015-06-28 ENCOUNTER — Other Ambulatory Visit: Payer: Self-pay | Admitting: Internal Medicine

## 2015-06-29 NOTE — Telephone Encounter (Signed)
Last filled 05/11/2015 LETVAK PATIENT, Please send back to me for call in

## 2015-06-30 NOTE — Telephone Encounter (Signed)
rx called into pharmacy

## 2015-06-30 NOTE — Telephone Encounter (Signed)
Will route to PCP 

## 2015-06-30 NOTE — Telephone Encounter (Signed)
Approved: #60 x 0 Okay to do #100 x 0 if that is what she prefers (we used to do this but not sure lately)

## 2015-08-31 ENCOUNTER — Other Ambulatory Visit: Payer: Self-pay | Admitting: *Deleted

## 2015-08-31 MED ORDER — ALPRAZOLAM 0.5 MG PO TABS: 0.5000 mg | ORAL_TABLET | Freq: Every evening | ORAL | Status: DC | PRN

## 2015-08-31 NOTE — Telephone Encounter (Signed)
rx called into pharmacy

## 2015-08-31 NOTE — Telephone Encounter (Signed)
06/30/15 

## 2015-08-31 NOTE — Telephone Encounter (Signed)
Approved: #60 x 0 Make sure she doesn't still usually get #100 at a time

## 2015-09-16 DIAGNOSIS — M25562 Pain in left knee: Secondary | ICD-10-CM | POA: Diagnosis not present

## 2015-09-17 DIAGNOSIS — M25562 Pain in left knee: Secondary | ICD-10-CM | POA: Diagnosis not present

## 2015-09-18 DIAGNOSIS — M25562 Pain in left knee: Secondary | ICD-10-CM | POA: Diagnosis not present

## 2015-09-21 DIAGNOSIS — M25562 Pain in left knee: Secondary | ICD-10-CM | POA: Diagnosis not present

## 2015-10-29 ENCOUNTER — Other Ambulatory Visit: Payer: Self-pay

## 2015-10-29 MED ORDER — ALPRAZOLAM 0.5 MG PO TABS: 0.5000 mg | ORAL_TABLET | Freq: Every evening | ORAL | Status: DC | PRN

## 2015-10-29 NOTE — Telephone Encounter (Signed)
Approved: #60 x 0 

## 2015-10-29 NOTE — Telephone Encounter (Signed)
Left refill on voice mail at pharmacy  

## 2015-10-29 NOTE — Telephone Encounter (Signed)
Last filled 08-31-15 Last OV 05-18-15 Next OV 12-21-15

## 2015-12-21 ENCOUNTER — Ambulatory Visit (INDEPENDENT_AMBULATORY_CARE_PROVIDER_SITE_OTHER): Payer: Medicare Other | Admitting: Internal Medicine

## 2015-12-21 ENCOUNTER — Encounter: Payer: Self-pay | Admitting: Internal Medicine

## 2015-12-21 VITALS — BP 124/70 | HR 88 | Temp 97.6°F | Ht 63.5 in | Wt 169.0 lb

## 2015-12-21 DIAGNOSIS — K219 Gastro-esophageal reflux disease without esophagitis: Secondary | ICD-10-CM

## 2015-12-21 DIAGNOSIS — Z0000 Encounter for general adult medical examination without abnormal findings: Secondary | ICD-10-CM | POA: Diagnosis not present

## 2015-12-21 DIAGNOSIS — M150 Primary generalized (osteo)arthritis: Secondary | ICD-10-CM

## 2015-12-21 DIAGNOSIS — Z7189 Other specified counseling: Secondary | ICD-10-CM

## 2015-12-21 DIAGNOSIS — M810 Age-related osteoporosis without current pathological fracture: Secondary | ICD-10-CM | POA: Diagnosis not present

## 2015-12-21 DIAGNOSIS — F39 Unspecified mood [affective] disorder: Secondary | ICD-10-CM

## 2015-12-21 DIAGNOSIS — I951 Orthostatic hypotension: Secondary | ICD-10-CM

## 2015-12-21 DIAGNOSIS — M159 Polyosteoarthritis, unspecified: Secondary | ICD-10-CM

## 2015-12-21 NOTE — Assessment & Plan Note (Signed)
Mild anxiety but mostly uses the alprazolam at night (and she will try to cut this down)

## 2015-12-21 NOTE — Assessment & Plan Note (Signed)
Mild chronic issues Handicapped permit form done

## 2015-12-21 NOTE — Assessment & Plan Note (Signed)
Better No sig symptoms since last visit--though still limits fluids (mostly in afternoon)

## 2015-12-21 NOTE — Assessment & Plan Note (Signed)
Has DNR 

## 2015-12-21 NOTE — Progress Notes (Signed)
Subjective:    Patient ID: Bridget Mendez, female    DOB: Oct 17, 1919, 82 y.o.   MRN: [id number]  HPI Here for Medicare wellness and follow up of chronic medical conditions Reviewed form and advanced directives Reviewed other doctors Still enjoys 1-2 whiskeys a day No tobacco Tries to exercise regularly Vision is okay--mild changes but is going for her yearly check up soon Has hearing aides-- they are okay 1 fall this year--while at nail place.  No injury No depression or anhedonia. Some sadness-- lost SIL Independent with instrumental ADLs Mild memory issues--- nothing major though.she keeps notes  No more dizziness since last visit Is being more attentive to regular eating Still careful with fluids  Still with arthritis pain Hands and thumbs, left knee---limits her walker Uses rollator in house (uses it mostly to carry things), cane outside of house mostly  Mood has been okay Generally only uses the alprazolam at night Nerves not enough of a problem otherwise She is considering trying only 1/2 alprazolam  Still gets heartburn at times Uses a tum mostly nights at bedtime No dysphagia  Does regular weight bearing exercise On vitamin D--discussed stopping the extra No calcium now --the pills were too big  Current Outpatient Prescriptions on File Prior to Visit  Medication Sig Dispense Refill  . ALPRAZolam (XANAX) 0.5 MG tablet Take 1-2 tablets (0.5-1 mg total) by mouth at bedtime as needed. 60 tablet 0  . aspirin 81 MG tablet Take 81 mg by mouth daily.    . Cholecalciferol (VITAMIN D) 1000 UNITS capsule Take 1,000 Units by mouth daily.      Marland Kitchen glucosamine-chondroitin 500-400 MG tablet Take 1 tablet by mouth daily.      . Multiple Vitamin (MULTIVITAMIN) tablet Take 2 tablets by mouth daily.     . Probiotic Product (PROBIOTIC DAILY) CAPS Take 1 capsule by mouth daily.      No current facility-administered medications on file prior to visit.    No Known  Allergies  Past Medical History  Diagnosis Date  . GERD (gastroesophageal reflux disease)   . Osteoarthritis   . Osteoporosis   . Depression   . Allergy   . Anxiety     Past Surgical History  Procedure Laterality Date  . Cesarean section      x2  . Hemorrhoid surgery    . Knee arthroscopy  1998 & 2005    both knees  . Inguinal hernia repair  02/2009    Dr.Wilton Tamala Julian  . Total hip arthroplasty Right 07/29/2014    Procedure: RIGHT TOTAL HIP ARTHROPLASTY ANTERIOR APPROACH;  Surgeon: Hessie Dibble, MD;  Location: [facility];  Service: Orthopedics;  Laterality: Right;    Family History  Problem Relation Age of Onset  . Parkinsonism Mother   . Cancer Maternal Grandmother     Social History   Social History  . Marital Status: Married    Spouse Name: N/A  . Number of Children: 2  . Years of Education: N/A   Occupational History  . retired    Social History Main Topics  . Smoking status: Former Smoker -- 1.00 packs/day    Types: Cigarettes    Quit date: 07/11/1988  . Smokeless tobacco: Never Used  . Alcohol Use: Yes     Comment: 2 drinks in the evening  . Drug Use: No  . Sexual Activity: Not on file   Other Topics Concern  . Not on file   Social History Narrative   Has  living will   Daughter, [name], is health care POA   Has DNR already--reviewed   No tube feeds if cognitively aware         Review of Systems Appetite is good Weight is stable Sleeps fairly well---uses medicine Nocturia x 1-2 still  Wears seat belt Teeth are fine--keeps up with dentist (Dr Quentin Mulling associate) No chest pain No SOB No palpitations Bowels are fine with citrucel. No bleeding No dysuria or hematuria No  suspicious skin lesions. Slight rash on hips (?ringworm). Doesn't bother her    Objective:   Physical Exam  Constitutional: She is oriented to person, place, and time. She appears well-developed and well-nourished. No distress.  HENT:  Mouth/Throat: Oropharynx is clear and  moist. No oropharyngeal exudate.  Neck: Normal range of motion. Neck supple. No thyromegaly present.  Cardiovascular: Normal rate, regular rhythm, normal heart sounds and intact distal pulses.  Exam reveals no gallop.   No murmur heard. Pulmonary/Chest: Effort normal and breath sounds normal. No respiratory distress. She has no wheezes. She has no rales.  Abdominal: Soft. There is no tenderness.  Musculoskeletal: She exhibits no edema or tenderness.  Lymphadenopathy:    She has no cervical adenopathy.  Neurological: She is alert and oriented to person, place, and time.  President-- "Daisy Floro, Obama, Bush, Clinton, Bush" [id number] Recall 3/3  Skin: No rash noted. No erythema.  Psychiatric: She has a normal mood and affect. Her behavior is normal.          Assessment & Plan:

## 2015-12-21 NOTE — Progress Notes (Signed)
Pre visit review using our clinic review tool, if applicable. No additional management support is needed unless otherwise documented below in the visit note. 

## 2015-12-21 NOTE — Assessment & Plan Note (Signed)
Regular weight bearing exercise Vit D and calcium

## 2015-12-21 NOTE — Assessment & Plan Note (Signed)
I have personally reviewed the Medicare Annual Wellness questionnaire and have noted 1. The patient's medical and social history 2. Their use of alcohol, tobacco or illicit drugs 3. Their current medications and supplements 4. The patient's functional ability including ADL's, fall risks, home safety risks and hearing or visual             impairment. 5. Diet and physical activities 6. Evidence for depression or mood disorders  The patients weight, height, BMI and visual acuity have been recorded in the chart I have made referrals, counseling and provided education to the patient based review of the above and I have provided the pt with a written personalized care plan for preventive services.  I have provided you with a copy of your personalized plan for preventive services. Please take the time to review along with your updated medication list.  Just needs yearly flu vaccine Stays active and socially involved No cancer screening due to age

## 2015-12-21 NOTE — Assessment & Plan Note (Signed)
Uses tums in evening No signs of worsening status so will not start antacid

## 2015-12-22 ENCOUNTER — Telehealth: Payer: Self-pay | Admitting: Internal Medicine

## 2015-12-22 NOTE — Telephone Encounter (Signed)
New form given to Dr Silvio Pate to sign

## 2015-12-22 NOTE — Telephone Encounter (Signed)
Patient came in yesterday and received a disability placard application.  Patient said the form wasn't signed.  Please mail her a new form with the application signed by Dr.Letvak.

## 2015-12-22 NOTE — Telephone Encounter (Signed)
I'm pretty sure I signed it--but okay to prepare another one for me to sign and then we can mail it

## 2015-12-22 NOTE — Telephone Encounter (Signed)
I am pretty sure it was signed, also. I have mailed her the new one.

## 2016-01-04 ENCOUNTER — Other Ambulatory Visit: Payer: Self-pay | Admitting: *Deleted

## 2016-01-04 MED ORDER — ALPRAZOLAM 0.5 MG PO TABS: 0.5000 mg | ORAL_TABLET | Freq: Every evening | ORAL | Status: DC | PRN

## 2016-01-04 NOTE — Telephone Encounter (Signed)
Fax refill request, last filled on 10/29/15 #60 with 0 refills

## 2016-01-04 NOTE — Telephone Encounter (Signed)
Approved: #60 x 0 

## 2016-02-20 ENCOUNTER — Other Ambulatory Visit: Payer: Self-pay | Admitting: Internal Medicine

## 2016-02-22 NOTE — Telephone Encounter (Signed)
Approved: #60 x 0 

## 2016-02-22 NOTE — Telephone Encounter (Signed)
Left refill on voice mail at pharmacy  

## 2016-02-22 NOTE — Telephone Encounter (Signed)
Last filled 01-04-16 #60 Last OV 12-21-15 Next OV 12-26-16

## 2016-02-24 DIAGNOSIS — H1859 Other hereditary corneal dystrophies: Secondary | ICD-10-CM | POA: Diagnosis not present

## 2016-04-22 ENCOUNTER — Other Ambulatory Visit: Payer: Self-pay | Admitting: Internal Medicine

## 2016-04-22 NOTE — Telephone Encounter (Signed)
Approved: #60 x 0 

## 2016-04-22 NOTE — Telephone Encounter (Signed)
Last filled 02-22-16 #60 Last OV 12-21-15 Next OV 12-26-16

## 2016-04-22 NOTE — Telephone Encounter (Signed)
Left refill on voice mail at pharmacy  

## 2016-04-25 ENCOUNTER — Ambulatory Visit (INDEPENDENT_AMBULATORY_CARE_PROVIDER_SITE_OTHER): Payer: Medicare Other | Admitting: Primary Care

## 2016-04-25 ENCOUNTER — Encounter: Payer: Self-pay | Admitting: Primary Care

## 2016-04-25 DIAGNOSIS — M150 Primary generalized (osteo)arthritis: Secondary | ICD-10-CM

## 2016-04-25 DIAGNOSIS — M159 Polyosteoarthritis, unspecified: Secondary | ICD-10-CM

## 2016-04-25 NOTE — Progress Notes (Signed)
Pre visit review using our clinic review tool, if applicable. No additional management support is needed unless otherwise documented below in the visit note. 

## 2016-04-25 NOTE — Progress Notes (Signed)
Subjective:    Patient ID: Bridget Mendez Mendez, female    DOB: [birth year], 82 y.o.   MRN: [id number]  HPI  Bridget Mendez Mendez is a 82 year old female with a history of osteoarthritis who presents today with a chief complaint of arthritic pain. Her pain is located to her bilateral hands. Her pain is chronic with intermittent flares. She is requesting advise on what to take for her chronic pain. She was previously managed on hydrocodone, tylenol, naproxen, ibuprofen. She never noticed improvement while taking hydrocodone, but has noticed improvement on tylenol and naproxen. She's not taken anything consistently for pain.   Review of Systems  Constitutional: Negative for fever.  Musculoskeletal: Positive for arthralgias. Negative for joint swelling.  Skin: Negative for color change.       Past Medical History:  Diagnosis Date  . Allergy   . Anxiety   . Depression   . GERD (gastroesophageal reflux disease)   . Osteoarthritis   . Osteoporosis      Social History   Social History  . Marital status: Married    Spouse name: N/A  . Number of children: 2  . Years of education: N/A   Occupational History  . retired    Social History Main Topics  . Smoking status: Former Smoker    Packs/day: 1.00    Types: Cigarettes    Quit date: 07/11/1988  . Smokeless tobacco: Never Used  . Alcohol use Yes     Comment: 2 drinks in the evening  . Drug use: No  . Sexual activity: Not on file   Other Topics Concern  . Not on file   Social History Narrative   Has living will   Daughter, Bridget Mendez Mendez, is health care POA   Has DNR already--reviewed   No tube feeds if cognitively aware          Past Surgical History:  Procedure Laterality Date  . CESAREAN SECTION     x2  . HEMORRHOID SURGERY    . INGUINAL HERNIA REPAIR  02/2009   Dr.Wilton Tamala Julian  . KNEE ARTHROSCOPY  1998 & 2005   both knees  . TOTAL HIP ARTHROPLASTY Right 07/29/2014   Procedure: RIGHT TOTAL HIP ARTHROPLASTY ANTERIOR APPROACH;   Surgeon: Hessie Dibble, MD;  Location: [facility];  Service: Orthopedics;  Laterality: Right;    Family History  Problem Relation Age of Onset  . Parkinsonism Mother   . Cancer Maternal Grandmother     No Known Allergies  Current Outpatient Prescriptions on File Prior to Visit  Medication Sig Dispense Refill  . ALPRAZolam (XANAX) 0.5 MG tablet take 1 to 2 tablets at bedtime if needed 60 tablet 0  . aspirin 81 MG tablet Take 81 mg by mouth daily.    . calcium carbonate (TUMS EX) 750 MG chewable tablet Chew 1-2 tablets by mouth daily.    . Cholecalciferol (VITAMIN D) 1000 UNITS capsule Take 1,000 Units by mouth daily.      Marland Kitchen glucosamine-chondroitin 500-400 MG tablet Take 1 tablet by mouth daily.      . Multiple Vitamin (MULTIVITAMIN) tablet Take 2 tablets by mouth daily.     . Probiotic Product (PROBIOTIC DAILY) CAPS Take 1 capsule by mouth daily.      No current facility-administered medications on file prior to visit.     BP 124/76   Pulse 100   Temp 97.5 F (36.4 C) (Oral)   Ht 5' 3.5" (1.613 m)   Wt 169 lb (76.7  kg)   SpO2 96%   BMI 29.47 kg/m    Objective:   Physical Exam  Constitutional: She appears well-nourished.  Cardiovascular: Normal rate and regular rhythm.   Pulmonary/Chest: Effort normal and breath sounds normal.  Musculoskeletal:  Mild deformity DIP and PIP joints of bilateral hands. Crepitus to left DIP joint of left first digit. No swelling or erythema.  Skin: Skin is warm and dry.          Assessment & Plan:

## 2016-04-25 NOTE — Assessment & Plan Note (Signed)
Chronic to bilateral hands with intermittent flares. Long discussion today regarding treatment options. We'll have her trial Tylenol arthritis 650 mg twice a day. LFTs in 2016 stable. She is to report back if no improvement within 1-2 weeks.

## 2016-04-25 NOTE — Patient Instructions (Signed)
Start with Tylenol Arthritis 650 mg tablets. Take 1 tablet by mouth twice daily.   If no improvement in pain then please call Dr. Silvio Pate or myself.  It was a pleasure meeting you!  Osteoarthritis Osteoarthritis is a disease that causes soreness and inflammation of a joint. It occurs when the cartilage at the affected joint wears down. Cartilage acts as a cushion, covering the ends of bones where they meet to form a joint. Osteoarthritis is the most common form of arthritis. It often occurs in older people. The joints affected most often by this condition include those in the:  Ends of the fingers.  Thumbs.  Neck.  Lower back.  Knees.  Hips. CAUSES  Over time, the cartilage that covers the ends of bones begins to wear away. This causes bone to rub on bone, producing pain and stiffness in the affected joints.  RISK FACTORS Certain factors can increase your chances of having osteoarthritis, including:  Older age.  Excessive body weight.  Overuse of joints.  Previous joint injury. SIGNS AND SYMPTOMS   Pain, swelling, and stiffness in the joint.  Over time, the joint may lose its normal shape.  Small deposits of bone (osteophytes) may grow on the edges of the joint.  Bits of bone or cartilage can break off and float inside the joint space. This may cause more pain and damage. DIAGNOSIS  Your health care provider will do a physical exam and ask about your symptoms. Various tests may be ordered, such as:  X-rays of the affected joint.  Blood tests to rule out other types of arthritis. Additional tests may be used to diagnose your condition. TREATMENT  Goals of treatment are to control pain and improve joint function. Treatment plans may include:  A prescribed exercise program that allows for rest and joint relief.  A weight control plan.  Pain relief techniques, such as:  Properly applied heat and cold.  Electric pulses delivered to nerve endings under the skin  (transcutaneous electrical nerve stimulation [TENS]).  Massage.  Certain nutritional supplements.  Medicines to control pain, such as:  Acetaminophen.  Nonsteroidal anti-inflammatory drugs (NSAIDs), such as naproxen.  Narcotic or central-acting agents, such as tramadol.  Corticosteroids. These can be given orally or as an injection.  Surgery to reposition the bones and relieve pain (osteotomy) or to remove loose pieces of bone and cartilage. Joint replacement may be needed in advanced states of osteoarthritis. HOME CARE INSTRUCTIONS   Take medicines only as directed by your health care provider.  Maintain a healthy weight. Follow your health care provider's instructions for weight control. This may include dietary instructions.  Exercise as directed. Your health care provider can recommend specific types of exercise. These may include:  Strengthening exercises. These are done to strengthen the muscles that support joints affected by arthritis. They can be performed with weights or with exercise bands to add resistance.  Aerobic activities. These are exercises, such as brisk walking or low-impact aerobics, that get your heart pumping.  Range-of-motion activities. These keep your joints limber.  Balance and agility exercises. These help you maintain daily living skills.  Rest your affected joints as directed by your health care provider.  Keep all follow-up visits as directed by your health care provider. SEEK MEDICAL CARE IF:   Your skin turns red.  You develop a rash in addition to your joint pain.  You have worsening joint pain.  You have a fever along with joint or muscle aches. [place]  CARE IF:  You have a significant loss of weight or appetite.  You have night sweats. [place] of Arthritis and Musculoskeletal and Skin Diseases: www.niams.SouthExposed.es  [employer] on Aging: http://kim-miller.com/  American College  of Rheumatology: www.rheumatology.org   This information is not intended to replace advice given to you by your health care provider. Make sure you discuss any questions you have with your health care provider.   Document Released: 06/27/2005 Document Revised: 07/18/2014 Document Reviewed: 03/04/2013 Elsevier Interactive Patient Education [employer].

## 2016-05-05 ENCOUNTER — Telehealth: Payer: Self-pay

## 2016-05-05 DIAGNOSIS — Z23 Encounter for immunization: Secondary | ICD-10-CM | POA: Diagnosis not present

## 2016-05-05 NOTE — Telephone Encounter (Signed)
Pt wants to know if she needs to get the new shingles vaccine pt has seen advertised. Pt said this is a new shingles vaccine and not the zostavax pt has already taken. Pt request cb.

## 2016-05-06 NOTE — Telephone Encounter (Signed)
Spoke to pt. She agreed she should wait to see what they were really talking about.

## 2016-05-06 NOTE — Telephone Encounter (Signed)
I have not heard of any additional shingles vaccine. I would advise her to hold off at this time.

## 2016-05-12 DIAGNOSIS — D229 Melanocytic nevi, unspecified: Secondary | ICD-10-CM | POA: Diagnosis not present

## 2016-05-12 DIAGNOSIS — L812 Freckles: Secondary | ICD-10-CM | POA: Diagnosis not present

## 2016-05-12 DIAGNOSIS — L820 Inflamed seborrheic keratosis: Secondary | ICD-10-CM | POA: Diagnosis not present

## 2016-05-12 DIAGNOSIS — Z1283 Encounter for screening for malignant neoplasm of skin: Secondary | ICD-10-CM | POA: Diagnosis not present

## 2016-05-12 DIAGNOSIS — D485 Neoplasm of uncertain behavior of skin: Secondary | ICD-10-CM | POA: Diagnosis not present

## 2016-05-12 DIAGNOSIS — L570 Actinic keratosis: Secondary | ICD-10-CM | POA: Diagnosis not present

## 2016-05-12 DIAGNOSIS — Z85820 Personal history of malignant melanoma of skin: Secondary | ICD-10-CM | POA: Diagnosis not present

## 2016-05-12 DIAGNOSIS — D1800 Hemangioma unspecified site: Secondary | ICD-10-CM | POA: Diagnosis not present

## 2016-05-12 DIAGNOSIS — L821 Other seborrheic keratosis: Secondary | ICD-10-CM | POA: Diagnosis not present

## 2016-05-12 DIAGNOSIS — D692 Other nonthrombocytopenic purpura: Secondary | ICD-10-CM | POA: Diagnosis not present

## 2016-05-12 DIAGNOSIS — L578 Other skin changes due to chronic exposure to nonionizing radiation: Secondary | ICD-10-CM | POA: Diagnosis not present

## 2016-06-23 ENCOUNTER — Other Ambulatory Visit: Payer: Self-pay | Admitting: Internal Medicine

## 2016-06-23 NOTE — Telephone Encounter (Signed)
Last filled 04-22-16 #60 Last OV 04-25-16 Next OV 12-26-16.  Forwarding to [employer] in Dr Alla German absence

## 2016-06-23 NOTE — Telephone Encounter (Signed)
May call in to patient's pharmacy

## 2016-06-23 NOTE — Telephone Encounter (Signed)
Left refill on voice mail at pharmacy  

## 2016-08-15 ENCOUNTER — Other Ambulatory Visit: Payer: Self-pay

## 2016-08-15 MED ORDER — ALPRAZOLAM 0.5 MG PO TABS: ORAL_TABLET | ORAL | 0 refills | Status: DC

## 2016-08-15 NOTE — Telephone Encounter (Signed)
Last filled 06-23-16 #60 Last OV Acute 04-25-16 Next OV 12-26-16

## 2016-08-15 NOTE — Telephone Encounter (Signed)
Approved: #60 x 0 

## 2016-08-15 NOTE — Telephone Encounter (Signed)
Left refill on voice mail at pharmacy  

## 2016-08-23 DIAGNOSIS — H40003 Preglaucoma, unspecified, bilateral: Secondary | ICD-10-CM | POA: Diagnosis not present

## 2016-08-23 MED ORDER — ALPRAZOLAM 0.5 MG PO TABS: ORAL_TABLET | ORAL | 0 refills | Status: DC

## 2016-08-23 NOTE — Addendum Note (Signed)
Addended by: Helene Shoe on: 08/23/2016 10:43 AM   Modules accepted: Orders

## 2016-08-23 NOTE — Telephone Encounter (Signed)
Pt left v/m [facility] does not have refill for alprazolam; I spoke with Larkin Ina at [facility] and he did not have alprazolam call in. Medication phoned to [facility] at [facility] as instructed. Per DPR I left detailed v/m for pt to ck with [facility] after 12 noon today.

## 2016-10-05 ENCOUNTER — Encounter: Payer: Self-pay | Admitting: Internal Medicine

## 2016-10-05 ENCOUNTER — Encounter (INDEPENDENT_AMBULATORY_CARE_PROVIDER_SITE_OTHER): Payer: Self-pay

## 2016-10-05 ENCOUNTER — Ambulatory Visit (INDEPENDENT_AMBULATORY_CARE_PROVIDER_SITE_OTHER): Payer: Medicare Other | Admitting: Internal Medicine

## 2016-10-05 VITALS — BP 120/82 | HR 95 | Temp 97.7°F | Wt 169.0 lb

## 2016-10-05 DIAGNOSIS — F39 Unspecified mood [affective] disorder: Secondary | ICD-10-CM | POA: Diagnosis not present

## 2016-10-05 MED ORDER — ALPRAZOLAM 0.5 MG PO TABS: 0.2500 mg | ORAL_TABLET | Freq: Two times a day (BID) | ORAL | 0 refills | Status: DC | PRN

## 2016-10-05 NOTE — Progress Notes (Signed)
Subjective:    Patient ID: Bridget Mendez, female    DOB: [birth year], 83 y.o.   MRN: [id number]  HPI Here due to problems with anxiety Concerned she needs more medication  Had an upset a few weeks ago--thought she lost her teeth in grocery store [place] so worked up Concerned about friend who is in health care  Remains "high strung" Pressured speech a lot Trouble concentrating--mind always on the move  Using the alprazolam at night Has not used the alprazolam in daytime before but due to changes, has tried 0.25mg  in AM as well Not clear that it made any difference  No depression or anhedonia Goes out a lot---plenty of social interaction  Current Outpatient Prescriptions on File Prior to Visit  Medication Sig Dispense Refill  . ALPRAZolam (XANAX) 0.5 MG tablet TAKE 1 TO 2 TABLETS BY MOUTH EVERY NIGHT AT BEDTIME AS NEEDED 60 tablet 0  . aspirin 81 MG tablet Take 81 mg by mouth daily.    . calcium carbonate (TUMS EX) 750 MG chewable tablet Chew 1-2 tablets by mouth daily.    . Cholecalciferol (VITAMIN D) 1000 UNITS capsule Take 1,000 Units by mouth daily.      Marland Kitchen glucosamine-chondroitin 500-400 MG tablet Take 1 tablet by mouth daily.      . Multiple Vitamin (MULTIVITAMIN) tablet Take 2 tablets by mouth daily.     . Probiotic Product (PROBIOTIC DAILY) CAPS Take 1 capsule by mouth daily.      No current facility-administered medications on file prior to visit.     No Known Allergies  Past Medical History:  Diagnosis Date  . Allergy   . Anxiety   . Depression   . GERD (gastroesophageal reflux disease)   . Osteoarthritis   . Osteoporosis     Past Surgical History:  Procedure Laterality Date  . CESAREAN SECTION     x2  . HEMORRHOID SURGERY    . INGUINAL HERNIA REPAIR  02/2009   Dr.Wilton Tamala Julian  . KNEE ARTHROSCOPY  1998 & 2005   both knees  . TOTAL HIP ARTHROPLASTY Right 07/29/2014   Procedure: RIGHT TOTAL HIP ARTHROPLASTY ANTERIOR APPROACH;  Surgeon: Hessie Dibble,  MD;  Location: [facility];  Service: Orthopedics;  Laterality: Right;    Family History  Problem Relation Age of Onset  . Parkinsonism Mother   . Cancer Maternal Grandmother     Social History   Social History  . Marital status: Married    Spouse name: N/A  . Number of children: 2  . Years of education: N/A   Occupational History  . retired    Social History Main Topics  . Smoking status: Former Smoker    Packs/day: 1.00    Types: Cigarettes    Quit date: 07/11/1988  . Smokeless tobacco: Never Used  . Alcohol use Yes     Comment: 2 drinks in the evening  . Drug use: No  . Sexual activity: Not on file   Other Topics Concern  . Not on file   Social History Narrative   Has living will   Daughter, [name], is health care POA   Has DNR already--reviewed   No tube feeds if cognitively aware         Review of Systems Wonders about getting the shingrix Does try to exercise regularly Eating well--still cooks for herself Sleeps well Still enjoys 2 drinks daily Uses cane outside---still with knee pain from arthritis. rollator in house (gives more support to knee)  Drives okay    Objective:   Physical Exam  Psychiatric:  Mild anxiety and slightly pressured speech Not depressed          Assessment & Plan:

## 2016-10-05 NOTE — Patient Instructions (Signed)
You can continue the 1 alprazolam at bedtime. Add a half of alprazolam during the day if you are having increased anxiety symptoms.

## 2016-10-05 NOTE — Progress Notes (Signed)
Pre visit review using our clinic review tool, if applicable. No additional management support is needed unless otherwise documented below in the visit note. 

## 2016-10-05 NOTE — Assessment & Plan Note (Signed)
Chronic anxiety with some new stressors. Doesn't seem to need new medication like SSRI Discussed using 1/2 alprazolam in day prn if stressed

## 2016-10-06 ENCOUNTER — Other Ambulatory Visit: Payer: Self-pay | Admitting: Internal Medicine

## 2016-10-06 NOTE — Telephone Encounter (Signed)
Per Dr Silvio Pate, approve #[id number]  Left refill on voice mail at pharmacy

## 2016-12-26 ENCOUNTER — Ambulatory Visit (INDEPENDENT_AMBULATORY_CARE_PROVIDER_SITE_OTHER): Payer: Medicare Other | Admitting: Internal Medicine

## 2016-12-26 ENCOUNTER — Encounter: Payer: Self-pay | Admitting: Internal Medicine

## 2016-12-26 VITALS — BP 126/68 | HR 95 | Temp 98.5°F | Ht 63.25 in | Wt 166.2 lb

## 2016-12-26 DIAGNOSIS — K219 Gastro-esophageal reflux disease without esophagitis: Secondary | ICD-10-CM | POA: Diagnosis not present

## 2016-12-26 DIAGNOSIS — Z7189 Other specified counseling: Secondary | ICD-10-CM

## 2016-12-26 DIAGNOSIS — M150 Primary generalized (osteo)arthritis: Secondary | ICD-10-CM | POA: Diagnosis not present

## 2016-12-26 DIAGNOSIS — F39 Unspecified mood [affective] disorder: Secondary | ICD-10-CM | POA: Diagnosis not present

## 2016-12-26 DIAGNOSIS — M159 Polyosteoarthritis, unspecified: Secondary | ICD-10-CM

## 2016-12-26 DIAGNOSIS — Z23 Encounter for immunization: Secondary | ICD-10-CM

## 2016-12-26 DIAGNOSIS — Z0000 Encounter for general adult medical examination without abnormal findings: Secondary | ICD-10-CM

## 2016-12-26 LAB — COMPREHENSIVE METABOLIC PANEL
ALT: 10 U/L (ref 0–35)
AST: 16 U/L (ref 0–37)
Albumin: 4.5 g/dL (ref 3.5–5.2)
Alkaline Phosphatase: 62 U/L (ref 39–117)
BILIRUBIN TOTAL: 0.6 mg/dL (ref 0.2–1.2)
BUN: 29 mg/dL — AB (ref 6–23)
CO2: 27 mEq/L (ref 19–32)
Calcium: 9.8 mg/dL (ref 8.4–10.5)
Chloride: 103 mEq/L (ref 96–112)
Creatinine, Ser: 1.02 mg/dL (ref 0.40–1.20)
GFR: 53.53 mL/min — AB (ref >60.00)
Glucose, Bld: 95 mg/dL (ref 70–99)
Potassium: 5 mEq/L (ref 3.5–5.1)
Sodium: 137 mEq/L (ref 135–145)
Total Protein: 7.2 g/dL (ref 6.0–8.3)

## 2016-12-26 LAB — CBC WITH DIFFERENTIAL/PLATELET
BASOS ABS: 0 10*3/uL (ref 0.0–0.1)
Basophils Relative: 0.3 % (ref 0.0–3.0)
Eosinophils Absolute: 0.1 10*3/uL (ref 0.0–0.7)
Eosinophils Relative: 1.1 % (ref 0.0–5.0)
HCT: 41 % (ref 36.0–46.0)
Hemoglobin: 13.4 g/dL (ref 12.0–15.0)
LYMPHS ABS: 2.1 10*3/uL (ref 0.7–4.0)
Lymphocytes Relative: 30.8 % (ref 12.0–46.0)
MCHC: 32.7 g/dL (ref 30.0–36.0)
MCV: 100.5 fl — ABNORMAL HIGH (ref 78.0–100.0)
MONO ABS: 1 10*3/uL (ref 0.1–1.0)
Monocytes Relative: 14.4 % — ABNORMAL HIGH (ref 3.0–12.0)
NEUTROS PCT: 53.4 % (ref 43.0–77.0)
Neutro Abs: 3.6 10*3/uL (ref 1.4–7.7)
Platelets: 377 10*3/uL (ref 150.0–400.0)
RBC: 4.08 Mil/uL (ref 3.87–5.11)
RDW: 14.2 % (ref 11.5–15.5)
WBC: 6.8 10*3/uL (ref 4.0–10.5)

## 2016-12-26 NOTE — Assessment & Plan Note (Signed)
Has DNR 

## 2016-12-26 NOTE — Assessment & Plan Note (Signed)
And some irritable stomach --as in past Uses tums as needed

## 2016-12-26 NOTE — Assessment & Plan Note (Signed)
Knees are the worst--does okay with tylenol

## 2016-12-26 NOTE — Assessment & Plan Note (Signed)
I have personally reviewed the Medicare Annual Wellness questionnaire and have noted 1. The patient's medical and social history 2. Their use of alcohol, tobacco or illicit drugs 3. Their current medications and supplements 4. The patient's functional ability including ADL's, fall risks, home safety risks and hearing or visual             impairment. 5. Diet and physical activities 6. Evidence for depression or mood disorders  The patients weight, height, BMI and visual acuity have been recorded in the chart I have made referrals, counseling and provided education to the patient based review of the above and I have provided the pt with a written personalized care plan for preventive services.  I have provided you with a copy of your personalized plan for preventive services. Please take the time to review along with your updated medication list.  Healthy Discussed fitness Update pneumovax Yearly flu vaccine No cancer screening due to age

## 2016-12-26 NOTE — Addendum Note (Signed)
Addended by: Modena Nunnery on: 12/26/2016 11:17 AM   Modules accepted: Orders

## 2016-12-26 NOTE — Assessment & Plan Note (Signed)
Mostly episodic anxiety Does okay with alprazolam--just at night bascially

## 2016-12-26 NOTE — Progress Notes (Signed)
Subjective:    Patient ID: Bridget Mendez, female    DOB: [birth year], 83 y.o.   MRN: [id number]  HPI Here for Medicare wellness visit and follow up of chronic health conditions Reviewed form and advanced directives Reviewed other doctors Still enjoys whiskey 1-2 many days (unless tired) No tobacco Tries to do yoga regularly Vision is fading some--considering cataract extraction (Dr Dingledein) Poor hearing-- has aides No falls Independent with instrumental ADLs No significant memory issues  Was "pooped" a couple of weeks ago Husband's model of ship was sent to [place] for formal display--exciting but tiring Son there and frenetic Now getting back to normal  Her anxiety did work up with recent events Hasn't needed the alprazolam in the day--still just taking at night No depression or anhedonia  No change in arthritis Fingers are actually better Takes tylenol arthritis bid  Glucosamine chondroitin as well  Irritable stomach okay On the probiotic Some burping and gas---tums prn No dysphagia  Current Outpatient Prescriptions on File Prior to Visit  Medication Sig Dispense Refill  . ALPRAZolam (XANAX) 0.5 MG tablet 1/2 to 1 tablet by mouth twice a day as needed for anxiety. 60 tablet 0  . aspirin 81 MG tablet Take 81 mg by mouth daily.    . calcium carbonate (TUMS EX) 750 MG chewable tablet Chew 1-2 tablets by mouth daily.    Marland Kitchen glucosamine-chondroitin 500-400 MG tablet Take 1 tablet by mouth daily.      . Multiple Vitamin (MULTIVITAMIN) tablet Take 2 tablets by mouth daily.     . Probiotic Product (PROBIOTIC DAILY) CAPS Take 1 capsule by mouth daily.      No current facility-administered medications on file prior to visit.     No Known Allergies  Past Medical History:  Diagnosis Date  . Allergy   . Anxiety   . Depression   . GERD (gastroesophageal reflux disease)   . Osteoarthritis   . Osteoporosis     Past Surgical History:  Procedure Laterality Date  .  CESAREAN SECTION     x2  . HEMORRHOID SURGERY    . INGUINAL HERNIA REPAIR  02/2009   Dr.Wilton Tamala Julian  . KNEE ARTHROSCOPY  1998 & 2005   both knees  . TOTAL HIP ARTHROPLASTY Right 07/29/2014   Procedure: RIGHT TOTAL HIP ARTHROPLASTY ANTERIOR APPROACH;  Surgeon: Hessie Dibble, MD;  Location: [facility];  Service: Orthopedics;  Laterality: Right;    Family History  Problem Relation Age of Onset  . Parkinsonism Mother   . Cancer Maternal Grandmother     Social History   Social History  . Marital status: Married    Spouse name: N/A  . Number of children: 2  . Years of education: N/A   Occupational History  . retired    Social History Main Topics  . Smoking status: Former Smoker    Packs/day: 1.00    Types: Cigarettes    Quit date: 07/11/1988  . Smokeless tobacco: Never Used  . Alcohol use Yes     Comment: 2 drinks in the evening  . Drug use: No  . Sexual activity: Not on file   Other Topics Concern  . Not on file   Social History Narrative   Has living will   Daughter, Bridget Mendez, is health care POA--- son Bridget Mendez is alternate   Has DNR already--reviewed   No tube feeds if cognitively aware          Review of Systems Usually sleeps okay with  alprazolam---up at [facility] or so (after 7 hours) Appetite is fine Weight is stable Wears seat Teeth are okay-- sees Dr Sandria Bales (sp.) No chest pain Feels her breathing is not great---needs to take deep breath (no change) No recent orthostatic dizziness Bowels are okay with citrucel. Voids well No rash or suspicious skin lesions    Objective:   Physical Exam  Constitutional: She is oriented to person, place, and time. She appears well-developed. No distress.  HENT:  Mouth/Throat: Oropharynx is clear and moist. No oropharyngeal exudate.  Neck: No thyromegaly present.  Cardiovascular: Normal rate, regular rhythm and intact distal pulses.  Exam reveals no gallop.   Gr 2/6 aortic systolic murmur  Pulmonary/Chest: Effort normal and  breath sounds normal. No respiratory distress. She has no wheezes. She has no rales.  Abdominal: Soft. There is no tenderness.  Musculoskeletal: She exhibits no edema or tenderness.  Lymphadenopathy:    She has no cervical adenopathy.  Neurological: She is alert and oriented to person, place, and time.  President-- "Bridget Mendez, Obama, Bush" [id number] Recall 3/3  Skin: No rash noted. No erythema.  Psychiatric: She has a normal mood and affect. Her behavior is normal.          Assessment & Plan:

## 2016-12-27 ENCOUNTER — Encounter: Payer: Self-pay | Admitting: *Deleted

## 2016-12-29 ENCOUNTER — Other Ambulatory Visit: Payer: Self-pay

## 2016-12-29 MED ORDER — ALPRAZOLAM 0.5 MG PO TABS: ORAL_TABLET | ORAL | 0 refills | Status: DC

## 2016-12-29 NOTE — Telephone Encounter (Signed)
Pt left VM stating that her pharmacy "contacted [place] on Monday" for a refill for her xanax rx. I don't see anything on file from [place] [facility].   Last refill 10/06/16 #60 Last OV 12/26/16  Ok to refill?

## 2016-12-29 NOTE — Telephone Encounter (Signed)
Approved: #60 x 0 

## 2016-12-29 NOTE — Telephone Encounter (Signed)
Left refill on voice mail at pharmacy  

## 2017-02-23 DIAGNOSIS — H2513 Age-related nuclear cataract, bilateral: Secondary | ICD-10-CM | POA: Diagnosis not present

## 2017-02-23 DIAGNOSIS — H40003 Preglaucoma, unspecified, bilateral: Secondary | ICD-10-CM | POA: Diagnosis not present

## 2017-03-03 ENCOUNTER — Other Ambulatory Visit: Payer: Self-pay

## 2017-03-03 MED ORDER — ALPRAZOLAM 0.5 MG PO TABS: ORAL_TABLET | ORAL | 0 refills | Status: DC

## 2017-03-03 NOTE — Telephone Encounter (Signed)
Approved: #60 x 0 

## 2017-03-03 NOTE — Telephone Encounter (Signed)
Left refill on voice mail at pharmacy  

## 2017-03-03 NOTE — Telephone Encounter (Signed)
Last filled 12-29-16 #60 Last OV 12-26-16 Next OV 01-02-18

## 2017-03-16 ENCOUNTER — Ambulatory Visit (INDEPENDENT_AMBULATORY_CARE_PROVIDER_SITE_OTHER): Payer: Medicare Other | Admitting: Primary Care

## 2017-03-16 ENCOUNTER — Encounter: Payer: Self-pay | Admitting: Primary Care

## 2017-03-16 VITALS — BP 138/78 | HR 69 | Temp 97.8°F | Wt 168.0 lb

## 2017-03-16 DIAGNOSIS — M542 Cervicalgia: Secondary | ICD-10-CM

## 2017-03-16 MED ORDER — TIZANIDINE HCL 2 MG PO TABS: 2.0000 mg | ORAL_TABLET | Freq: Three times a day (TID) | ORAL | 0 refills | Status: DC | PRN

## 2017-03-16 NOTE — Patient Instructions (Signed)
Continue stretching exercises as discussed.  You may try tizanidine 2 mg tablets for muscle spasms to the neck. Take 1 tablet by mouth every 8 hours as needed. Caution as this may cause drowsiness.  Please notify me if no improvement in 1 week.  It was a pleasure meeting you!

## 2017-03-16 NOTE — Progress Notes (Signed)
Subjective:    Patient ID: Bridget Bridget Mendez, female    DOB: 03-Jul-1920, 83 y.o.   MRN: [id number]  HPI  Ms. Bridget Mendez is a 83 year old female who presents today with a chief complaint of neck pain. Her pain is located to the right lateral neck with radiation down to her right shoulder. She also reports stiffness with decrease in ROM. She will suddenly feel a pain to the right lateral neck/shoulder that will "catch" and tighten up. She's been icing her neck with some improvement. Her symptoms began about 2 months ago. She's been taking tylenol arthritis without improvement.   Review of Systems  Musculoskeletal: Positive for neck pain and neck stiffness.  Skin: Negative for color change.  Neurological: Negative for numbness.       Past Medical History:  Diagnosis Date  . Allergy   . Anxiety   . Depression   . GERD (gastroesophageal reflux disease)   . Osteoarthritis   . Osteoporosis      Social History   Social History  . Marital status: Married    Spouse name: N/A  . Number of children: 2  . Years of education: N/A   Occupational History  . retired    Social History Main Topics  . Smoking status: Former Smoker    Packs/day: 1.00    Types: Cigarettes    Quit date: 07/11/1988  . Smokeless tobacco: Never Used  . Alcohol use Yes     Comment: 2 drinks in the evening  . Drug use: No  . Sexual activity: Not on file   Other Topics Concern  . Not on file   Social History Narrative   Has living will   Daughter, Bridget Bridget Mendez, is health care POA--- son Bridget Bridget Mendez is alternate   Has DNR already--reviewed   No tube feeds if cognitively aware          Past Surgical History:  Procedure Laterality Date  . CESAREAN SECTION     x2  . HEMORRHOID SURGERY    . INGUINAL HERNIA REPAIR  02/2009   Dr.Wilton Tamala Mendez  . KNEE ARTHROSCOPY  1998 & 2005   both knees  . TOTAL HIP ARTHROPLASTY Right 07/29/2014   Procedure: RIGHT TOTAL HIP ARTHROPLASTY ANTERIOR APPROACH;  Surgeon: Bridget Dibble,  MD;  Location: [facility];  Service: Orthopedics;  Laterality: Right;    Family History  Problem Relation Age of Onset  . Parkinsonism Mother   . Cancer Maternal Grandmother     No Known Allergies  Current Outpatient Prescriptions on File Prior to Visit  Medication Sig Dispense Refill  . acetaminophen (TYLENOL) 650 MG CR tablet Take 650 mg by mouth 2 (two) times daily.    Marland Kitchen ALPRAZolam (XANAX) 0.5 MG tablet 1/2 to 1 tablet by mouth twice a day as needed for anxiety. 60 tablet 0  . aspirin 81 MG tablet Take 81 mg by mouth daily.    . calcium carbonate (TUMS EX) 750 MG chewable tablet Chew 1-2 tablets by mouth daily.    Marland Kitchen glucosamine-chondroitin 500-400 MG tablet Take 1 tablet by mouth daily.      . Multiple Vitamin (MULTIVITAMIN) tablet Take 2 tablets by mouth daily.     . Probiotic Product (PROBIOTIC DAILY) CAPS Take 1 capsule by mouth daily.      No current facility-administered medications on file prior to visit.     BP 138/78 (BP Location: Left Arm, Patient Position: Sitting, Cuff Size: Normal)   Pulse 69  Temp 97.8 F (36.6 C) (Oral)   Wt 168 lb (76.2 kg)   SpO2 95%   BMI 29.53 kg/m    Objective:   Physical Exam  Constitutional: She appears well-nourished.  Neck: Neck supple. Spinous process tenderness present. Decreased range of motion present.    Decrease in range of motion with flexion, right lateral movement, right lateral bending. Muscle tension noted to right lateral neck. No bony tenderness.  Cardiovascular: Normal rate.   Pulmonary/Chest: Effort normal.  Skin: Skin is warm and dry.          Assessment & Plan:  Neck pain:  Located to right lateral neck with radiation to right shoulder 2 months, intermittent. No improvement with Tylenol arthritis. Consider muscle spasm as etiology given examination in history of present illness. Will treat with low-dose muscle relaxer, discussed to start at bedtime, drowsiness precautions provided. Continue Tylenol as  needed. Continue stretching exercises as discussed today. She will call in one week if no improvement, consider physical therapy.  Sheral Flow, NP

## 2017-03-21 ENCOUNTER — Other Ambulatory Visit: Payer: Self-pay | Admitting: Primary Care

## 2017-03-21 DIAGNOSIS — M542 Cervicalgia: Secondary | ICD-10-CM

## 2017-03-21 NOTE — Telephone Encounter (Signed)
Spoken and notified patient of Kate's comments. Patient verbalized understanding. 

## 2017-03-21 NOTE — Telephone Encounter (Signed)
Pt left vm; pt seen 03/16/17 and tizanidine is helping and pt has med to last until 03/26/17. Pt request to get # 60 or # 100 if cheaper.Pt request cb. [place].

## 2017-03-21 NOTE — Telephone Encounter (Signed)
Noted, will refill for #60, please have her request this through PCP if any additional refills are required.

## 2017-04-14 ENCOUNTER — Other Ambulatory Visit: Payer: Self-pay | Admitting: Primary Care

## 2017-04-14 DIAGNOSIS — M542 Cervicalgia: Secondary | ICD-10-CM

## 2017-04-14 NOTE — Telephone Encounter (Signed)
Approved: #60 x 0 Let her know that I think this is best used just at bedtime

## 2017-04-14 NOTE — Telephone Encounter (Signed)
Ok to refill? Last prescribed on 03/21/2017 by Anda Kraft. Last seen on 03/16/2017

## 2017-04-14 NOTE — Telephone Encounter (Signed)
Spoke to pt. She was glad we called her. She was concerned with taking it up to 2 times a day and she dropped to 2 a day. She was asking what she should do about taking Alprazolam at bedtime like she has been. I advised her if she took a tizanidine at bedtime not to take alprazolam.

## 2017-05-04 DIAGNOSIS — Z23 Encounter for immunization: Secondary | ICD-10-CM | POA: Diagnosis not present

## 2017-05-05 ENCOUNTER — Other Ambulatory Visit: Payer: Self-pay | Admitting: Internal Medicine

## 2017-05-05 NOTE — Telephone Encounter (Signed)
Approved: #60 x 0 

## 2017-05-05 NOTE — Telephone Encounter (Signed)
Left refill on voice mail at pharmacy  

## 2017-05-05 NOTE — Telephone Encounter (Signed)
Last filled 03-03-17 #60 Last OV 12-26-16 Next OV 01-02-18

## 2017-05-26 ENCOUNTER — Other Ambulatory Visit: Payer: Self-pay | Admitting: Internal Medicine

## 2017-05-26 DIAGNOSIS — M542 Cervicalgia: Secondary | ICD-10-CM

## 2017-05-26 NOTE — Telephone Encounter (Signed)
Approved: #60 x 0 

## 2017-05-31 ENCOUNTER — Ambulatory Visit: Payer: Self-pay

## 2017-05-31 ENCOUNTER — Other Ambulatory Visit: Payer: Self-pay

## 2017-05-31 ENCOUNTER — Observation Stay
Admit: 2017-05-31 | Discharge: 2017-05-31 | Disposition: A | Discharge status: Home / Self Care | Payer: Medicare Other | Attending: Internal Medicine | Admitting: Internal Medicine

## 2017-05-31 ENCOUNTER — Encounter: Payer: Self-pay | Admitting: Emergency Medicine

## 2017-05-31 ENCOUNTER — Observation Stay
Admission: EM | Admit: 2017-05-31 | Discharge: 2017-06-01 | Disposition: A | Discharge status: Skilled Nursing Facility | Payer: Medicare Other | Attending: Internal Medicine | Admitting: Internal Medicine

## 2017-05-31 DIAGNOSIS — Z79899 Other long term (current) drug therapy: Secondary | ICD-10-CM | POA: Diagnosis not present

## 2017-05-31 DIAGNOSIS — I440 Atrioventricular block, first degree: Secondary | ICD-10-CM | POA: Insufficient documentation

## 2017-05-31 DIAGNOSIS — I447 Left bundle-branch block, unspecified: Secondary | ICD-10-CM | POA: Diagnosis not present

## 2017-05-31 DIAGNOSIS — R748 Abnormal levels of other serum enzymes: Secondary | ICD-10-CM | POA: Insufficient documentation

## 2017-05-31 DIAGNOSIS — R531 Weakness: Secondary | ICD-10-CM | POA: Diagnosis not present

## 2017-05-31 DIAGNOSIS — R0602 Shortness of breath: Secondary | ICD-10-CM

## 2017-05-31 DIAGNOSIS — R0600 Dyspnea, unspecified: Secondary | ICD-10-CM | POA: Diagnosis not present

## 2017-05-31 DIAGNOSIS — I4891 Unspecified atrial fibrillation: Principal | ICD-10-CM | POA: Insufficient documentation

## 2017-05-31 DIAGNOSIS — R7989 Other specified abnormal findings of blood chemistry: Secondary | ICD-10-CM | POA: Diagnosis not present

## 2017-05-31 DIAGNOSIS — Z87891 Personal history of nicotine dependence: Secondary | ICD-10-CM | POA: Diagnosis not present

## 2017-05-31 DIAGNOSIS — R778 Other specified abnormalities of plasma proteins: Secondary | ICD-10-CM

## 2017-05-31 DIAGNOSIS — F419 Anxiety disorder, unspecified: Secondary | ICD-10-CM | POA: Diagnosis not present

## 2017-05-31 DIAGNOSIS — R002 Palpitations: Secondary | ICD-10-CM | POA: Insufficient documentation

## 2017-05-31 DIAGNOSIS — Z96641 Presence of right artificial hip joint: Secondary | ICD-10-CM | POA: Diagnosis not present

## 2017-05-31 DIAGNOSIS — Z7982 Long term (current) use of aspirin: Secondary | ICD-10-CM | POA: Insufficient documentation

## 2017-05-31 LAB — CBC
HEMATOCRIT: 40.2 % (ref 35.0–47.0)
Hemoglobin: 13.4 g/dL (ref 12.0–16.0)
MCH: 33.1 pg (ref 26.0–34.0)
MCHC: 33.5 g/dL (ref 32.0–36.0)
MCV: 98.9 fL (ref 80.0–100.0)
Platelets: 338 10*3/uL (ref 150–440)
RBC: 4.06 MIL/uL (ref 3.80–5.20)
RDW: 14.4 % (ref 11.5–14.5)
WBC: 7.2 10*3/uL (ref 3.6–11.0)

## 2017-05-31 LAB — BASIC METABOLIC PANEL
Anion gap: 11 (ref 5–15)
BUN: 26 mg/dL — AB (ref 6–20)
CALCIUM: 9.1 mg/dL (ref 8.9–10.3)
CO2: 23 mmol/L (ref 22–32)
Chloride: 103 mmol/L (ref 101–111)
Creatinine, Ser: 1.13 mg/dL — ABNORMAL HIGH (ref 0.44–1.00)
GFR calc Af Amer: 46 mL/min — ABNORMAL LOW (ref >60)
GFR, EST NON AFRICAN AMERICAN: 40 mL/min — AB (ref >60)
GLUCOSE: 107 mg/dL — AB (ref 65–99)
Potassium: 4 mmol/L (ref 3.5–5.1)
Sodium: 137 mmol/L (ref 135–145)

## 2017-05-31 LAB — URINALYSIS, COMPLETE (UACMP) WITH MICROSCOPIC
BACTERIA UA: NONE SEEN
BILIRUBIN URINE: NEGATIVE
Glucose, UA: NEGATIVE mg/dL
Hgb urine dipstick: NEGATIVE
Ketones, ur: NEGATIVE mg/dL
Leukocytes, UA: NEGATIVE
NITRITE: NEGATIVE
Protein, ur: NEGATIVE mg/dL
Specific Gravity, Urine: 1.006 (ref 1.005–1.030)
pH: 5 (ref 5.0–8.0)

## 2017-05-31 LAB — MRSA PCR SCREENING: MRSA by PCR: NEGATIVE

## 2017-05-31 LAB — TROPONIN I
Troponin I: 0.09 ng/mL (ref <0.03)
Troponin I: 0.09 ng/mL (ref <0.03)
Troponin I: 0.07 ng/mL

## 2017-05-31 LAB — VITAMIN B12: VITAMIN B 12: 215 pg/mL (ref 180–914)

## 2017-05-31 LAB — SEDIMENTATION RATE: Sed Rate: 12 mm/h (ref 0–30)

## 2017-05-31 LAB — TSH: TSH: 1.716 u[IU]/mL (ref 0.350–4.500)

## 2017-05-31 MED ORDER — ADULT MULTIVITAMIN W/MINERALS CH
2.0000 | ORAL_TABLET | Freq: Every day | ORAL | Status: DC
  Administered 2017-06-01: 2 via ORAL
  Filled 2017-05-31: qty 2

## 2017-05-31 MED ORDER — SODIUM CHLORIDE 0.9 % IV SOLN
INTRAVENOUS | Status: DC
  Administered 2017-05-31 – 2017-06-01 (×2): via INTRAVENOUS

## 2017-05-31 MED ORDER — ACETAMINOPHEN 325 MG PO TABS: 650.0000 mg | ORAL_TABLET | Freq: Three times a day (TID) | ORAL | Status: DC | PRN

## 2017-05-31 MED ORDER — ACETAMINOPHEN 650 MG RE SUPP: 650.0000 mg | Freq: Four times a day (QID) | RECTAL | Status: DC | PRN

## 2017-05-31 MED ORDER — ONDANSETRON HCL 4 MG PO TABS: 4.0000 mg | ORAL_TABLET | Freq: Four times a day (QID) | ORAL | Status: DC | PRN

## 2017-05-31 MED ORDER — ACETAMINOPHEN 325 MG PO TABS: 650.0000 mg | ORAL_TABLET | Freq: Four times a day (QID) | ORAL | Status: DC | PRN

## 2017-05-31 MED ORDER — ALPRAZOLAM 0.5 MG PO TABS
0.5000 mg | ORAL_TABLET | Freq: Three times a day (TID) | ORAL | Status: DC | PRN
  Administered 2017-05-31: 0.25 mg via ORAL
  Filled 2017-05-31: qty 1

## 2017-05-31 MED ORDER — ASPIRIN 81 MG PO CHEW
324.0000 mg | CHEWABLE_TABLET | Freq: Once | ORAL | Status: AC
  Administered 2017-05-31: 324 mg via ORAL
  Filled 2017-05-31: qty 4

## 2017-05-31 MED ORDER — ENOXAPARIN SODIUM 30 MG/0.3ML ~~LOC~~ SOLN
30.0000 mg | SUBCUTANEOUS | Status: DC
  Administered 2017-05-31: 30 mg via SUBCUTANEOUS
  Filled 2017-05-31: qty 0.3

## 2017-05-31 MED ORDER — CALCIUM CARBONATE ANTACID 500 MG PO CHEW
300.0000 mg | CHEWABLE_TABLET | Freq: Every day | ORAL | Status: DC
  Filled 2017-05-31: qty 3
  Filled 2017-05-31: qty 2

## 2017-05-31 MED ORDER — ASPIRIN EC 81 MG PO TBEC
81.0000 mg | DELAYED_RELEASE_TABLET | Freq: Every day | ORAL | Status: DC
  Administered 2017-06-01: 81 mg via ORAL
  Filled 2017-05-31: qty 1

## 2017-05-31 MED ORDER — ONDANSETRON HCL 4 MG/2ML IJ SOLN: 4.0000 mg | Freq: Four times a day (QID) | INTRAMUSCULAR | Status: DC | PRN

## 2017-05-31 MED ORDER — TIZANIDINE HCL 2 MG PO TABS
2.0000 mg | ORAL_TABLET | Freq: Three times a day (TID) | ORAL | Status: DC | PRN
  Filled 2017-05-31: qty 1

## 2017-05-31 MED ORDER — RISAQUAD PO CAPS
1.0000 | ORAL_CAPSULE | Freq: Every day | ORAL | Status: DC
  Administered 2017-06-01: 1 via ORAL
  Filled 2017-05-31 (×2): qty 1

## 2017-05-31 NOTE — ED Triage Notes (Signed)
Pt arrived via EMS from [place] for reports of weakness and feeling her heart pound. Pt reports had an episode similar on 05/17/17 but it resolved very quickly. Pt denies CP. Alert and oriented on arrival. EMS reports 122/88, 80 HR, 97% on room air, a-fib on 12-lead. Pt denies cardiac history.

## 2017-05-31 NOTE — Progress Notes (Signed)
Pharmacist - Prescriber Communication  Enoxaparin dose modified to 30 mg subcutaneously once daily due to CrCl less than 30 mL/min.  Detrice Cales A. [place], [place].D., BCPS Clinical Pharmacist 05/31/2017 14:05

## 2017-05-31 NOTE — ED Provider Notes (Signed)
[facility] Emergency Department Provider Note   ____________________________________________   I have reviewed the triage vital signs and the nursing notes.   HISTORY  Chief Complaint Weakness   History limited by: Not Limited   HPI Bridget Mendez is a 83 y.o. female who presents to the emergency department today because of concern for episode of weakness and dizziness.  DURATION:roughly 3 weeks ago was first episode TIMING: intermittent episodes QUALITY: felt like she was going to pass out CONTEXT: patient states she had her first epidose roughly 3 weeks ago. Acute onset of dizziness. She sat down and the episode passed. However starting yesterday she felt the symptoms returning. Occurred again today. MODIFYING FACTORS: None ASSOCIATED SYMPTOMS: denies any chest pain. Denies shortness of breath. Has had palpitations.  Per medical record review patient has a history of anxiety.  Past Medical History:  Diagnosis Date  . Allergy   . Anxiety   . Depression   . GERD (gastroesophageal reflux disease)   . Osteoarthritis   . Osteoporosis     Patient Active Problem List   Diagnosis Date Noted  . Orthostatic hypotension 05/18/2015  . IBS (irritable bowel syndrome) 02/10/2015  . Advance directive discussed with patient 12/17/2014  . Routine general medical examination at a health care facility 10/17/2011  . Episodic mood disorder ([place]) 04/28/2010  . ALLERGIC RHINITIS 09/15/2008  . GERD 10/24/2006  . Primary osteoarthritis involving multiple joints 10/24/2006  . Osteoporosis 10/24/2006    Past Surgical History:  Procedure Laterality Date  . CESAREAN SECTION     x2  . HEMORRHOID SURGERY    . INGUINAL HERNIA REPAIR  02/2009   Dr.Wilton Tamala Julian  . KNEE ARTHROSCOPY  1998 & 2005   both knees  . TOTAL HIP ARTHROPLASTY Right 07/29/2014   Procedure: RIGHT TOTAL HIP ARTHROPLASTY ANTERIOR APPROACH;  Surgeon: Hessie Dibble, MD;  Location: [place];   Service: Orthopedics;  Laterality: Right;    Prior to Admission medications   Medication Sig Start Date End Date Taking? Authorizing Provider  acetaminophen (TYLENOL) 650 MG CR tablet Take 650 mg by mouth 2 (two) times daily.    [provider]  ALPRAZolam [name]) 0.5 MG tablet take 1/2 to 1 tablet by mouth twice a day if needed for anxiety 05/05/17   Viviana Simpler I, MD  aspirin 81 MG tablet Take 81 mg by mouth daily.    [provider]  calcium carbonate (TUMS EX) 750 MG chewable tablet Chew 1-2 tablets by mouth daily.    [provider]  glucosamine-chondroitin 500-400 MG tablet Take 1 tablet by mouth daily.      [provider]  Multiple Vitamin (MULTIVITAMIN) tablet Take 2 tablets by mouth daily.     [provider]  Probiotic Product (PROBIOTIC DAILY) CAPS Take 1 capsule by mouth daily.     [provider]  tiZANidine (ZANAFLEX) 2 MG tablet take 1 tablet by mouth every 8 hours if needed for muscle spasm 05/26/17   Venia Carbon, MD    Allergies Patient has no known allergies.  Family History  Problem Relation Age of Onset  . Parkinsonism Mother   . Cancer Maternal Grandmother     Social History Social History   Tobacco Use  . Smoking status: Former Smoker    Packs/day: 1.00    Types: Cigarettes    Last attempt to quit: 07/11/1988    Years since quitting: 28.9  . Smokeless tobacco: Never Used  Substance Use  Topics  . Alcohol use: Yes    Comment: 2 drinks in the evening  . Drug use: No    Review of Systems Constitutional: No fever/chills Eyes: No visual changes. ENT: No sore throat. Cardiovascular: Positive for palpitations. Respiratory: Denies shortness of breath. Gastrointestinal: No abdominal pain.  No nausea, no vomiting.  No diarrhea.   Genitourinary: Negative for dysuria. Musculoskeletal: Negative for back pain. Skin: Negative for rash. Neurological: Positive for  dizziness.  ____________________________________________   PHYSICAL EXAM:  VITAL SIGNS: ED Triage Vitals  Enc Vitals Group     BP 05/31/17 1051 115/83     Pulse Rate 05/31/17 1051 83     Resp 05/31/17 1051 16     Temp 05/31/17 1051 98.1 F (36.7 C)     Temp Source 05/31/17 1051 Oral     SpO2 05/31/17 1051 96 %     Weight 05/31/17 1052 160 lb (72.6 kg)     Height 05/31/17 1052 5\' 3"  (1.6 m)   Constitutional: Alert and oriented. Well appearing and in no distress. Eyes: Conjunctivae are normal.  ENT   Head: Normocephalic and atraumatic.   Nose: No congestion/rhinnorhea.   Mouth/Throat: Mucous membranes are moist.   Neck: No stridor. Hematological/Lymphatic/Immunilogical: No cervical lymphadenopathy. Cardiovascular: Normal rate, irregular rhythm.  No murmurs, rubs, or gallops. Respiratory: Normal respiratory effort without tachypnea nor retractions. Breath sounds are clear and equal bilaterally. No wheezes/rales/rhonchi. Gastrointestinal: Soft and non tender. No rebound. No guarding.  Genitourinary: Deferred Musculoskeletal: Normal range of motion in all extremities. No lower extremity edema. Neurologic:  Normal speech and language. No gross focal neurologic deficits are appreciated.  Skin:  Skin is warm, dry and intact. No rash noted. Psychiatric: Mood and affect are normal. Speech and behavior are normal. Patient exhibits appropriate insight and judgment.  ____________________________________________    LABS (pertinent positives/negatives)  Trop 0.09 CBC wnl BMP cr 1.13, k 4.0 UA not consistent with infection  ____________________________________________   EKG  I, Nance Pear, attending physician, personally viewed and interpreted this EKG  EKG Time: 1049 Rate: 85 Rhythm: normal sinus rhythm with 1st degree av block and PVC Axis: left axis deviation Intervals: qtc 459 QRS: LBBB ST changes: no st elevation Impression: abnormal  ekg   ____________________________________________    RADIOLOGY  None  ____________________________________________   PROCEDURES  Procedures  CRITICAL CARE Performed by: Nance Pear   Total critical care time: 30 minutes  Critical care time was exclusive of separately billable procedures and treating other patients.  Critical care was necessary to treat or prevent imminent or life-threatening deterioration.  Critical care was time spent personally by me on the following activities: development of treatment plan with patient and/or surrogate as well as nursing, discussions with consultants, evaluation of patient's response to treatment, examination of patient, obtaining history from patient or surrogate, ordering and performing treatments and interventions, ordering and review of laboratory studies, ordering and review of radiographic studies, pulse oximetry and re-evaluation of patient's condition.  ____________________________________________   INITIAL IMPRESSION / ASSESSMENT AND PLAN / ED COURSE  Pertinent labs & imaging results that were available during my care of the patient were reviewed by me and considered in my medical decision making (see chart for details).  Patient presents with dizziness and weakness. ddx would include anemia, electrolyte issue, cardiac cause, infection amongst other etiologies. Patient's EKG shows new 1st degree AV block. Troponin is elevated to 0.09. Concern for cardiac etiology of the patient's symptoms. Discussed this with patient. Discussed plan for  hospitalization. Discussed with hospitalist service.   ____________________________________________   FINAL CLINICAL IMPRESSION(S) / ED DIAGNOSES  Final diagnoses:  Elevated troponin  Weakness  1st degree AV block     Note: This dictation was prepared with Dragon dictation. Any transcriptional errors that result from this process are unintentional     Nance Pear,  MD 05/31/17 1444

## 2017-05-31 NOTE — H&P (Signed)
[place] [facility] [place] NAME: Bridget Mendez    MR#:  [id number]  DATE OF BIRTH:  [birth year]  DATE OF ADMISSION:  05/31/2017  PRIMARY CARE PHYSICIAN: Venia Carbon, MD   REQUESTING/REFERRING PHYSICIAN: Nance Pear MD  CHIEF COMPLAINT:   Chief Complaint  Patient presents with  . Weakness    HISTORY OF PRESENT ILLNESS: Bridget Mendez  is a 83 y.o. female with a known history of allergy, depression, GERD, osteoarthritis who is presenting with generalized weakness.  Patient states that her symptoms started yesterday.  She was at the store when all of a sudden she got very weak and had to sit down.  For a few minutes.  She also started breathing heavy at that time.  Then once she sat down her breathing normalized.  Similar thing happen today.  Patient came to the emergency room.  Noted to have a slightly abnormal troponin.  She denies any fevers chills denies any nausea vomiting or area he denies any chest pain palpitations or syncope.  PAST MEDICAL HISTORY:   Past Medical History:  Diagnosis Date  . Allergy   . Anxiety   . Depression   . GERD (gastroesophageal reflux disease)   . Osteoarthritis   . Osteoporosis     PAST SURGICAL HISTORY:  Past Surgical History:  Procedure Laterality Date  . CESAREAN SECTION     x2  . HEMORRHOID SURGERY    . INGUINAL HERNIA REPAIR  02/2009   Dr.Wilton Tamala Julian  . KNEE ARTHROSCOPY  1998 & 2005   both knees  . TOTAL HIP ARTHROPLASTY Right 07/29/2014   Procedure: RIGHT TOTAL HIP ARTHROPLASTY ANTERIOR APPROACH;  Surgeon: Hessie Dibble, MD;  Location: [place];  Service: Orthopedics;  Laterality: Right;    SOCIAL HISTORY:  Social History   Tobacco Use  . Smoking status: Former Smoker    Packs/day: 1.00    Types: Cigarettes    Last attempt to quit: 07/11/1988    Years since quitting: 28.9  . Smokeless tobacco: Never Used  Substance Use Topics  . Alcohol use: Yes    Comment: 2 drinks in the evening     FAMILY HISTORY:  Family History  Problem Relation Age of Onset  . Parkinsonism Mother   . Cancer Maternal Grandmother     DRUG ALLERGIES: No Known Allergies  REVIEW OF SYSTEMS:   CONSTITUTIONAL: No fever, positive fatigue and weakness EYES: No blurred or double vision.  EARS, NOSE, AND THROAT: No tinnitus or ear pain.  RESPIRATORY: No cough, shortness of breath, wheezing or hemoptysis.  CARDIOVASCULAR: No chest pain, orthopnea, edema.  GASTROINTESTINAL: No nausea, vomiting, diarrhea or abdominal pain.  GENITOURINARY: No dysuria, hematuria.  ENDOCRINE: No polyuria, nocturia,  HEMATOLOGY: No anemia, easy bruising or bleeding SKIN: No rash or lesion. MUSCULOSKELETAL: No joint pain or arthritis.   NEUROLOGIC: No tingling, numbness, weakness.  PSYCHIATRY: Positive anxiety positive depression.   MEDICATIONS AT HOME:  Prior to Admission medications   Medication Sig Start Date End Date Taking? Authorizing Provider  ALPRAZolam [name]) 0.5 MG tablet take 1/2 to 1 tablet by mouth twice a day if needed for anxiety 05/05/17  Yes Viviana Simpler I, MD  aspirin 81 MG tablet Take 81 mg by mouth daily.   Yes [provider]  Multiple Vitamin (MULTIVITAMIN) tablet Take 2 tablets by mouth daily.    Yes [provider]  Probiotic Product (PROBIOTIC DAILY) CAPS Take 1 capsule by mouth daily.  Yes [provider]  acetaminophen (TYLENOL) 650 MG CR tablet Take 650 mg by mouth 2 (two) times daily.    [provider]  calcium carbonate (TUMS EX) 750 MG chewable tablet Chew 1-2 tablets by mouth daily.    [provider]  tiZANidine (ZANAFLEX) 2 MG tablet take 1 tablet by mouth every 8 hours if needed for muscle spasm 05/26/17   Venia Carbon, MD      PHYSICAL EXAMINATION:   VITAL SIGNS: Blood pressure 115/83, pulse 83, temperature 98.1 F (36.7 C), temperature source Oral, resp. rate 16, height 5\' 3"  (1.6 m), weight 160 lb (72.6 kg), SpO2 96  %.  GENERAL:  83 y.o.-year-old patient lying in the bed with no acute distress.  EYES: Pupils equal, round, reactive to light and accommodation. No scleral icterus. Extraocular muscles intact.  HEENT: Head atraumatic, normocephalic. Oropharynx and nasopharynx clear.  NECK:  Supple, no jugular venous distention. No thyroid enlargement, no tenderness.  LUNGS: Normal breath sounds bilaterally, no wheezing, rales,rhonchi or crepitation. No use of accessory muscles of respiration.  CARDIOVASCULAR: S1, S2 normal.  Faint systolic murmurs, rubs, or gallops.  ABDOMEN: Soft, nontender, nondistended. Bowel sounds present. No organomegaly or mass.  EXTREMITIES: No pedal edema, cyanosis, or clubbing.  NEUROLOGIC: Cranial nerves II through XII are intact. Muscle strength 5/5 in all extremities. Sensation intact. Gait not checked.  PSYCHIATRIC: The patient is alert and oriented x 3.  SKIN: No obvious rash, lesion, or ulcer.   LABORATORY PANEL:   CBC Recent Labs  Lab 05/31/17 1056  WBC 7.2  HGB 13.4  HCT 40.2  PLT 338  MCV 98.9  MCH 33.1  MCHC 33.5  RDW 14.4   ------------------------------------------------------------------------------------------------------------------  Chemistries  Recent Labs  Lab 05/31/17 1056  NA 137  K 4.0  CL 103  CO2 23  GLUCOSE 107*  BUN 26*  CREATININE 1.13*  CALCIUM 9.1   ------------------------------------------------------------------------------------------------------------------ estimated creatinine clearance is 28.4 mL/min (A) (by C-G formula based on SCr of 1.13 mg/dL (H)). ------------------------------------------------------------------------------------------------------------------ No results for input(s): TSH, T4TOTAL, T3FREE, THYROIDAB in the last 72 hours.  Invalid input(s): FREET3   Coagulation profile No results for input(s): INR, PROTIME in the last 168  hours. ------------------------------------------------------------------------------------------------------------------- No results for input(s): DDIMER in the last 72 hours. -------------------------------------------------------------------------------------------------------------------  Cardiac Enzymes Recent Labs  Lab 05/31/17 1056  TROPONINI 0.09*   ------------------------------------------------------------------------------------------------------------------ Invalid input(s): POCBNP  ---------------------------------------------------------------------------------------------------------------  Urinalysis    Component Value Date/Time   COLORURINE YELLOW (A) 05/31/2017 1158   APPEARANCEUR HAZY (A) 05/31/2017 1158   LABSPEC 1.006 05/31/2017 1158   PHURINE 5.0 05/31/2017 1158   GLUCOSEU NEGATIVE 05/31/2017 1158   HGBUR NEGATIVE 05/31/2017 1158   HGBUR negative 03/18/2009 0901   BILIRUBINUR NEGATIVE 05/31/2017 1158   BILIRUBINUR neg 10/01/2010 1019   KETONESUR NEGATIVE 05/31/2017 1158   PROTEINUR NEGATIVE 05/31/2017 1158   UROBILINOGEN 0.2 07/29/2014 0927   NITRITE NEGATIVE 05/31/2017 1158   LEUKOCYTESUR NEGATIVE 05/31/2017 1158     RADIOLOGY: No results found.  EKG: Orders placed or performed during the hospital encounter of 05/31/17  . ED EKG  . ED EKG  . EKG 12-Lead  . EKG 12-Lead    IMPRESSION AND PLAN: Patient is a 83 year old presenting with generalized weakness  1.  Generalized weakness etiology unclear possibly due to some dehydration We will check a B12 level, check a TSH, vitamin D level Give IV fluids Due to elevated troponin we will check echocardiogram of the heart  2.  Elevated troponin  Place patient on aspirin Echocardiogram of the heart Cycle enzymes If enzymes increase then will have cardiology see the patient  3.  Anxiety disorder continue Xanax  4.  Miscellaneous Lovenox for DVT prophylaxis   All the records are reviewed and  case discussed with ED provider. Management plans discussed with the patient, family and they are in agreement.  CODE STATUS: Code Status History    Date Active Date Inactive Code Status Order ID Comments User Context   07/29/2014 18:13 07/31/2014 19:28 Full Code [id number]  Rich Fuchs, PA-C Inpatient    Advance Directive Documentation     Most Recent Value  Type of Advance Directive  Healthcare Power of Attorney, Living will  Pre-existing out of facility DNR order (yellow form or pink MOST form)  No data  "MOST" Form in Place?  No data       TOTAL TIME TAKING CARE OF THIS PATIENT: 55 minutes.    Dustin Flock M.D on 05/31/2017 at 1:14 PM  Between 7am to 6pm - Pager - [phone]  After 6pm go to [email or web address] - password [email or web address] [facility]  Office  [phone]  CC: Primary care physician; Venia Carbon, MD

## 2017-05-31 NOTE — Telephone Encounter (Signed)
Pt calling c/o weakess, shaking, and "wobbly" when walking. Pt denies dizziness, fever, and chest pain. Pt states she can only take about six steps before feeling so weak and shaky that she must sit down. Pt states she becomes short of breath and feels like her heart is "pounding out of her chest."  Pt walks with assistance with a four wheel walker. Protocol recommends Urgent Care and pt advised to go to nearest UCC/ED. Pt wanted NT to see if she could see her PCP. Called [place] at [facility] and also recommended Urgent care. Flow coordinator also advised pt to go to Urgent Care. Pt has no transportation states she is waiting for the neighbors to come home. [place] where she resides at [phone] and spoke to [place] who asked me to tell her to pull a cord to notify the nurse at the facility. Sharee Pimple informed that the pt needs to be seen ASAP at the nearest Urgent Care or ED. Asked pt to notify the nurse now and stayed on the line until she did so.  Reason for Disposition . [1] MODERATE weakness (i.e., interferes with work, school, normal activities) AND [2] cause unknown  (Exceptions: weakness with acute minor illness, or weakness from poor fluid intake)  Answer Assessment - Initial Assessment Questions 1. DESCRIPTION: "Describe how you are feeling."     Having weakness with walking  And SOB and shakiness describes hear pounding.  2. SEVERITY: "How bad is it?"  "Can you stand and walk?"   - MILD - Feels weak or tired, but does not interfere with work, school or normal activities   - [place] to stand and walk; weakness interferes with work, school, or normal activities   - SEVERE - Unable to stand or walk     Limited ability to walk but cannot stand to due anything due to shakiness 3. ONSET:  "When did the weakness begin?"     Had November 7th and then then yesterday 05/30/17 4. CAUSE: "What do you think is causing the weakness?"     Pt wondering about the flexaril 5. MEDICINES:  "Have you recently started a new medicine or had a change in the amount of a medicine?"     Muscle relaxer  6. OTHER SYMPTOMS: "Do you have any other symptoms?" (e.g., chest pain, fever, cough, SOB, vomiting, diarrhea, bleeding)     No chest pain, coough Does have SOB with exertion, no vomiting, diarrhea, bleeding 7. PREGNANCY: "Is there any chance you are pregnant?" "When was your last menstrual period?"     n/a  Protocols used: WEAKNESS (GENERALIZED) AND FATIGUE-A-AH

## 2017-05-31 NOTE — ED Notes (Signed)
Date and time results received: 05/31/17 1150 (use smartphrase ".now" to insert current time)  Test: troponin Critical Value: 0.09  Name of Provider Notified: Dr. Archie Balboa  Orders Received? Or Actions Taken?: Orders Received - See Orders for details

## 2017-05-31 NOTE — Progress Notes (Signed)
*  PRELIMINARY RESULTS* Echocardiogram 2D Echocardiogram has been performed.  Sherrie Sport 05/31/2017, 3:50 PM

## 2017-05-31 NOTE — Progress Notes (Signed)
CRITICAL VALUE ALERT  Critical Value:  Troponin 0.09  Date & Time Notied:  05/31/17,  1550   Provider Notified: Dr. Dustin Flock  Orders Received/Actions taken: Patient admitted for elevated troponin, cardiac workup underway.

## 2017-06-01 ENCOUNTER — Observation Stay: Payer: Medicare Other

## 2017-06-01 DIAGNOSIS — E860 Dehydration: Secondary | ICD-10-CM | POA: Diagnosis not present

## 2017-06-01 DIAGNOSIS — R531 Weakness: Secondary | ICD-10-CM | POA: Diagnosis not present

## 2017-06-01 DIAGNOSIS — I4891 Unspecified atrial fibrillation: Secondary | ICD-10-CM | POA: Diagnosis not present

## 2017-06-01 DIAGNOSIS — R748 Abnormal levels of other serum enzymes: Secondary | ICD-10-CM | POA: Diagnosis not present

## 2017-06-01 DIAGNOSIS — R0602 Shortness of breath: Secondary | ICD-10-CM | POA: Diagnosis not present

## 2017-06-01 DIAGNOSIS — M79606 Pain in leg, unspecified: Secondary | ICD-10-CM | POA: Diagnosis not present

## 2017-06-01 DIAGNOSIS — Z743 Need for continuous supervision: Secondary | ICD-10-CM | POA: Diagnosis not present

## 2017-06-01 LAB — CBC
HEMATOCRIT: 35.8 % (ref 35.0–47.0)
HEMOGLOBIN: 12 g/dL (ref 12.0–16.0)
MCH: 33.2 pg (ref 26.0–34.0)
MCHC: 33.5 g/dL (ref 32.0–36.0)
MCV: 99 fL (ref 80.0–100.0)
Platelets: 301 10*3/uL (ref 150–440)
RBC: 3.62 MIL/uL — AB (ref 3.80–5.20)
RDW: 14.2 % (ref 11.5–14.5)
WBC: 5.2 10*3/uL (ref 3.6–11.0)

## 2017-06-01 LAB — BASIC METABOLIC PANEL
ANION GAP: 8 (ref 5–15)
BUN: 24 mg/dL — ABNORMAL HIGH (ref 6–20)
CHLORIDE: 110 mmol/L (ref 101–111)
CO2: 21 mmol/L — AB (ref 22–32)
CREATININE: 0.97 mg/dL (ref 0.44–1.00)
Calcium: 8.4 mg/dL — ABNORMAL LOW (ref 8.9–10.3)
GFR calc non Af Amer: 48 mL/min — ABNORMAL LOW (ref >60)
GFR, EST AFRICAN AMERICAN: 56 mL/min — AB (ref >60)
Glucose, Bld: 99 mg/dL (ref 65–99)
Potassium: 4.1 mmol/L (ref 3.5–5.1)
SODIUM: 139 mmol/L (ref 135–145)

## 2017-06-01 LAB — TROPONIN I: TROPONIN I: 0.07 ng/mL — AB (ref <0.03)

## 2017-06-01 LAB — ECHOCARDIOGRAM COMPLETE
HEIGHTINCHES: 63 in
WEIGHTICAEL: 2560 [oz_av]

## 2017-06-01 LAB — VITAMIN D 25 HYDROXY (VIT D DEFICIENCY, FRACTURES): VIT D 25 HYDROXY: 29.5 ng/mL — AB (ref 30.0–100.0)

## 2017-06-01 MED ORDER — ALPRAZOLAM 0.5 MG PO TABS: ORAL_TABLET | ORAL | 0 refills | Status: DC

## 2017-06-01 MED ORDER — VITAMIN D (ERGOCALCIFEROL) 1.25 MG (50000 UNIT) PO CAPS: 50000.0000 [IU] | ORAL_CAPSULE | ORAL | 0 refills | Status: DC

## 2017-06-01 MED ORDER — VITAMIN D (ERGOCALCIFEROL) 1.25 MG (50000 UNIT) PO CAPS
50000.0000 [IU] | ORAL_CAPSULE | ORAL | Status: DC
  Administered 2017-06-01: 50000 [IU] via ORAL
  Filled 2017-06-01: qty 1

## 2017-06-01 NOTE — NC FL2 (Signed)
[place] LEVEL OF CARE SCREENING TOOL     IDENTIFICATION  Patient Name: Bridget Mendez Birthdate: 1920/04/01 Sex: female Admission Date (Current Location): 05/31/2017  [place] and [place] Number:  Engineering geologist and Address:  [facility], [address], [place], [place] [address]      Provider Number: [id number]  Attending Physician Name and Address:  Nicholes Mango, MD  Relative Name and Phone Number:       Current Level of Care: Hospital Recommended Level of Care: [place] Prior Approval Number:    Date Approved/Denied:   PASRR Number: [id number]  Discharge Plan: SNF    Current Diagnoses: Patient Active Problem List   Diagnosis Date Noted  . Weakness 05/31/2017  . Orthostatic hypotension 05/18/2015  . IBS (irritable bowel syndrome) 02/10/2015  . Advance directive discussed with patient 12/17/2014  . Routine general medical examination at a health care facility 10/17/2011  . Episodic mood disorder ([place]) 04/28/2010  . ALLERGIC RHINITIS 09/15/2008  . GERD 10/24/2006  . Primary osteoarthritis involving multiple joints 10/24/2006  . Osteoporosis 10/24/2006    Orientation RESPIRATION BLADDER Height & Weight     Self, Time, Situation, Place  Normal Continent Weight: 160 lb (72.6 kg) Height:  5\' 3"  (160 cm)  BEHAVIORAL SYMPTOMS/MOOD NEUROLOGICAL BOWEL NUTRITION STATUS      Continent Diet(Heart healthy)  AMBULATORY STATUS COMMUNICATION OF NEEDS Skin   Supervision Verbally Normal                       Personal Care Assistance Level of Assistance  Bathing, Feeding, Dressing Bathing Assistance: Limited assistance Feeding assistance: Independent Dressing Assistance: Limited assistance     Functional Limitations Info             SPECIAL CARE FACTORS FREQUENCY                       Contractures Contractures Info: Not present    Additional Factors Info  Code Status, Allergies,  Psychotropic Code Status Info: Full Allergies Info: No Known Allergies Psychotropic Info: Xanax         Current Medications (06/01/2017):  This is the current hospital active medication list Current Facility-Administered Medications  Medication Dose Route Frequency Provider Last Rate Last Dose  . 0.9 %  sodium chloride infusion   Intravenous Continuous Dustin Flock, MD 75 mL/hr at 06/01/17 0455    . acetaminophen (TYLENOL) tablet 650 mg  650 mg Oral Q6H PRN Dustin Flock, MD       Or  . acetaminophen (TYLENOL) suppository 650 mg  650 mg Rectal Q6H PRN Dustin Flock, MD      . acetaminophen (TYLENOL) tablet 650 mg  650 mg Oral Q8H PRN Dustin Flock, MD      . acidophilus (RISAQUAD) capsule 1 capsule  1 capsule Oral Daily Dustin Flock, MD   1 capsule at 06/01/17 1031  . ALPRAZolam [name]) tablet 0.5 mg  0.5 mg Oral TID PRN Dustin Flock, MD   0.25 mg at 05/31/17 2216  . aspirin EC tablet 81 mg  81 mg Oral Daily Dustin Flock, MD   81 mg at 06/01/17 1031  . calcium carbonate (TUMS - dosed in mg elemental calcium) chewable tablet 300-600 mg of elemental calcium  300-600 mg of elemental calcium Oral Daily Dustin Flock, MD      . enoxaparin (LOVENOX) injection 30 mg  30 mg Subcutaneous Q24H Dustin Flock, MD  30 mg at 05/31/17 1745  . multivitamin with minerals tablet 2 tablet  2 tablet Oral Daily Dustin Flock, MD   2 tablet at 06/01/17 1031  . ondansetron (ZOFRAN) tablet 4 mg  4 mg Oral Q6H PRN Dustin Flock, MD       Or  . ondansetron (ZOFRAN) injection 4 mg  4 mg Intravenous Q6H PRN Dustin Flock, MD      . tiZANidine (ZANAFLEX) tablet 2 mg  2 mg Oral Q8H PRN Dustin Flock, MD      . Vitamin D (Ergocalciferol) (DRISDOL) capsule 50,000 Units  50,000 Units Oral Q7 days Nicholes Mango, MD   50,000 Units at 06/01/17 1031     Discharge Medications: Please see discharge summary for a list of discharge medications.  Relevant Imaging Results:  Relevant Lab  Results:   Additional Information SS# [id number]  Zettie Pho, LCSW

## 2017-06-01 NOTE — Plan of Care (Signed)
Patient condition improving

## 2017-06-01 NOTE — Progress Notes (Signed)
Patient is being discharged home discharge instruction provided , iv removed tele removed

## 2017-06-01 NOTE — Discharge Summary (Signed)
[facility] [place] NAME: Bridget Mendez    MR#:  [id number]  DATE OF BIRTH:  [birth year]  DATE OF ADMISSION:  05/31/2017 ADMITTING PHYSICIAN: Dustin Flock, MD  DATE OF DISCHARGE: 06/01/17  PRIMARY CARE PHYSICIAN: Venia Carbon, MD    ADMISSION DIAGNOSIS:  Weakness [R53.1] SOB (shortness of breath) [R06.02] Elevated troponin [R74.8]  DISCHARGE DIAGNOSIS:  Active Problems:   Weakness   SECONDARY DIAGNOSIS:   Past Medical History:  Diagnosis Date  . Allergy   . Anxiety   . Depression   . GERD (gastroesophageal reflux disease)   . Osteoarthritis   . Osteoporosis     HOSPITAL COURSE:   HISTORY OF PRESENT ILLNESS: Bridget Mendez  is a 83 y.o. female with a known history of allergy, depression, GERD, osteoarthritis who is presenting with generalized weakness.  Patient states that her symptoms started yesterday.  She was at the store when all of a sudden she got very weak and had to sit down.  For a few minutes.  She also started breathing heavy at that time.  Then once she sat down her breathing normalized.  Similar thing happen today.  Patient came to the emergency room.  Noted to have a slightly abnormal troponin.  She denies any fevers chills denies any nausea vomiting or area he denies any chest pain palpitations or syncope  #New onset atrial fibrillation with palpitations Rate controlled Continue baby aspirin Echocardiogram with normal ejection fraction 60-65% Seen by cardiology DR [facility], not recommending noac at this time and recommending outpatient follow-up in 1-2 weeks.  Okay to discharge from their standpoint Elevated troponin is secondary to demand ischemia from A. fib  #.  Generalized weakness secondary to dehydration and palpitation from new onset A. fib  Clinically improved with IV fluids TSH and B12 are normal. Started on vitamin D supplements as it is low   #  Elevated troponin  patient on  aspirin Echocardiogram of the heart normal Cycle enzymes-0.09-0.07-0.07  #  Anxiety disorder continue Xanax  # Miscellaneous Lovenox for DVT prophylaxis    Physical therapy recommended home health PT  DISCHARGE CONDITIONS:   STABLE  CONSULTS OBTAINED:  Treatment Team:  Teodoro Spray, MD   PROCEDURES  NONE   DRUG ALLERGIES:  No Known Allergies  DISCHARGE MEDICATIONS:   Current Discharge Medication List    CONTINUE these medications which have NOT CHANGED   Details  ALPRAZolam (XANAX) 0.5 MG tablet take 1/2 to 1 tablet by mouth twice a day if needed for anxiety Qty: 60 tablet, Refills: 0    aspirin 81 MG tablet Take 81 mg by mouth daily.    Multiple Vitamin (MULTIVITAMIN) tablet Take 2 tablets by mouth daily.     Probiotic Product (PROBIOTIC DAILY) CAPS Take 1 capsule by mouth daily.     acetaminophen (TYLENOL) 650 MG CR tablet Take 650 mg by mouth 2 (two) times daily.    calcium carbonate (TUMS EX) 750 MG chewable tablet Chew 1-2 tablets by mouth daily.    tiZANidine (ZANAFLEX) 2 MG tablet take 1 tablet by mouth every 8 hours if needed for muscle spasm Qty: 60 tablet, Refills: 0   Associated Diagnoses: Neck pain         DISCHARGE INSTRUCTIONS:   Follow-up with primary care physician in 1 week Follow-up with cardiology Dr. Saralyn Pilar in 1 week Home health PT   DIET:  Cardiac diet  DISCHARGE CONDITION:  Stable  ACTIVITY:  Activity as tolerated  OXYGEN:  Home Oxygen: No.   Oxygen Delivery: room air  DISCHARGE LOCATION:  [facility] assisted [facility] facility  If you experience worsening of your admission symptoms, develop shortness of breath, life threatening emergency, suicidal or homicidal thoughts you must seek medical attention immediately by calling 911 or calling your MD immediately  if symptoms less severe.  You Must read complete instructions/literature along with all the possible adverse reactions/side effects for all the Medicines  you take and that have been prescribed to you. Take any new Medicines after you have completely understood and accpet all the possible adverse reactions/side effects.   Please note  You were cared for by a hospitalist during your hospital stay. If you have any questions about your discharge medications or the care you received while you were in the hospital after you are discharged, you can call the unit and asked to speak with the hospitalist on call if the hospitalist that took care of you is not available. Once you are discharged, your primary care physician will handle any further medical issues. Please note that NO REFILLS for any discharge medications will be authorized once you are discharged, as it is imperative that you return to your primary care physician (or establish a relationship with a primary care physician if you do not have one) for your aftercare needs so that they can reassess your need for medications and monitor your lab values.     Today  Chief Complaint  Patient presents with  . Weakness   Patient is feeling better than yesterday.  Feels more energetic after giving IV fluids Denies any palpitations or chest pain.  Ambulated with physical therapy in the hallway Feels okay to go back to [facility]  ROS:  CONSTITUTIONAL: Denies fevers, chills. Denies any fatigue, weakness.  EYES: Denies blurry vision, double vision, eye pain. EARS, NOSE, THROAT: Denies tinnitus, ear pain, hearing loss. RESPIRATORY: Denies cough, wheeze, shortness of breath.  CARDIOVASCULAR: Denies chest pain, palpitations, edema.  GASTROINTESTINAL: Denies nausea, vomiting, diarrhea, abdominal pain. Denies bright red blood per rectum. GENITOURINARY: Denies dysuria, hematuria. ENDOCRINE: Denies nocturia or thyroid problems. HEMATOLOGIC AND LYMPHATIC: Denies easy bruising or bleeding. SKIN: Denies rash or lesion. MUSCULOSKELETAL: Denies pain in neck, back, shoulder, knees, hips or arthritic symptoms.   NEUROLOGIC: Denies paralysis, paresthesias.  PSYCHIATRIC: Denies anxiety or depressive symptoms.   VITAL SIGNS:  Blood pressure (!) 142/76, pulse 89, temperature 97.6 F (36.4 C), temperature source Oral, resp. rate 20, height 5\' 3"  (1.6 m), weight 72.6 kg (160 lb), SpO2 94 %.  I/O:    Intake/Output Summary (Last 24 hours) at 06/01/2017 1226 Last data filed at 06/01/2017 1111 Gross per 24 hour  Intake 1365 ml  Output 1250 ml  Net 115 ml    PHYSICAL EXAMINATION:  GENERAL:  83 y.o.-year-old patient lying in the bed with no acute distress.  EYES: Pupils equal, round, reactive to light and accommodation. No scleral icterus. Extraocular muscles intact.  HEENT: Head atraumatic, normocephalic. Oropharynx and nasopharynx clear.  NECK:  Supple, no jugular venous distention. No thyroid enlargement, no tenderness.  LUNGS: Normal breath sounds bilaterally, no wheezing, rales,rhonchi or crepitation. No use of accessory muscles of respiration.  CARDIOVASCULAR: S1, S2 normal. No murmurs, rubs, or gallops.  ABDOMEN: Soft, non-tender, non-distended. Bowel sounds present. No organomegaly or mass.  EXTREMITIES: No pedal edema, cyanosis, or clubbing.  NEUROLOGIC: Cranial nerves II through XII are intact. Muscle strength at her baseline  in all extremities. Sensation  intact. Gait not checked.  PSYCHIATRIC: The patient is alert and oriented x 3.  SKIN: No obvious rash, lesion, or ulcer.   DATA REVIEW:   CBC Recent Labs  Lab 06/01/17 0134  WBC 5.2  HGB 12.0  HCT 35.8  PLT 301    Chemistries  Recent Labs  Lab 06/01/17 0134  NA 139  K 4.1  CL 110  CO2 21*  GLUCOSE 99  BUN 24*  CREATININE 0.97  CALCIUM 8.4*    Cardiac Enzymes Recent Labs  Lab 06/01/17 0134  TROPONINI 0.07*    Microbiology Results  Results for orders placed or performed during the hospital encounter of 05/31/17  MRSA PCR Screening     Status: None   Collection Time: 05/31/17  4:00 PM  Result Value Ref  Range Status   MRSA by PCR NEGATIVE NEGATIVE Final    Comment:        The GeneXpert MRSA Assay (FDA approved for NASAL specimens only), is one component of a comprehensive MRSA colonization surveillance program. It is not intended to diagnose MRSA infection nor to guide or monitor treatment for MRSA infections.     RADIOLOGY:  Dg Chest Port 1 View  Result Date: 06/01/2017 CLINICAL DATA:  Short of breath EXAM: PORTABLE CHEST 1 VIEW COMPARISON:  07/10/2014 FINDINGS: Heart size upper normal. Atherosclerotic aortic arch. Negative for heart failure. Minimal left lower lobe atelectasis. Negative for pneumonia IMPRESSION: Minimal left lower lobe atelectasis. Electronically Signed   By: Franchot Gallo M.D.   On: 06/01/2017 07:22    EKG:   Orders placed or performed during the hospital encounter of 05/31/17  . ED EKG  . ED EKG  . EKG 12-Lead  . EKG 12-Lead      Management plans discussed with the patient, family and they are in agreement.  CODE STATUS:     Code Status Orders  (From admission, onward)        Start     Ordered   05/31/17 1337  Full code  Continuous     05/31/17 1338    Code Status History    Date Active Date Inactive Code Status Order ID Comments User Context   07/29/2014 18:13 07/31/2014 19:28 Full Code [id number]  Rich Fuchs, PA-C Inpatient    Advance Directive Documentation     Most Recent Value  Type of Advance Directive  Living will  Pre-existing out of facility DNR order (yellow form or pink MOST form)  No data  "MOST" Form in Place?  No data      TOTAL TIME TAKING CARE OF THIS PATIENT: 43  minutes.   Note: This dictation was prepared with Dragon dictation along with smaller phrase technology. Any transcriptional errors that result from this process are unintentional.   @[facility] @  on 06/01/2017 at 12:26 PM  Between 7am to 6pm - Pager - [phone]  After 6pm go to [email or web address] - password [email or web address] [place] [facility]   Office  [phone]  CC: Primary care physician; Venia Carbon, MD

## 2017-06-01 NOTE — Clinical Social Work Note (Addendum)
CSW spoke with Crystal at [facility] to discuss discharge planning for this patient. The plan is for the patient to discharge to [facility] for respite care. Crystal will call back with room and report number when able.    The patient will discharge to room 333 at [facility], and the nurse can call report to [phone]. All documentation has been sent to the facility. The patient will transport via non-emergent EMS.  Santiago Bumpers, MSW, [facility] Presser [phone]

## 2017-06-01 NOTE — Discharge Instructions (Signed)
Follow-up with primary care physician in 1 week Follow-up with cardiology Dr. Saralyn Pilar in 1 week Home health PT

## 2017-06-01 NOTE — Progress Notes (Signed)
Patient remains alert and oriented denies any pain, vss, rounded with MD at bedside, ambulate with PT around nurses station no distress noted

## 2017-06-01 NOTE — Clinical Social Work Note (Signed)
Clinical Social Work Assessment  Patient Details  Name: Bridget Mendez MRN: [id number] Date of Birth: 27-Mar-1920  Date of referral:  06/01/17               Reason for consult:  Discharge Planning                Permission sought to share information with:  Chartered certified accountant granted to share information::  Yes, Verbal Permission Granted  Name::        Agency::  [facility]  Relationship::     Contact Information:     Housing/Transportation Living arrangements for the past 2 months:  [place] of Information:  Patient, Medical Team, Facility Patient Interpreter Needed:  None Criminal Activity/Legal Involvement Pertinent to Current Situation/Hospitalization:  No - Comment as needed Significant Relationships:  Adult Children, Warehouse manager Lives with:  Self Do you feel safe going back to the place where you live?  Yes Need for family participation in patient care:  No (Coment)  Care giving concerns: PT recommendation for HHPT; patient wants to admit to [facility] for respite   Social Worker assessment / plan:  CSW met with the patient at bedside to discuss discharge planning. The patient indicated that she does not feel safe to return home without family support. CSW contacted [facility] to discuss the patient's options; Crystal confirmed that the patient can discharge today to room 333 for respite. CSW has delivered the packet for discharge, and the patient has chosen EMS for transport. CSW is signing off. Please consult should other needs arise.  Employment status:  Retired Forensic scientist:  Medicare PT Recommendations:  Home with [place] / Referral to community resources:  [place]  Patient/Family's Response to care:  Patient thanked CSW for assistance  Patient/Family's Understanding of and Emotional Response to Diagnosis, Current Treatment, and Prognosis:  The patient understands that  she is discharging under respite care and will not receive PT at the facility.   Emotional Assessment Appearance:  Appears younger than stated age Attitude/Demeanor/Rapport:  Apprehensive(Alert and pleasant) Affect (typically observed):  Apprehensive, Pleasant Orientation:  Oriented to Self, Oriented to Place, Oriented to  Time, Oriented to Situation Alcohol / Substance use:  Never Used Psych involvement (Current and /or in the community):  No (Comment)  Discharge Needs  Concerns to be addressed:  Care Coordination, Discharge Planning Concerns Readmission within the last 30 days:  No Current discharge risk:  Lives alone Barriers to Discharge:  No Barriers Identified   Zettie Pho, LCSW 06/01/2017, 3:16 PM

## 2017-06-01 NOTE — Consult Note (Signed)
[facility]  CARDIOLOGY CONSULT NOTE  Patient ID: Bridget Mendez MRN: [id number] DOB/AGE: 1919-08-09 83 y.o.  Admit date: 05/31/2017 Referring Physician Dr. Margaretmary Eddy Primary Physician Dr. Silvio Pate Primary Cardiologist   Reason for Consultation afib  HPI: Patient is a 83 year old female with no prior cardiac problems.  Lives in an assisted living environment and is very independent.  Over the last several weeks has noted increasing weakness and fatigue.  She was advised to go to the emergency room where she was evaluated and noted to be in atrial fibrillation with controlled ventricular response.  She was hemodynamically stable.  Chest x-ray revealed no pulmonary edema.  There was minimal left lower lobe atelectasis.  She has a mildly elevated serum troponin which has not varied frequently.  He has no chest pain.  She was noted to be mildly volume depleted.  Her serum creatinine was increased to 1.13 with a baseline of less than 1.  Her BUN and was mildly elevated.  Her electrolytes otherwise were unremarkable.  She feels back to her baseline after IV fluid hydration.  She denies any dizziness.  She is able to ambulate from bed to the bathroom without difficulty.  Her chads 2 vasc score is 3.  Two for age greater than 35 and 1 for female gender.  Review of Systems  HENT: Negative.   Eyes: Negative.   Respiratory: Positive for shortness of breath.   Cardiovascular: Negative.   Gastrointestinal: Negative.   Genitourinary: Negative.   Musculoskeletal: Negative.   Skin: Negative.   Neurological: Positive for weakness.  Endo/Heme/Allergies: Negative.   Psychiatric/Behavioral: Negative.     Past Medical History:  Diagnosis Date  . Allergy   . Anxiety   . Depression   . GERD (gastroesophageal reflux disease)   . Osteoarthritis   . Osteoporosis     Family History  Problem Relation Age of Onset  . Parkinsonism Mother   . Cancer Maternal  Grandmother     Social History   Socioeconomic History  . Marital status: Married    Spouse name: Not on file  . Number of children: 2  . Years of education: Not on file  . Highest education level: Not on file  Social Needs  . Financial resource strain: Not on file  . Food insecurity - worry: Not on file  . Food insecurity - inability: Not on file  . Transportation needs - medical: Not on file  . Transportation needs - non-medical: Not on file  Occupational History  . Occupation: retired  Tobacco Use  . Smoking status: Former Smoker    Packs/day: 1.00    Types: Cigarettes    Last attempt to quit: 07/11/1988    Years since quitting: 28.9  . Smokeless tobacco: Never Used  Substance and Sexual Activity  . Alcohol use: Yes    Comment: 2 drinks in the evening  . Drug use: No  . Sexual activity: Not on file  Other Topics Concern  . Not on file  Social History Narrative   Has living will   Daughter, [name], is health care POA--- son [name] is alternate   Has DNR already--reviewed   No tube feeds if cognitively aware       Past Surgical History:  Procedure Laterality Date  . CESAREAN SECTION     x2  . HEMORRHOID SURGERY    . INGUINAL HERNIA REPAIR  02/2009   Dr.Wilton Tamala Julian  . KNEE ARTHROSCOPY  1998 &  2005   both knees  . TOTAL HIP ARTHROPLASTY Right 07/29/2014   Procedure: RIGHT TOTAL HIP ARTHROPLASTY ANTERIOR APPROACH;  Surgeon: Hessie Dibble, MD;  Location: [facility];  Service: Orthopedics;  Laterality: Right;     Medications Prior to Admission  Medication Sig Dispense Refill Last Dose  . ALPRAZolam (XANAX) 0.5 MG tablet take 1/2 to 1 tablet by mouth twice a day if needed for anxiety 60 tablet 0 05/31/2017 at 0800  . aspirin 81 MG tablet Take 81 mg by mouth daily.   05/31/2017 at 0800  . Multiple Vitamin (MULTIVITAMIN) tablet Take 2 tablets by mouth daily.    05/31/2017 at 0800  . Probiotic Product (PROBIOTIC DAILY) CAPS Take 1 capsule by mouth daily.    05/31/2017 at  0800  . acetaminophen (TYLENOL) 650 MG CR tablet Take 650 mg by mouth 2 (two) times daily.   prn at prn  . calcium carbonate (TUMS EX) 750 MG chewable tablet Chew 1-2 tablets by mouth daily.   prn at prn  . tiZANidine (ZANAFLEX) 2 MG tablet take 1 tablet by mouth every 8 hours if needed for muscle spasm 60 tablet 0 prn at prn    Physical Exam: Blood pressure (!) 142/76, pulse 89, temperature 97.6 F (36.4 C), temperature source Oral, resp. rate 20, height 5\' 3"  (1.6 m), weight 72.6 kg (160 lb), SpO2 94 %.   Wt Readings from Last 1 Encounters:  05/31/17 72.6 kg (160 lb)     General appearance: alert and cooperative Head: Normocephalic, without obvious abnormality, atraumatic Resp: clear to auscultation bilaterally Cardio: irregularly irregular rhythm GI: soft, non-tender; bowel sounds normal; no masses,  no organomegaly Extremities: extremities normal, atraumatic, no cyanosis or edema Pulses: 2+ and symmetric Neurologic: Grossly normal  Labs:   Lab Results  Component Value Date   WBC 5.2 06/01/2017   HGB 12.0 06/01/2017   HCT 35.8 06/01/2017   MCV 99.0 06/01/2017   PLT 301 06/01/2017    Recent Labs  Lab 06/01/17 0134  NA 139  K 4.1  CL 110  CO2 21*  BUN 24*  CREATININE 0.97  CALCIUM 8.4*  GLUCOSE 99   Lab Results  Component Value Date   TROPONINI 0.07 ([place]) 06/01/2017      Radiology: No acute cardiopulmonary abnormality EKG: Atrial fibrillation controlled ventricular response  ASSESSMENT AND PLAN: 83 year old female admitted with what appears to be new onset atrial fibrillation.  Likely exacerbated by relative volume depletion.  She is hemodynamically stable.  Her chads score is 3.  Given her advanced age, bleeding risk is somewhat elevated.  Chronic anticoagulation will be deferred at present however will need to be discussed as an outpatient.  Would ambulate today on current regimen and if stable consider discharge back to her assisted living environment.  We will  be happy to follow as an outpatient. Signed: Teodoro Spray MD, [facility] 06/01/2017, 9:52 AM

## 2017-06-01 NOTE — Telephone Encounter (Signed)
She was admitted Will follow her course at [facility]

## 2017-06-01 NOTE — Evaluation (Signed)
Physical Therapy Evaluation Patient Details Name: Bridget Mendez MRN: [id number] DOB: 10/28/1919 Today's Date: 06/01/2017   History of Present Illness  PAtient is a pleasant 83 y/o female that presents with generalized weakness, found to have a-fib with RVR in the ED and was admitted for workup, has since been re-hydrated with IV fluids.   Clinical Impression  Patient presents from home after feeling weak, found to have a-fib. She is normally modified independent with 4WW and use of SPC at times living at [facility]. Her HR fluctuated between 107-117 bpm throughout this session, no chest pain or dyspnea with exertion noted. She appears much closer to her baseline, and appears to have improved substantially from the weakness she was describing she was feeling at home yesterday. She appears appropriate to return home with HHPT for conditioning when appropriate medically.     Follow Up Recommendations Home health PT    Equipment Recommendations       Recommendations for Other Services       Precautions / Restrictions Precautions Precautions: Fall Restrictions Weight Bearing Restrictions: No      Mobility  Bed Mobility Overal bed mobility: Independent             General bed mobility comments: No deficits observed in bed mobility.   Transfers Overall transfer level: Modified independent Equipment used: Rolling walker (2 wheeled)             General transfer comment: Patient is able to complete sit to stand transfer with no assistance from therapist, no loss of balance.   Ambulation/Gait Ambulation/Gait assistance: Modified independent (Device/Increase time) Ambulation Distance (Feet): 360 Feet Assistive device: Rolling walker (2 wheeled) Gait Pattern/deviations: WFL(Within Functional Limits)   Gait velocity interpretation: at or above normal speed for age/gender General Gait Details: Patient is able to ambulate around RN station x 2 with RW and no loss of  balance, her HR fluctuated between 107-117 bpm, and she denied having chest pain. She appears much stronger than she reports before this admission.   Stairs            Wheelchair Mobility    Modified Rankin (Stroke Patients Only)       Balance Overall balance assessment: Modified Independent                                           Pertinent Vitals/Pain Pain Assessment: No/denies pain    Home Living Family/patient expects to be discharged to:: Private residence Living Arrangements: Alone Available Help at Discharge: Available PRN/intermittently   Home Access: Level entry     Home Layout: One level Home Equipment: Cane - single point;Walker - 4 wheels Additional Comments: Patient uses RW in the apartment and a SPC in the community, plans to return to [facility] for rehab.    Prior Function Level of Independence: Independent with assistive device(s)         Comments: Uses 4WW at baseline     Hand Dominance        Extremity/Trunk Assessment   Upper Extremity Assessment Upper Extremity Assessment: Overall WFL for tasks assessed    Lower Extremity Assessment Lower Extremity Assessment: Overall WFL for tasks assessed       Communication   Communication: HOH  Cognition Arousal/Alertness: Awake/alert Behavior During Therapy: WFL for tasks assessed/performed Overall Cognitive Status: Within Functional Limits for tasks assessed  General Comments: Mild memory deficits, but exceptional insight for her age.       General Comments      Exercises     Assessment/Plan    PT Assessment Patient needs continued PT services  PT Problem List Decreased strength;Decreased activity tolerance;Decreased balance;Cardiopulmonary status limiting activity       PT Treatment Interventions DME instruction;Therapeutic activities;Therapeutic exercise;Gait training;Stair training;Balance  training;Neuromuscular re-education    PT Goals (Current goals can be found in the Care Plan section)  Acute Rehab PT Goals Patient Stated Goal: To return home safely  PT Goal Formulation: With patient Time For Goal Achievement: 06/15/17 Potential to Achieve Goals: Good    Frequency Min 2X/week   Barriers to discharge        Co-evaluation               AM-PAC PT "6 Clicks" Daily Activity  Outcome Measure Difficulty turning over in bed (including adjusting bedclothes, sheets and blankets)?: None Difficulty moving from lying on back to sitting on the side of the bed? : None Difficulty sitting down on and standing up from a chair with arms (e.g., wheelchair, bedside commode, etc,.)?: None Help needed moving to and from a bed to chair (including a wheelchair)?: None Help needed walking in hospital room?: None Help needed climbing 3-5 steps with a railing? : A Little 6 Click Score: 23    End of Session Equipment Utilized During Treatment: Gait belt Activity Tolerance: Patient tolerated treatment well Patient left: in bed;with bed alarm set;with call bell/phone within reach Nurse Communication: Mobility status PT Visit Diagnosis: Muscle weakness (generalized) (M62.81)    Time: 0938-1829 PT Time Calculation (min) (ACUTE ONLY): 17 min   Charges:   PT Evaluation $PT Eval Moderate Complexity: 1 Mod     PT G Codes:   PT G-Codes **NOT FOR INPATIENT CLASS** Functional Assessment Tool Used: AM-PAC 6 Clicks Basic Mobility Functional Limitation: Mobility: Walking and moving around Mobility: Walking and Moving Around Current Status (H3716): At least 1 percent but less than 20 percent impaired, limited or restricted Mobility: Walking and Moving Around Goal Status [phone]): At least 1 percent but less than 20 percent impaired, limited or restricted    Royce Macadamia PT, DPT, CSCS    06/01/2017, 12:51 PM

## 2017-06-05 ENCOUNTER — Inpatient Hospital Stay: Payer: Medicare Other | Admitting: Internal Medicine

## 2017-06-06 ENCOUNTER — Ambulatory Visit (INDEPENDENT_AMBULATORY_CARE_PROVIDER_SITE_OTHER): Payer: Medicare Other | Admitting: Internal Medicine

## 2017-06-06 ENCOUNTER — Encounter: Payer: Self-pay | Admitting: Internal Medicine

## 2017-06-06 VITALS — BP 138/68 | HR 79 | Temp 97.9°F | Wt 172.0 lb

## 2017-06-06 DIAGNOSIS — I4819 Other persistent atrial fibrillation: Secondary | ICD-10-CM

## 2017-06-06 DIAGNOSIS — I481 Persistent atrial fibrillation: Secondary | ICD-10-CM

## 2017-06-06 DIAGNOSIS — I4891 Unspecified atrial fibrillation: Secondary | ICD-10-CM | POA: Insufficient documentation

## 2017-06-06 NOTE — Assessment & Plan Note (Signed)
New diagnosis No meds--may benefit from low dose chronotropic agent (though has had orthostasis in past) High functioning but she is 95 NOAC is a hard decision--but I might lean toward using it (if she agrees) Will set up with cardiologist to review  Counseled on the uncertainty of both these potential treatments

## 2017-06-06 NOTE — Progress Notes (Signed)
Subjective:    Patient ID: Bridget Mendez, female    DOB: July 02, 1920, 83 y.o.   MRN: [id number]  HPI Here for hospital follow up  Had a slight bad feeling in [place] [facility] about a month ago Had to sit down and got better Only lasted a few seconds  7 days  ago Had a busy day--then later felt very weak at home Barely could walk around and easy DOE No chest pain but felt her heart beating hard Went to bed--able to sleep  Woke and didn't feel better Couldn't get in anywhere and was instructed to go to ER Called rescue  Found to be in atrial fibrillation Slight troponin elevation Seen by Dr Ubaldo Glassing The ER EKG's don't show atrial fib though Echo was okay Sent home on no new medications--just on ASA  No current palpitations No SOB--but limited exercise tolerance (but tries to push herself) No chest pain  Current Outpatient Medications on File Prior to Visit  Medication Sig Dispense Refill  . acetaminophen (TYLENOL) 650 MG CR tablet Take 650 mg by mouth 2 (two) times daily.    Marland Kitchen ALPRAZolam (XANAX) 0.5 MG tablet take 1/2 to 1 tablet by mouth twice a day if needed for anxiety 15 tablet 0  . aspirin 81 MG tablet Take 81 mg by mouth daily.    . calcium carbonate (TUMS EX) 750 MG chewable tablet Chew 1-2 tablets by mouth daily.    . Multiple Vitamin (MULTIVITAMIN) tablet Take 2 tablets by mouth daily.     . Probiotic Product (PROBIOTIC DAILY) CAPS Take 1 capsule by mouth daily.     Marland Kitchen tiZANidine (ZANAFLEX) 2 MG tablet take 1 tablet by mouth every 8 hours if needed for muscle spasm 60 tablet 0  . [START ON 06/08/2017] Vitamin D, Ergocalciferol, (DRISDOL) 50000 units CAPS capsule Take 1 capsule (50,000 Units total) by mouth every 7 (seven) days. 12 capsule 0   No current facility-administered medications on file prior to visit.     No Known Allergies  Past Medical History:  Diagnosis Date  . Allergy   . Anxiety   . Depression   . GERD (gastroesophageal reflux disease)   .  Osteoarthritis   . Osteoporosis     Past Surgical History:  Procedure Laterality Date  . CESAREAN SECTION     x2  . HEMORRHOID SURGERY    . INGUINAL HERNIA REPAIR  02/2009   Dr.Wilton Tamala Julian  . KNEE ARTHROSCOPY  1998 & 2005   both knees  . TOTAL HIP ARTHROPLASTY Right 07/29/2014   Procedure: RIGHT TOTAL HIP ARTHROPLASTY ANTERIOR APPROACH;  Surgeon: Hessie Dibble, MD;  Location: [place];  Service: Orthopedics;  Laterality: Right;    Family History  Problem Relation Age of Onset  . Parkinsonism Mother   . Cancer Maternal Grandmother     Social History   Socioeconomic History  . Marital status: Married    Spouse name: Not on file  . Number of children: 2  . Years of education: Not on file  . Highest education level: Not on file  Social Needs  . Financial resource strain: Not on file  . Food insecurity - worry: Not on file  . Food insecurity - inability: Not on file  . Transportation needs - medical: Not on file  . Transportation needs - non-medical: Not on file  Occupational History  . Occupation: retired  Tobacco Use  . Smoking status: Former Smoker    Packs/day: 1.00  Types: Cigarettes    Last attempt to quit: 07/11/1988    Years since quitting: 28.9  . Smokeless tobacco: Never Used  Substance and Sexual Activity  . Alcohol use: Yes    Comment: 2 drinks in the evening  . Drug use: No  . Sexual activity: Not on file  Other Topics Concern  . Not on file  Social History Narrative   Has living will   Daughter, [name], is health care POA--- son [name] is alternate   Has DNR already--reviewed   No tube feeds if cognitively aware      Review of Systems Now getting meals from the [facility] Put her name on the assisted living list Appetite is okay Sleeping fine    Objective:   Physical Exam  Constitutional: She appears well-developed. No distress.  Neck: No thyromegaly present.  Cardiovascular: Normal rate. Exam reveals no gallop.  Irregular ?slight aortic  systolic murmur  Pulmonary/Chest: Effort normal and breath sounds normal. No respiratory distress. She has no wheezes. She has no rales.  Musculoskeletal:  Calves full without pitting  Lymphadenopathy:    She has no cervical adenopathy.  Psychiatric:  Usual mild anxiety--slightly pressured speech          Assessment & Plan:

## 2017-06-07 ENCOUNTER — Telehealth: Payer: Self-pay

## 2017-06-07 NOTE — Telephone Encounter (Signed)
Spoke to pt and advised per Dr Silvio Pate

## 2017-06-07 NOTE — Telephone Encounter (Signed)
Please let her know she doesn't need to restrict herself other than with any symptoms (like if she gets SOB rushing around) I will contact the cardiologist and see if they can get her in sooner

## 2017-06-07 NOTE — Telephone Encounter (Signed)
Copied from [place] [id number]. Topic: Inquiry >> Jun 07, 2017  9:53 AM Conception Chancy, NT wrote: Reason for CRM: pt states her first date for cardiologist is 07/18/17 and she wants to know if that is okay, if there is any do and donts for her. Pt would like a call back.

## 2017-06-08 NOTE — Progress Notes (Signed)
Cardiology Office Note  Date:  06/09/2017   ID:  Bridget Mendez, DOB [birth year], MRN [id number]  PCP:  Venia Carbon, MD   Chief Complaint  Patient presents with  . other    Ref by Dr. Silvio Pate for arrhythmia.  meds reviewed by the pt. verbally. Pt. c/o shortness of breath and chest pain.     HPI:  Patient is a 83 year old female with past medical history of  Smoking hx stopped 20 years ago,  Anxiety Who presents by referral from Dr. Silvio Pate for symptoms of weakness, fatigue, reports indicating atrial fibrillation  She reports that at the beginning of the month she had episode of weakness and fatigue Etiology unclear had to sit down to recover,  Was doing fine until May 31, 2017 she presented to the emergency room Reported having dizziness, weakness. Felt like she was going to pass out/near syncope Hospital records reviewed with the patient in detail   Diagnosed with dehydration Seen by outside cardiology, told she had atrial fibrillation  EKG from the hospital personally reviewed by myself showing normal sinus rhythm With first-degree AV block, PVCs   cardiovascular strips from inpatient stay reviewed by myself showing normal sinus rhythm withFirst-degree AV block, ectopy/PVCs  EKG on today's visit showing normal sinus rhythm with first-degree AV block, PVCs , intraventricular conduction delay/left bundle branch block  In the hospital x-ray revealed no pulmonary edema.  Minimal elevation of non-trending troponin peak 0.09   PMH:   has a past medical history of Allergy, Anxiety, Depression, GERD (gastroesophageal reflux disease), Osteoarthritis, and Osteoporosis.  PSH:    Past Surgical History:  Procedure Laterality Date  . CESAREAN SECTION     x2  . HEMORRHOID SURGERY    . INGUINAL HERNIA REPAIR  02/2009   Dr.Wilton Tamala Julian  . KNEE ARTHROSCOPY  1998 & 2005   both knees  . TOTAL HIP ARTHROPLASTY Right 07/29/2014   Procedure: RIGHT TOTAL HIP ARTHROPLASTY  ANTERIOR APPROACH;  Surgeon: Hessie Dibble, MD;  Location: [facility];  Service: Orthopedics;  Laterality: Right;    Current Outpatient Medications  Medication Sig Dispense Refill  . acetaminophen (TYLENOL) 650 MG CR tablet Take 650 mg by mouth 2 (two) times daily.    Marland Kitchen ALPRAZolam (XANAX) 0.5 MG tablet take 1/2 to 1 tablet by mouth twice a day if needed for anxiety 15 tablet 0  . aspirin 81 MG tablet Take 81 mg by mouth daily.    . calcium carbonate (TUMS EX) 750 MG chewable tablet Chew 1-2 tablets by mouth daily.    . Multiple Vitamin (MULTIVITAMIN) tablet Take 2 tablets by mouth daily.     . Probiotic Product (PROBIOTIC DAILY) CAPS Take 1 capsule by mouth daily.     Marland Kitchen tiZANidine (ZANAFLEX) 2 MG tablet take 1 tablet by mouth every 8 hours if needed for muscle spasm 60 tablet 0  . Vitamin D, Ergocalciferol, (DRISDOL) 50000 units CAPS capsule Take 1 capsule (50,000 Units total) by mouth every 7 (seven) days. 12 capsule 0  . metoprolol succinate (TOPROL-XL) 25 MG 24 hr tablet Take 1 tablet (25 mg total) by mouth daily. Take with or immediately following a meal. 30 tablet 6   No current facility-administered medications for this visit.      Allergies:   Patient has no known allergies.   Social History:  The patient  reports that she quit smoking about 28 years ago. Her smoking use included cigarettes. She smoked 1.00 pack per day. she has never  used smokeless tobacco. She reports that she drinks alcohol. She reports that she does not use drugs.   Family History:   family history includes Cancer in her maternal grandmother; Parkinsonism in her mother.    Review of Systems: Review of Systems  Constitutional: Negative.   Respiratory: Negative.   Cardiovascular: Positive for palpitations.  Gastrointestinal: Negative.   Musculoskeletal: Negative.   Neurological: Negative.   Psychiatric/Behavioral: The patient is nervous/anxious.   All other systems reviewed and are negative.    PHYSICAL  EXAM: VS:  BP 138/70 (BP Location: Right Arm, Patient Position: Sitting, Cuff Size: Normal)   Pulse 78   Ht 5\' 3"  (1.6 m)   Wt 170 lb 8 oz (77.3 kg)   BMI 30.20 kg/m  , BMI Body mass index is 30.2 kg/m. GEN: Well nourished, well developed, in no acute distress  HEENT: normal  Neck: no JVD, carotid bruits, or masses Cardiac: RRR; ectopy appreciated, 1-2/6 SEM RSB, no rubs, or gallops,no edema  Respiratory:  clear to auscultation bilaterally, normal work of breathing GI: soft, nontender, nondistended, + BS MS: no deformity or atrophy  Skin: warm and dry, no rash Neuro:  Strength and sensation are intact Psych: euthymic mood, full affect   Recent Labs: 12/26/2016: ALT 10 05/31/2017: TSH 1.716 06/01/2017: BUN 24; Creatinine, Ser 0.97; Hemoglobin 12.0; Platelets 301; Potassium 4.1; Sodium 139    Lipid Panel No results found for: CHOL, HDL, LDLCALC, TRIG    Wt Readings from Last 3 Encounters:  06/09/17 170 lb 8 oz (77.3 kg)  06/06/17 172 lb (78 kg)  05/31/17 160 lb (72.6 kg)       ASSESSMENT AND PLAN:  Arrhythmia Hospital EKGs reviewed, cardiovascular strips from inpatient stay reviewed EKG from today's visit reviewed  no evidence of atrial fibrillation She is having normal sinus rhythm,  first-degree AV block with PVCs She is not particularly symptomatic from the PVCs but does report elevated heart rate at times Suspect this could be secondary to anxiety we have recommended she start metoprolol succinate 12.5 mg daily in the morning.  After 1 week if no improvement of her symptoms would recommend she increase up to 25 mg daily She does not need anticoagulation Acceptable risk to stay on low-dose aspirin as she is taking  Orthostatic hypotension - Plan: EKG 12-Lead Blood pressure stable on today's visit Denies lightheadedness on standing concerning for orthostasis  Weakness Etiology of her weakness is unclear Recent echocardiogram is normal  PVC (premature  ventricular contraction) Long discussion concerning her PVCs She is asymptomatic but does appreciate the ectopy when she palpates her radial pulse We will start low-dose metoprolol   First degree AV block We will continue to monitor with periodic EKGs No intervention needed at this time  Intraventricular conduction delay Interventricular conduction delay, possible left bundle branch block Normal LV function, no anginal symptoms Given her age, no ischemic workup at this time  Disposition:   F/U   as needed   Total encounter time more than 60 minutes  Greater than 50% was spent in counseling and coordination of care with the patient   Orders Placed This Encounter  Procedures  . EKG 12-Lead     Signed, Esmond Plants, M.D., Ph.D. 06/09/2017  [place], [place]

## 2017-06-09 ENCOUNTER — Encounter: Payer: Self-pay | Admitting: Cardiovascular Disease

## 2017-06-09 ENCOUNTER — Ambulatory Visit (INDEPENDENT_AMBULATORY_CARE_PROVIDER_SITE_OTHER): Payer: Medicare Other | Admitting: Cardiovascular Disease

## 2017-06-09 VITALS — BP 138/70 | HR 78 | Ht 63.0 in | Wt 170.5 lb

## 2017-06-09 DIAGNOSIS — I951 Orthostatic hypotension: Secondary | ICD-10-CM | POA: Diagnosis not present

## 2017-06-09 DIAGNOSIS — I440 Atrioventricular block, first degree: Secondary | ICD-10-CM

## 2017-06-09 DIAGNOSIS — I493 Ventricular premature depolarization: Secondary | ICD-10-CM

## 2017-06-09 DIAGNOSIS — I499 Cardiac arrhythmia, unspecified: Secondary | ICD-10-CM | POA: Diagnosis not present

## 2017-06-09 DIAGNOSIS — R531 Weakness: Secondary | ICD-10-CM

## 2017-06-09 DIAGNOSIS — I481 Persistent atrial fibrillation: Secondary | ICD-10-CM | POA: Diagnosis not present

## 2017-06-09 DIAGNOSIS — I459 Conduction disorder, unspecified: Secondary | ICD-10-CM | POA: Diagnosis not present

## 2017-06-09 DIAGNOSIS — I4819 Other persistent atrial fibrillation: Secondary | ICD-10-CM

## 2017-06-09 MED ORDER — METOPROLOL SUCCINATE ER 25 MG PO TB24: 25.0000 mg | ORAL_TABLET | Freq: Every day | ORAL | 6 refills | Status: DC

## 2017-06-09 NOTE — Patient Instructions (Addendum)
You are having PVCs Premature Ventricular Contractions   Medication Instructions:   Please start metoprolol 1/2 pill once a day in the morning Wait a week If you continue to have rapid beats, tachycardia, Increase the metoprolol up to a full pill  Labwork:  No new labs needed  Testing/Procedures:  No further testing at this time   Follow-Up: It was a pleasure seeing you in the office today. Please call us if you have new issues that need to be addressed before your next appt.  [phone]  Your physician wants you to follow-up in: As needed   If you need a refill on your cardiac medications before your next appointment, please call your pharmacy.

## 2017-06-12 ENCOUNTER — Telehealth: Payer: Self-pay | Admitting: *Deleted

## 2017-06-12 NOTE — Telephone Encounter (Signed)
Copied from [place] [id number]. Topic: Inquiry >> Jun 07, 2017  9:53 AM Conception Chancy, NT wrote: Reason for CRM: pt states her first date for cardiologist is 07/18/17 and she wants to know if that is okay, if there is any do and donts for her. Pt would like a call back.   >> Jun 12, 2017 11:50 AM Yvette Rack wrote: Pt want to thank Dr Silvio Pate for making her colonoscopy with Dr Armandina Gemma

## 2017-06-12 NOTE — Telephone Encounter (Signed)
It was the cardiology visit with Dr Eliane Decree he saw her so soon since didn't have atrial fib

## 2017-06-13 NOTE — Telephone Encounter (Signed)
Her question was related to what she should do until her appointment with the cardiologist because the appt was orignially in January.. Looks like she saw him on 06-09-17.

## 2017-07-05 ENCOUNTER — Other Ambulatory Visit: Payer: Self-pay | Admitting: Internal Medicine

## 2017-07-05 NOTE — Telephone Encounter (Signed)
Last filled 05-05-17 #60 Last OV 06-06-17 Next OV 01-02-18

## 2017-07-06 NOTE — Telephone Encounter (Signed)
Approved: #60 x 0 

## 2017-07-06 NOTE — Telephone Encounter (Signed)
Left refill on voice mail at pharmacy  

## 2017-07-17 ENCOUNTER — Telehealth: Payer: Self-pay | Admitting: Cardiovascular Disease

## 2017-07-17 NOTE — Telephone Encounter (Signed)
Patient has had two episodes this weekend with the extra heartbeat Has taken a whole pill instead of half of metoprolol like Dr Rockey Situ suggested but since the episodes were so close together she would like to speak with someone She does state that she is under a lot of strain right now due to her bank account being scammed Please call

## 2017-07-17 NOTE — Telephone Encounter (Signed)
Spoke with patient and she was taking 1/2 tablet metoprolol since last visit and she has had 2 days of the PVC's. She expressed that she is under some stress and thinks this may have also caused it to be more noticeable. She states that after calling she remembered that he had told her to take 1/2 tablet or whole tablet once daily. Instructed her to increase to 1 whole tablet and continue monitoring. Also advised that she could take later in the day if needed to help reduce any fatigue. She was agreeable with this plan and had no further questions at this time.

## 2017-07-18 ENCOUNTER — Ambulatory Visit: Payer: Medicare Other | Admitting: Cardiovascular Disease

## 2017-07-21 ENCOUNTER — Telehealth: Payer: Self-pay | Admitting: Cardiovascular Disease

## 2017-07-21 NOTE — Telephone Encounter (Signed)
Spoke with patient and she reports that she has been having increased PVCs and it is causing her to have "weak spells" which are making her tired. She states that she is going to see her PCP on Monday but also wanted to check with [place] to see if we had any other recommendations such as another monitor. She states that Dr. Rockey Situ mentioned a "Butterly size" monitor. Reviewed last office visit note and advised patient that I would check with Dr. Rockey Situ and then be in touch with her later today or on Monday with his recommendations. She verbalized understanding and had no further questions at this time.

## 2017-07-21 NOTE — Telephone Encounter (Signed)
Pt calling stating she is having a weakness spells She wants to know more about the dx she was given  PVC  She states an example would be her getting out of the shower and just be very tired.  She is stating she was okay, up until Nov when she saw [place]  Would like a call back   Please advise

## 2017-07-22 NOTE — Telephone Encounter (Signed)
She can certainly wear zio monitor if she is having symptoms/ectopy, weakness

## 2017-07-24 ENCOUNTER — Encounter: Payer: Self-pay | Admitting: Internal Medicine

## 2017-07-24 ENCOUNTER — Ambulatory Visit (INDEPENDENT_AMBULATORY_CARE_PROVIDER_SITE_OTHER): Payer: Medicare Other | Admitting: Internal Medicine

## 2017-07-24 VITALS — BP 112/80 | HR 99 | Temp 97.5°F | Wt 169.0 lb

## 2017-07-24 DIAGNOSIS — I493 Ventricular premature depolarization: Secondary | ICD-10-CM | POA: Diagnosis not present

## 2017-07-24 NOTE — Telephone Encounter (Signed)
Spoke with patient and she states that she is seeing Dr. Silvio Pate today and would prefer to wait for a few days before moving forward with any monitor. Reviewed that we could place a 2 week monitor to see how frequent she is having these PVCs and symptoms. She verbalized understanding but stated that she would like to wait and call me back if she should need to try the monitor. She had no further questions or concerns at this time and stated that she would give [place] a call back when she is ready for monitor.

## 2017-07-24 NOTE — Assessment & Plan Note (Signed)
No extra beats while I was listening Lots of stress now--hopefully will be settling down May be better with the full metoprolol tab She is going to consider the 2 week monitor though
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
## 2017-08-23 DIAGNOSIS — H40003 Preglaucoma, unspecified, bilateral: Secondary | ICD-10-CM | POA: Diagnosis not present

## 2017-08-26 ENCOUNTER — Other Ambulatory Visit: Payer: Self-pay | Admitting: Internal Medicine

## 2017-09-03 ENCOUNTER — Other Ambulatory Visit: Payer: Self-pay | Admitting: Internal Medicine

## 2017-09-04 ENCOUNTER — Other Ambulatory Visit: Payer: Self-pay | Admitting: Internal Medicine

## 2017-09-04 MED ORDER — ALPRAZOLAM 0.5 MG PO TABS: ORAL_TABLET | ORAL | 0 refills | Status: DC

## 2017-09-04 NOTE — Telephone Encounter (Signed)
Copied from [place] [id number]. Topic: Quick Communication - Rx Refill/Question >> Sep 04, 2017  1:09 PM Arletha Grippe wrote: Medication: ALPRAZolam [name]) 0.5 MG tablet  Pharm needs a new rx since pharm changed names      Has the patient contacted their pharmacy? Yes.     (Agent: If no, request that the patient contact the pharmacy for the refill.)   Preferred Pharmacy (with phone number or street name): [facility] [address]    Agent: Please be advised that RX refills may take up to 3 business days. We ask that you follow-up with your pharmacy.

## 2017-09-04 NOTE — Telephone Encounter (Signed)
Last refill 07/06/17  #60

## 2017-09-04 NOTE — Telephone Encounter (Signed)
Pharmacy needs a new rx for Xanax since pharmacy changed names.  LOV: 12/26/16  Dr. Viviana Simpler  Pharmacy: [facility] [address]

## 2017-10-04 ENCOUNTER — Encounter: Payer: Self-pay | Admitting: Internal Medicine

## 2017-10-04 ENCOUNTER — Ambulatory Visit (INDEPENDENT_AMBULATORY_CARE_PROVIDER_SITE_OTHER): Payer: Medicare Other | Admitting: Internal Medicine

## 2017-10-04 VITALS — BP 118/82 | HR 87 | Temp 97.7°F | Ht 63.0 in | Wt 167.0 lb

## 2017-10-04 DIAGNOSIS — M542 Cervicalgia: Secondary | ICD-10-CM | POA: Diagnosis not present

## 2017-10-04 NOTE — Progress Notes (Signed)
Subjective:    Patient ID: Bridget Mendez, female    DOB: [birth year], 84 y.o.   MRN: [id number]  HPI Here due to neck pain Goes back to the fall Muscle relaxer didn't help but just made her sleepy  Does does stretching exercises--wonders if that could be worsening (does do core work that may be straining the neck) Still goes to exercise class also Pain is fairly constant---and she can hear cracking Some trouble in bed---has to adjust position regularly  Some stiffness No meds for this No radiation down arms No arm weakness Heat and cold not clearly helpful  Current Outpatient Medications on File Prior to Visit  Medication Sig Dispense Refill  . acetaminophen (TYLENOL) 650 MG CR tablet Take 650 mg by mouth 2 (two) times daily.    Marland Kitchen ALPRAZolam (XANAX) 0.5 MG tablet take 1/2-1 tablet by mouth twice a day if needed for anxiety 60 tablet 0  . aspirin 81 MG tablet Take 81 mg by mouth daily.    . calcium carbonate (TUMS EX) 750 MG chewable tablet Chew 1-2 tablets by mouth daily.    . metoprolol succinate (TOPROL-XL) 25 MG 24 hr tablet Take 1 tablet (25 mg total) by mouth daily. Take with or immediately following a meal. 30 tablet 6  . Multiple Vitamin (MULTIVITAMIN) tablet Take 2 tablets by mouth daily.     . Probiotic Product (PROBIOTIC DAILY) CAPS Take 1 capsule by mouth daily.      No current facility-administered medications on file prior to visit.     No Known Allergies  Past Medical History:  Diagnosis Date  . Allergy   . Anxiety   . Depression   . GERD (gastroesophageal reflux disease)   . Osteoarthritis   . Osteoporosis     Past Surgical History:  Procedure Laterality Date  . CESAREAN SECTION     x2  . HEMORRHOID SURGERY    . INGUINAL HERNIA REPAIR  02/2009   Dr.Wilton Tamala Julian  . KNEE ARTHROSCOPY  1998 & 2005   both knees  . TOTAL HIP ARTHROPLASTY Right 07/29/2014   Procedure: RIGHT TOTAL HIP ARTHROPLASTY ANTERIOR APPROACH;  Surgeon: Hessie Dibble, MD;   Location: [place];  Service: Orthopedics;  Laterality: Right;    Family History  Problem Relation Age of Onset  . Parkinsonism Mother   . Cancer Maternal Grandmother     Social History   Socioeconomic History  . Marital status: Married    Spouse name: Not on file  . Number of children: 2  . Years of education: Not on file  . Highest education level: Not on file  Occupational History  . Occupation: retired  Scientific laboratory technician  . Financial resource strain: Not on file  . Food insecurity:    Worry: Not on file    Inability: Not on file  . Transportation needs:    Medical: Not on file    Non-medical: Not on file  Tobacco Use  . Smoking status: Former Smoker    Packs/day: 1.00    Types: Cigarettes    Last attempt to quit: 07/11/1988    Years since quitting: 29.2  . Smokeless tobacco: Never Used  Substance and Sexual Activity  . Alcohol use: Yes    Comment: 2 drinks in the evening  . Drug use: No  . Sexual activity: Not on file  Lifestyle  . Physical activity:    Days per week: Not on file    Minutes per session: Not on file  .  Stress: Not on file  Relationships  . Social connections:    Talks on phone: Not on file    Gets together: Not on file    Attends religious service: Not on file    Active member of club or organization: Not on file    Attends meetings of clubs or organizations: Not on file    Relationship status: Not on file  . Intimate partner violence:    Fear of current or ex partner: Not on file    Emotionally abused: Not on file    Physically abused: Not on file    Forced sexual activity: Not on file  Other Topics Concern  . Not on file  Social History Narrative   Has living will   Daughter, [name], is health care POA--- son [name] is alternate   Has DNR already--reviewed   No tube feeds if cognitively aware      Review of Systems  No falls or known injuries Walks with cane and uses rollator in house (thinks the height is correct)     Objective:    Physical Exam  Constitutional: No distress.  Neck:  No spine or muscular tenderness Mildly reduced flexion, moderate extension Markedly reduced rotation and tilt  Neurological:  Normal arm strength          Assessment & Plan:

## 2017-10-04 NOTE — Assessment & Plan Note (Signed)
Probably from muscle tightness on top of osteoarthritis meds and heat/ice not helping May be worsening it with some of her stretching Will make referral to PT at [facility] (order faxed)

## 2017-10-10 ENCOUNTER — Telehealth: Payer: Self-pay | Admitting: Internal Medicine

## 2017-10-10 NOTE — Telephone Encounter (Signed)
Will forward to [facility], CMA, to see if she has any updates.  Appears order was to be faxed after last office visit.

## 2017-10-10 NOTE — Telephone Encounter (Signed)
I faxed it to [facility] and sent the order to be scanned in. It is in her chart. She does have an appointment tomorrow.

## 2017-10-10 NOTE — Telephone Encounter (Signed)
Copied from [place] [id number]. Topic: Referral - Question >> Oct 10, 2017  2:51 PM Hewitt Shorts wrote: CRM for notification. See Telephone encounter for: 10/10/17.pt is calling to let the office know that the phyiscal therapy department has not called her yet to schedule at [facility]   Best number [phone]

## 2017-10-11 DIAGNOSIS — M542 Cervicalgia: Secondary | ICD-10-CM | POA: Diagnosis not present

## 2017-10-12 DIAGNOSIS — M542 Cervicalgia: Secondary | ICD-10-CM | POA: Diagnosis not present

## 2017-10-17 DIAGNOSIS — M542 Cervicalgia: Secondary | ICD-10-CM | POA: Diagnosis not present

## 2017-10-18 DIAGNOSIS — M542 Cervicalgia: Secondary | ICD-10-CM | POA: Diagnosis not present

## 2017-10-19 DIAGNOSIS — M542 Cervicalgia: Secondary | ICD-10-CM | POA: Diagnosis not present

## 2017-10-24 DIAGNOSIS — M542 Cervicalgia: Secondary | ICD-10-CM | POA: Diagnosis not present

## 2017-10-25 DIAGNOSIS — M542 Cervicalgia: Secondary | ICD-10-CM | POA: Diagnosis not present

## 2017-10-26 ENCOUNTER — Other Ambulatory Visit: Payer: Self-pay | Admitting: Internal Medicine

## 2017-10-26 DIAGNOSIS — F39 Unspecified mood [affective] disorder: Secondary | ICD-10-CM

## 2017-10-26 NOTE — Telephone Encounter (Addendum)
Last filled 09-04-17 #60 Last OV 10-04-17 Next OV 01-02-18  Forwarding to Allie Bossier in Dr Alla German absence

## 2017-10-27 DIAGNOSIS — M542 Cervicalgia: Secondary | ICD-10-CM | POA: Diagnosis not present

## 2017-10-27 NOTE — Telephone Encounter (Signed)
Refill sent to pharmacy. Medication is being used appropriately. Caution against alcohol use and benzos.

## 2017-10-30 DIAGNOSIS — M542 Cervicalgia: Secondary | ICD-10-CM | POA: Diagnosis not present

## 2017-11-02 DIAGNOSIS — M542 Cervicalgia: Secondary | ICD-10-CM | POA: Diagnosis not present

## 2017-11-03 DIAGNOSIS — M542 Cervicalgia: Secondary | ICD-10-CM | POA: Diagnosis not present

## 2017-11-07 DIAGNOSIS — M542 Cervicalgia: Secondary | ICD-10-CM | POA: Diagnosis not present

## 2017-11-08 DIAGNOSIS — H0012 Chalazion right lower eyelid: Secondary | ICD-10-CM | POA: Diagnosis not present

## 2017-11-08 DIAGNOSIS — M542 Cervicalgia: Secondary | ICD-10-CM | POA: Diagnosis not present

## 2017-11-09 DIAGNOSIS — M542 Cervicalgia: Secondary | ICD-10-CM | POA: Diagnosis not present

## 2017-11-17 ENCOUNTER — Other Ambulatory Visit: Payer: Self-pay | Admitting: Primary Care

## 2017-11-17 ENCOUNTER — Other Ambulatory Visit: Payer: Self-pay | Admitting: Internal Medicine

## 2017-11-17 DIAGNOSIS — F39 Unspecified mood [affective] disorder: Secondary | ICD-10-CM

## 2017-11-20 NOTE — Telephone Encounter (Signed)
Pt called to check on status of refill  

## 2017-11-20 NOTE — Telephone Encounter (Signed)
Ok to refill? Electronically refill request. Last prescribed by Allie Bossier in Dr Alla German absence on 10/27/2017. Last seen on 10/04/2017

## 2017-12-19 ENCOUNTER — Other Ambulatory Visit: Payer: Self-pay | Admitting: *Deleted

## 2017-12-19 MED ORDER — METOPROLOL SUCCINATE ER 25 MG PO TB24: 25.0000 mg | ORAL_TABLET | Freq: Every day | ORAL | 1 refills | Status: DC

## 2018-01-02 ENCOUNTER — Encounter: Payer: Medicare Other | Admitting: Internal Medicine

## 2018-01-10 DIAGNOSIS — M545 Low back pain: Secondary | ICD-10-CM | POA: Diagnosis not present

## 2018-01-10 DIAGNOSIS — R8290 Unspecified abnormal findings in urine: Secondary | ICD-10-CM | POA: Diagnosis not present

## 2018-01-14 ENCOUNTER — Other Ambulatory Visit: Payer: Self-pay | Admitting: Internal Medicine

## 2018-01-14 DIAGNOSIS — F39 Unspecified mood [affective] disorder: Secondary | ICD-10-CM

## 2018-01-15 ENCOUNTER — Encounter: Payer: Self-pay | Admitting: Family Medicine

## 2018-01-15 ENCOUNTER — Ambulatory Visit (INDEPENDENT_AMBULATORY_CARE_PROVIDER_SITE_OTHER): Payer: Medicare Other | Admitting: Family Medicine

## 2018-01-15 VITALS — BP 148/76 | HR 88 | Temp 97.9°F | Ht 63.0 in | Wt 169.0 lb

## 2018-01-15 DIAGNOSIS — S39012A Strain of muscle, fascia and tendon of lower back, initial encounter: Secondary | ICD-10-CM | POA: Diagnosis not present

## 2018-01-15 NOTE — Patient Instructions (Addendum)
Good to see you today  I will put in an order for physical therapy today  Continue your stretches and heat as needed  IF not better in next 5-10 days, please let us know

## 2018-01-15 NOTE — Telephone Encounter (Signed)
Last filled 11-20-17 #60 LAst OV 10-04-17 Next OV 04-10-18  [facility] [address]

## 2018-01-15 NOTE — Progress Notes (Signed)
Subjective:    Patient ID: Bridget Mendez, female    DOB: 03/22/1920, 84 y.o.   MRN: [id number]  HPI This is a 84 yo female who presents today with back pain x 8 days. Seemed to occur out of the blue. No falls or known injury. Pain had gotten worse over first couple of days so she went to Urgent Care ([facility]) on 01/10/2018. Was given tramadol 25 mg and back exercises. Is feeling a little better as of yesterday. Some pain with sleep, rolling on back. No sciatica, numbness, tingling, weakness or loss of bowel/bladder function. She lives at [facility] and requests PT referral.   Past Medical History:  Diagnosis Date  . Allergy   . Anxiety   . Depression   . GERD (gastroesophageal reflux disease)   . Osteoarthritis   . Osteoporosis    Past Surgical History:  Procedure Laterality Date  . CESAREAN SECTION     x2  . HEMORRHOID SURGERY    . INGUINAL HERNIA REPAIR  02/2009   Dr.Wilton Tamala Julian  . KNEE ARTHROSCOPY  1998 & 2005   both knees  . TOTAL HIP ARTHROPLASTY Right 07/29/2014   Procedure: RIGHT TOTAL HIP ARTHROPLASTY ANTERIOR APPROACH;  Surgeon: Hessie Dibble, MD;  Location: [place];  Service: Orthopedics;  Laterality: Right;   Family History  Problem Relation Age of Onset  . Parkinsonism Mother   . Cancer Maternal Grandmother    Social History   Tobacco Use  . Smoking status: Former Smoker    Packs/day: 1.00    Types: Cigarettes    Last attempt to quit: 07/11/1988    Years since quitting: 29.5  . Smokeless tobacco: Never Used  Substance Use Topics  . Alcohol use: Yes    Comment: 2 drinks in the evening  . Drug use: No      Review of Systems Per HPI    Objective:   Physical Exam  Constitutional: She appears well-developed and well-nourished. No distress.  Appears younger than stated age. Ambulating with rollator without difficulty.   HENT:  Head: Normocephalic and atraumatic.  Eyes: Conjunctivae are normal.  Cardiovascular: Normal rate, regular rhythm and  normal heart sounds.  Pulmonary/Chest: Effort normal and breath sounds normal.  Musculoskeletal: She exhibits no edema.       Lumbar back: She exhibits tenderness.  UE/LE with good ROM, strength.  Mild tenderness across low back.   Skin: Skin is warm and dry. She is not diaphoretic.  Psychiatric: She has a normal mood and affect. Her behavior is normal. Judgment and thought content normal.  Vitals reviewed.     BP (!) 148/76 (BP Location: Right Arm, Patient Position: Sitting, Cuff Size: Large)   Pulse 88   Temp 97.9 F (36.6 C) (Oral)   Ht 5\' 3"  (1.6 m)   Wt 169 lb (76.7 kg)   SpO2 95%   BMI 29.94 kg/m  Wt Readings from Last 3 Encounters:  01/15/18 169 lb (76.7 kg)  10/04/17 167 lb (75.8 kg)  07/24/17 169 lb (76.7 kg)      Assessment & Plan:  1. Strain of lumbar region, initial encounter - improved over last week - reviewed patient instructions from [facility]- discussed continuing exercises as tolerated, heat as needed - no worrisome findings on history or physical exam - patient and I agreed that she did not need additional Tramadol - PT order faxed to [phone] - RTC precautions reviewed   Clarene Reamer, FNP-BC  [facility]  01/15/2018 5:43 PM

## 2018-01-17 ENCOUNTER — Telehealth: Payer: Self-pay | Admitting: *Deleted

## 2018-01-17 NOTE — Telephone Encounter (Signed)
Called and spoke with staff at [facility] as well as patient informing her that her appointment is 01/18/18 at 2:00pm.  Understanding verbalized nothing further needed at this time.

## 2018-01-17 NOTE — Telephone Encounter (Signed)
Copied from [place] [id number]. Topic: Referral - Status >> Jan 17, 2018  8:46 AM Mylinda Latina, NT wrote: Reason for CRM: patient called and states that she seen Neoma Laming on Monday and a referral was suppose to be sent to [facility] for PT. She states they do not have her referral. Pateint states she is in desperate needs for PT. She is wanting a call back on the status of that. CB# [phone].

## 2018-01-17 NOTE — Telephone Encounter (Signed)
Please check on this. See faxed order on your desk. I sent 01/15/18.

## 2018-01-18 DIAGNOSIS — M545 Low back pain: Secondary | ICD-10-CM | POA: Diagnosis not present

## 2018-01-19 DIAGNOSIS — M545 Low back pain: Secondary | ICD-10-CM | POA: Diagnosis not present

## 2018-01-22 DIAGNOSIS — M545 Low back pain: Secondary | ICD-10-CM | POA: Diagnosis not present

## 2018-01-25 DIAGNOSIS — M545 Low back pain: Secondary | ICD-10-CM | POA: Diagnosis not present

## 2018-01-29 ENCOUNTER — Other Ambulatory Visit: Payer: Self-pay | Admitting: Internal Medicine

## 2018-01-29 DIAGNOSIS — M545 Low back pain: Secondary | ICD-10-CM | POA: Diagnosis not present

## 2018-01-29 DIAGNOSIS — F39 Unspecified mood [affective] disorder: Secondary | ICD-10-CM

## 2018-01-29 NOTE — Telephone Encounter (Signed)
Name of Medication: Alprazolam Name of Pharmacy: [facility] [place] Last [place] or Written Date and Quantity:  Last Office Visit and Type: 01/15/18 Acute [facility]an Purl Next Office Visit and Type: 04/10/18 CPE [place] Last Controlled Substance Agreement Date: None Last UDS: None

## 2018-01-31 DIAGNOSIS — M545 Low back pain: Secondary | ICD-10-CM | POA: Diagnosis not present

## 2018-02-02 DIAGNOSIS — M545 Low back pain: Secondary | ICD-10-CM | POA: Diagnosis not present

## 2018-02-05 DIAGNOSIS — M545 Low back pain: Secondary | ICD-10-CM | POA: Diagnosis not present

## 2018-02-07 DIAGNOSIS — M545 Low back pain: Secondary | ICD-10-CM | POA: Diagnosis not present

## 2018-02-26 ENCOUNTER — Ambulatory Visit (INDEPENDENT_AMBULATORY_CARE_PROVIDER_SITE_OTHER): Payer: Medicare Other | Admitting: Internal Medicine

## 2018-02-26 ENCOUNTER — Encounter: Payer: Self-pay | Admitting: Internal Medicine

## 2018-02-26 VITALS — BP 148/92 | HR 84 | Temp 97.7°F | Resp 18 | Ht 63.0 in | Wt 152.2 lb

## 2018-02-26 DIAGNOSIS — M545 Low back pain, unspecified: Secondary | ICD-10-CM

## 2018-02-26 NOTE — Progress Notes (Signed)
Subjective:    Patient ID: Bridget Mendez, female    DOB: [birth year], 84 y.o.   MRN: [id number]  HPI Here due to back pain  Neck problems in April--some better but persists  Seen 6 weeks ago for her back Did have PT but still having problems (9 sessions did help) Can't lean over to screw in hose, etc [employer] [place] shopping---trouble while in motorized cart (hard to turn head and reach for things)  Doesn't remember any injury when started ---almost 2 months ago Did go originally to urgent care---got tramadol (didn't think it helped)  Pain is mostly in the lateral low left back Not tender over spine No radiation to legs and no leg weakness  Hasn't used any OTC meds  Using heat, cold  Current Outpatient Medications on File Prior to Visit  Medication Sig Dispense Refill  . acetaminophen (TYLENOL) 650 MG CR tablet Take 650 mg by mouth 2 (two) times daily.    Marland Kitchen ALPRAZolam (XANAX) 0.5 MG tablet TAKE 1/2 TO 1 TABLET BY MOUTH TWICE DAILY AS NEEDED FOR ANXIETY 60 tablet 0  . aspirin 81 MG tablet Take 81 mg by mouth daily.    . calcium carbonate (TUMS EX) 750 MG chewable tablet Chew 1-2 tablets by mouth daily.    . methylcellulose oral powder Take by mouth.    . metoprolol succinate (TOPROL-XL) 25 MG 24 hr tablet Take 1 tablet (25 mg total) by mouth daily. Take with or immediately following a meal. 90 tablet 1  . Multiple Vitamin (MULTIVITAMIN) tablet Take 2 tablets by mouth daily.     . Probiotic Product (PROBIOTIC DAILY) CAPS Take 1 capsule by mouth daily.      No current facility-administered medications on file prior to visit.     No Known Allergies  Past Medical History:  Diagnosis Date  . Allergy   . Anxiety   . Depression   . GERD (gastroesophageal reflux disease)   . Osteoarthritis   . Osteoporosis     Past Surgical History:  Procedure Laterality Date  . CESAREAN SECTION     x2  . HEMORRHOID SURGERY    . INGUINAL HERNIA REPAIR  02/2009   Dr.Wilton Tamala Julian    . KNEE ARTHROSCOPY  1998 & 2005   both knees  . TOTAL HIP ARTHROPLASTY Right 07/29/2014   Procedure: RIGHT TOTAL HIP ARTHROPLASTY ANTERIOR APPROACH;  Surgeon: Hessie Dibble, MD;  Location: [place];  Service: Orthopedics;  Laterality: Right;    Family History  Problem Relation Age of Onset  . Parkinsonism Mother   . Cancer Maternal Grandmother     Social History   Socioeconomic History  . Marital status: Married    Spouse name: Not on file  . Number of children: 2  . Years of education: Not on file  . Highest education level: Not on file  Occupational History  . Occupation: retired  Scientific laboratory technician  . Financial resource strain: Not on file  . Food insecurity:    Worry: Not on file    Inability: Not on file  . Transportation needs:    Medical: Not on file    Non-medical: Not on file  Tobacco Use  . Smoking status: Former Smoker    Packs/day: 1.00    Types: Cigarettes    Last attempt to quit: 07/11/1988    Years since quitting: 29.6  . Smokeless tobacco: Never Used  Substance and Sexual Activity  . Alcohol use: Yes  Comment: 2 drinks in the evening  . Drug use: No  . Sexual activity: Not on file  Lifestyle  . Physical activity:    Days per week: Not on file    Minutes per session: Not on file  . Stress: Not on file  Relationships  . Social connections:    Talks on phone: Not on file    Gets together: Not on file    Attends religious service: Not on file    Active member of club or organization: Not on file    Attends meetings of clubs or organizations: Not on file    Relationship status: Not on file  . Intimate partner violence:    Fear of current or ex partner: Not on file    Emotionally abused: Not on file    Physically abused: Not on file    Forced sexual activity: Not on file  Other Topics Concern  . Not on file  Social History Narrative   Has living will   Daughter, [name], is health care POA--- son [name] is alternate   Has DNR already--reviewed   No  tube feeds if cognitively aware      Review of Systems  No change in bowel or bladder habits Uses depends at night for bladder though No fever and not feeling sick     Objective:   Physical Exam  Musculoskeletal:  Tender near left S-I joint No spine tenderness SLR negative  Neurological:  Antalgic gait No leg weakness           Assessment & Plan:

## 2018-02-26 NOTE — Assessment & Plan Note (Signed)
Recurrent In S-I area but likely muscular Didn't like the tramadol Will try short term ibuprofen Continue heat Try the PT again

## 2018-02-26 NOTE — Patient Instructions (Signed)
I will make the referral to the physical therapist. Try ice or heat as it helps Try ibuprofen 200mg  (over the counter) 1-2 tabs three times a day with food for the next week or so.

## 2018-02-27 ENCOUNTER — Telehealth: Payer: Self-pay

## 2018-02-27 NOTE — Telephone Encounter (Signed)
Don at [facility] calling pt wants to know when PT info will be faxed to [facility]; Larene Beach CMA is faxing PT info now to [facility]; Don voiced understanding and will let pt know.

## 2018-02-27 NOTE — Telephone Encounter (Signed)
Faxed order to number provided. Received confirmation that it went through. Spoke to pt.

## 2018-02-27 NOTE — Telephone Encounter (Signed)
Relation to pt: self  Call back number: [phone]     Reason for call: [facility] states forms were never received and would like forms fax to [facility] fax # [phone]. Patient states when confirmation is received please advise patient.

## 2018-02-27 NOTE — Telephone Encounter (Signed)
Copied from [place] [id number]. Topic: Quick Communication - See Telephone Encounter >> Feb 27, 2018  9:48 AM Marja Kays F wrote: Pt is needing to talk with someone regarding faxing information to [facility] to the physical therapy department  Best number [phone]

## 2018-02-27 NOTE — Telephone Encounter (Signed)
Order faxed. Received confirmation.

## 2018-03-01 ENCOUNTER — Encounter: Payer: Self-pay | Admitting: Emergency Medicine

## 2018-03-01 ENCOUNTER — Emergency Department: Payer: Medicare Other

## 2018-03-01 ENCOUNTER — Other Ambulatory Visit: Payer: Self-pay

## 2018-03-01 ENCOUNTER — Emergency Department
Admission: EM | Admit: 2018-03-01 | Discharge: 2018-03-01 | Disposition: A | Discharge status: Home / Self Care | Payer: Medicare Other | Attending: Emergency Medicine | Admitting: Emergency Medicine

## 2018-03-01 DIAGNOSIS — Z7982 Long term (current) use of aspirin: Secondary | ICD-10-CM | POA: Insufficient documentation

## 2018-03-01 DIAGNOSIS — Y92129 Unspecified place in nursing home as the place of occurrence of the external cause: Secondary | ICD-10-CM | POA: Diagnosis not present

## 2018-03-01 DIAGNOSIS — Y998 Other external cause status: Secondary | ICD-10-CM | POA: Diagnosis not present

## 2018-03-01 DIAGNOSIS — Z87891 Personal history of nicotine dependence: Secondary | ICD-10-CM | POA: Diagnosis not present

## 2018-03-01 DIAGNOSIS — M8468XA Pathological fracture in other disease, other site, initial encounter for fracture: Secondary | ICD-10-CM | POA: Insufficient documentation

## 2018-03-01 DIAGNOSIS — F419 Anxiety disorder, unspecified: Secondary | ICD-10-CM | POA: Insufficient documentation

## 2018-03-01 DIAGNOSIS — Z96641 Presence of right artificial hip joint: Secondary | ICD-10-CM | POA: Diagnosis not present

## 2018-03-01 DIAGNOSIS — M4856XA Collapsed vertebra, not elsewhere classified, lumbar region, initial encounter for fracture: Secondary | ICD-10-CM

## 2018-03-01 DIAGNOSIS — X500XXA Overexertion from strenuous movement or load, initial encounter: Secondary | ICD-10-CM | POA: Diagnosis not present

## 2018-03-01 DIAGNOSIS — M545 Low back pain: Secondary | ICD-10-CM | POA: Diagnosis not present

## 2018-03-01 DIAGNOSIS — Z79899 Other long term (current) drug therapy: Secondary | ICD-10-CM | POA: Diagnosis not present

## 2018-03-01 DIAGNOSIS — Y9389 Activity, other specified: Secondary | ICD-10-CM | POA: Insufficient documentation

## 2018-03-01 DIAGNOSIS — F329 Major depressive disorder, single episode, unspecified: Secondary | ICD-10-CM | POA: Diagnosis not present

## 2018-03-01 DIAGNOSIS — M8448XA Pathological fracture, other site, initial encounter for fracture: Secondary | ICD-10-CM | POA: Diagnosis not present

## 2018-03-01 MED ORDER — ACETAMINOPHEN 325 MG PO TABS
650.0000 mg | ORAL_TABLET | Freq: Once | ORAL | Status: AC
  Administered 2018-03-01: 650 mg via ORAL
  Filled 2018-03-01: qty 2

## 2018-03-01 MED ORDER — TRAMADOL HCL 50 MG PO TABS: 50.0000 mg | ORAL_TABLET | Freq: Four times a day (QID) | ORAL | 0 refills | Status: DC | PRN

## 2018-03-01 NOTE — ED Provider Notes (Signed)
[facility] Emergency Department Provider Note   ____________________________________________    I have reviewed the triage vital signs and the nursing notes.   HISTORY  Chief Complaint Back Pain     HPI Bridget Mendez is a 84 y.o. female who presents with complaints of back pain.  Patient describes low back pain which is "grabbing "in nature, particular when she moves in a particular direction.  She reports is on both sides of her low back.  She suspects this is related to lots of moving and lifting as she prepares to move from independent living to a facility with more support for her.  Saw her PCP earlier this week but has not had any significant improvement.  Has been taking Tylenol.  Denies neuro deficits.  No difficulty urinating.  No difficulties with stooling.  No radiation of pain.  No abdominal pain.  Past Medical History:  Diagnosis Date  . Allergy   . Anxiety   . Depression   . GERD (gastroesophageal reflux disease)   . Osteoarthritis   . Osteoporosis     Patient Active Problem List   Diagnosis Date Noted  . Neck pain 10/04/2017  . PVC (premature ventricular contraction) 06/09/2017  . First degree AV block 06/09/2017  . Intraventricular conduction delay 06/09/2017  . Arrhythmia 06/09/2017  . Weakness 05/31/2017  . Orthostatic hypotension 05/18/2015  . IBS (irritable bowel syndrome) 02/10/2015  . Advance directive discussed with patient 12/17/2014  . Low back pain 07/19/2012  . Routine general medical examination at a health care facility 10/17/2011  . Episodic mood disorder ([place]) 04/28/2010  . ALLERGIC RHINITIS 09/15/2008  . GERD 10/24/2006  . Primary osteoarthritis involving multiple joints 10/24/2006  . Osteoporosis 10/24/2006    Past Surgical History:  Procedure Laterality Date  . CESAREAN SECTION     x2  . HEMORRHOID SURGERY    . INGUINAL HERNIA REPAIR  02/2009   Dr.Wilton Tamala Julian  . KNEE ARTHROSCOPY  1998 & 2005     both knees  . TOTAL HIP ARTHROPLASTY Right 07/29/2014   Procedure: RIGHT TOTAL HIP ARTHROPLASTY ANTERIOR APPROACH;  Surgeon: Hessie Dibble, MD;  Location: [place];  Service: Orthopedics;  Laterality: Right;    Prior to Admission medications   Medication Sig Start Date End Date Taking? Authorizing Provider  acetaminophen (TYLENOL) 650 MG CR tablet Take 650 mg by mouth 2 (two) times daily.    [provider]  ALPRAZolam [name]) 0.5 MG tablet TAKE 1/2 TO 1 TABLET BY MOUTH TWICE DAILY AS NEEDED FOR ANXIETY 01/15/18   Viviana Simpler I, MD  aspirin 81 MG tablet Take 81 mg by mouth daily.    [provider]  calcium carbonate (TUMS EX) 750 MG chewable tablet Chew 1-2 tablets by mouth daily.    [provider]  methylcellulose oral powder Take by mouth.    [provider]  metoprolol succinate (TOPROL-XL) 25 MG 24 hr tablet Take 1 tablet (25 mg total) by mouth daily. Take with or immediately following a meal. 12/19/17   Gollan, Kathlene November, MD  Multiple Vitamin (MULTIVITAMIN) tablet Take 2 tablets by mouth daily.     [provider]  Probiotic Product (PROBIOTIC DAILY) CAPS Take 1 capsule by mouth daily.     [provider]  traMADol (ULTRAM) 50 MG tablet Take 1 tablet (50 mg total) by mouth every 6 (six) hours as needed. 03/01/18 03/01/19  Lavonia Drafts, MD     Allergies Patient has  no known allergies.  Family History  Problem Relation Age of Onset  . Parkinsonism Mother   . Cancer Maternal Grandmother     Social History Social History   Tobacco Use  . Smoking status: Former Smoker    Packs/day: 1.00    Types: Cigarettes    Last attempt to quit: 07/11/1988    Years since quitting: 29.6  . Smokeless tobacco: Never Used  Substance Use Topics  . Alcohol use: Yes    Comment: 2 drinks in the evening  . Drug use: No    Review of Systems  Constitutional: No fevers  ENT: No neck pain   Gastrointestinal: No abdominal pain.    Genitourinary: Negative for dysuria. Musculoskeletal: As above Skin: Negative for rash. Neurological: Negative for weakness    ____________________________________________   PHYSICAL EXAM:  VITAL SIGNS: ED Triage Vitals [03/01/18 0933]  Enc Vitals Group     BP (!) 161/77     Pulse Rate 90     Resp 16     Temp 98.1 F (36.7 C)     Temp Source Oral     SpO2 96 %     Weight 72.6 kg (160 lb)     Height 1.6 m (5\' 3" )     Head Circumference      Peak Flow      Pain Score 9     Pain Loc      Pain Edu?      Excl. in [place]?      Constitutional: Alert and oriented. No acute distress. Pleasant and interactive Eyes: Conjunctivae are normal.  Head: Atraumatic. Nose: No congestion/rhinnorhea. Mouth/Throat: Mucous membranes are moist.   Cardiovascular: Normal rate, regular rhythm.  Respiratory: Normal respiratory effort.  No retractions. GI: No pulsatile mass no bruit Genitourinary: deferred Musculoskeletal: Back: No vertebral tenderness to palpation.  Left lumbar paraspinal muscle spasm with mild ecchymosis overlying.  Normal strength in the lower extremities.  Patient is able to ambulate with some support Neurologic:  Normal speech and language. No gross focal neurologic deficits are appreciated.  No saddle anesthesia Skin:  Skin is warm, dry and intact.    ____________________________________________   LABS (all labs ordered are listed, but only abnormal results are displayed)  Labs Reviewed - No data to display ____________________________________________  EKG   ____________________________________________  RADIOLOGY  Lumbar spine x-ray demonstrates compression deformity of L2 and L3 ____________________________________________   PROCEDURES  Procedure(s) performed: No  Procedures   Critical Care performed: No ____________________________________________   INITIAL IMPRESSION / ASSESSMENT AND PLAN / ED COURSE  Pertinent labs & imaging results that were  available during my care of the patient were reviewed by me and considered in my medical decision making (see chart for details).  Patient well-appearing in no acute distress.  No pain while sitting and resting, however twisting or moving is quite uncomfortable for her.  Denies fall or trauma.  No neuro deficits.  Pain is been ongoing for 4 days.  Differential includes compression fracture, muscle spasm, arthritis.  No abdominal mass or tenderness or bruit  Patient given Tylenol for pain.  X-ray demonstrates compression fractures, this is likely the cause of her symptoms.  No neurological complaints.  We will have her follow-up with orthopedics   ____________________________________________   FINAL CLINICAL IMPRESSION(S) / ED DIAGNOSES  Final diagnoses:  Compression fracture of lumbar spine, non-traumatic, initial encounter [facility])      NEW MEDICATIONS STARTED DURING THIS VISIT:  Discharge Medication List as of 03/01/2018  11:50 AM       Note:  This document was prepared using Dragon voice recognition software and may include unintentional dictation errors.    Lavonia Drafts, MD 03/01/18 1226

## 2018-03-01 NOTE — ED Notes (Signed)
Pt verbalized understanding of discharge instructions. NAD at this time. 

## 2018-03-01 NOTE — ED Triage Notes (Addendum)
PT to ED via [facility], pt c/o lower back pain xfew days denies any acute injury. PT states started after grocery shopping xfew days ago. PT A&Ox4, VSS. Pt states under a lot of stress due to moving into assit living. NAD noted, dneies GU symptoms

## 2018-03-01 NOTE — ED Notes (Signed)
First nurse note: [facility] via EMS from [facility] community with c/o lower back pain, denies fall, has been seen by Dr for the same, was put on ibuprofen for pain this Monday. Alert and oriented. VSS.

## 2018-03-05 DIAGNOSIS — S32020A Wedge compression fracture of second lumbar vertebra, initial encounter for closed fracture: Secondary | ICD-10-CM | POA: Diagnosis not present

## 2018-03-05 DIAGNOSIS — M47816 Spondylosis without myelopathy or radiculopathy, lumbar region: Secondary | ICD-10-CM | POA: Diagnosis not present

## 2018-03-05 DIAGNOSIS — S32030A Wedge compression fracture of third lumbar vertebra, initial encounter for closed fracture: Secondary | ICD-10-CM | POA: Diagnosis not present

## 2018-03-07 ENCOUNTER — Other Ambulatory Visit: Payer: Self-pay | Admitting: Internal Medicine

## 2018-03-07 DIAGNOSIS — M545 Low back pain: Secondary | ICD-10-CM | POA: Diagnosis not present

## 2018-03-07 NOTE — Telephone Encounter (Signed)
Patient called and discussed the refill request of Tramadol. She says she was seen in the emergency room and was given a prescription for Tramadol and she has 3 pills left. She says she started physical therapy today. I advised she will need a hospital follow up visit with Dr. Silvio Pate to evaluate the back pain, she agrees to an appointment on Friday, 03/09/18 at 1130. I advised I will send the refill request to Dr. Silvio Pate.  Tramadol refill Last Refill:03/01/18 # 20 by another provider Last OV:02/26/18 PCP: [place]: [facility] [place], [place] [phone] (Phone) [phone] (Fax)

## 2018-03-07 NOTE — Telephone Encounter (Signed)
Copied from [place] [id number]. Topic: Quick Communication - Rx Refill/Question >> Mar 07, 2018  3:56 PM Cecelia Byars, NT wrote: Medication: traMADol (ULTRAM) 50 MG tablet  Has the patient contacted their pharmacy? yes  (Agent: If no, request that the patient contact the pharmacy for the refill. (Agent: If yes, when and what did the pharmacy advise?  Preferred Pharmacy (with phone number or street name [facility] [place], [place] - [address] [phone] (Phone) [phone] (Fax)  Patient was recently in ED and would like a refill for the above medication   Agent: Please be advised that RX refills may take up to 3 business days. We ask that you follow-up with your pharmacy.

## 2018-03-08 MED ORDER — TRAMADOL HCL 50 MG PO TABS: 50.0000 mg | ORAL_TABLET | Freq: Four times a day (QID) | ORAL | 0 refills | Status: DC | PRN

## 2018-03-09 ENCOUNTER — Inpatient Hospital Stay: Payer: Medicare Other | Admitting: Internal Medicine

## 2018-03-09 DIAGNOSIS — M545 Low back pain: Secondary | ICD-10-CM | POA: Diagnosis not present

## 2018-03-12 DIAGNOSIS — M545 Low back pain: Secondary | ICD-10-CM | POA: Diagnosis not present

## 2018-03-13 ENCOUNTER — Other Ambulatory Visit: Payer: Self-pay | Admitting: Internal Medicine

## 2018-03-13 ENCOUNTER — Telehealth: Payer: Self-pay | Admitting: Internal Medicine

## 2018-03-13 DIAGNOSIS — F39 Unspecified mood [affective] disorder: Secondary | ICD-10-CM

## 2018-03-13 NOTE — Telephone Encounter (Signed)
Last filled 01-15-18 #60 Last OV 02-26-18 Next OV 04-10-18

## 2018-03-13 NOTE — Telephone Encounter (Signed)
Copied from [place] [id number]. Topic: Quick Communication - See Telephone Encounter >> Mar 13, 2018  8:10 AM Gardiner Ramus wrote: CRM for notification. See Telephone encounter for: 03/13/18. Pt called and stated that she would like DNR paper work mailed to her house. Pt stated that [facility] does not have the information anymore.

## 2018-03-13 NOTE — Telephone Encounter (Signed)
DNR placed in Dr Alla German Inbox on his desk

## 2018-03-14 DIAGNOSIS — M545 Low back pain: Secondary | ICD-10-CM | POA: Diagnosis not present

## 2018-03-14 NOTE — Telephone Encounter (Signed)
DNR signed I expect her to be moving into assisted living later this month

## 2018-03-14 NOTE — Telephone Encounter (Signed)
Spoke to pt. She said Sharee Pimple from [facility] brought her one yesterday. I have shredded this one.

## 2018-03-16 DIAGNOSIS — M545 Low back pain: Secondary | ICD-10-CM | POA: Diagnosis not present

## 2018-03-19 DIAGNOSIS — M545 Low back pain: Secondary | ICD-10-CM | POA: Diagnosis not present

## 2018-03-21 DIAGNOSIS — M545 Low back pain: Secondary | ICD-10-CM | POA: Diagnosis not present

## 2018-03-23 DIAGNOSIS — M545 Low back pain: Secondary | ICD-10-CM | POA: Diagnosis not present

## 2018-03-28 DIAGNOSIS — M545 Low back pain: Secondary | ICD-10-CM | POA: Diagnosis not present

## 2018-03-30 DIAGNOSIS — M545 Low back pain: Secondary | ICD-10-CM | POA: Diagnosis not present

## 2018-04-02 DIAGNOSIS — M545 Low back pain: Secondary | ICD-10-CM | POA: Diagnosis not present

## 2018-04-04 DIAGNOSIS — M545 Low back pain: Secondary | ICD-10-CM | POA: Diagnosis not present

## 2018-04-05 ENCOUNTER — Ambulatory Visit: Payer: Medicare Other | Admitting: Internal Medicine

## 2018-04-05 ENCOUNTER — Encounter: Payer: Self-pay | Admitting: Internal Medicine

## 2018-04-05 VITALS — BP 138/72 | HR 74 | Resp 14

## 2018-04-05 DIAGNOSIS — F39 Unspecified mood [affective] disorder: Secondary | ICD-10-CM | POA: Diagnosis not present

## 2018-04-05 DIAGNOSIS — M8000XG Age-related osteoporosis with current pathological fracture, unspecified site, subsequent encounter for fracture with delayed healing: Secondary | ICD-10-CM | POA: Diagnosis not present

## 2018-04-05 DIAGNOSIS — I493 Ventricular premature depolarization: Secondary | ICD-10-CM | POA: Diagnosis not present

## 2018-04-05 DIAGNOSIS — S32020A Wedge compression fracture of second lumbar vertebra, initial encounter for closed fracture: Secondary | ICD-10-CM

## 2018-04-05 MED ORDER — HYDROCODONE-ACETAMINOPHEN 5-325 MG PO TABS: 1.0000 | ORAL_TABLET | ORAL | 0 refills | Status: DC | PRN

## 2018-04-05 MED ORDER — RISEDRONATE SODIUM 150 MG PO TABS: 150.0000 mg | ORAL_TABLET | ORAL | 11 refills | Status: DC

## 2018-04-05 NOTE — Assessment & Plan Note (Signed)
Occurred 1 month ago but still severe pain Will try hydrocodone for pain Regular ibuprofen  Back to Dr Rudene Christians to consider vertebroplasty Will start bisphosphonates

## 2018-04-05 NOTE — Assessment & Plan Note (Signed)
Will start vitamin D and bisphosphonate

## 2018-04-05 NOTE — Assessment & Plan Note (Signed)
Chronic anxiety Continues on the alprazolam prn Seems to have adjusted to the move okay---other than the severe back pain

## 2018-04-05 NOTE — Progress Notes (Addendum)
Subjective:    Patient ID: Bridget Mendez, female    DOB: [birth year], 84 y.o.   MRN: [id number]  HPI Initial visit in [place] apartment for review of chronic health conditions Reviewed status with Luellen Pucker RN  Had compression fractures of L2 and L3 about a month ago Saw ortho after ER visit and respite stay Tried tramadol but this didn't help Has been getting PT--but limited due to the pain No radiation of the pain  She knows she was ready for AL Trouble making it down to meals---satisfied with the food Overall, feels the move was okay (except she is suffering with the pain)  Mood has been troubled due to pain Hard to judge right now  Current Outpatient Medications on File Prior to Visit  Medication Sig Dispense Refill  . acetaminophen (TYLENOL) 650 MG CR tablet Take 650 mg by mouth 2 (two) times daily.    Marland Kitchen ALPRAZolam (XANAX) 0.5 MG tablet TAKE 1/2 TO 1 TABLET BY MOUTH TWICE DAILY AS NEEDED FOR ANXIETY 60 tablet 0  . aspirin 81 MG tablet Take 81 mg by mouth daily.    . calcium carbonate (TUMS EX) 750 MG chewable tablet Chew 1-2 tablets by mouth daily.    . methylcellulose oral powder Take by mouth.    . metoprolol succinate (TOPROL-XL) 25 MG 24 hr tablet Take 1 tablet (25 mg total) by mouth daily. Take with or immediately following a meal. 90 tablet 1  . Multiple Vitamin (MULTIVITAMIN) tablet Take 2 tablets by mouth daily.     . Probiotic Product (PROBIOTIC DAILY) CAPS Take 1 capsule by mouth daily.     . traMADol (ULTRAM) 50 MG tablet Take 1 tablet (50 mg total) by mouth every 6 (six) hours as needed. 30 tablet 0   No current facility-administered medications on file prior to visit.     No Known Allergies  Past Medical History:  Diagnosis Date  . Allergy   . Anxiety   . Depression   . GERD (gastroesophageal reflux disease)   . Osteoarthritis   . Osteoporosis     Past Surgical History:  Procedure Laterality Date  . CESAREAN SECTION     x2  . HEMORRHOID SURGERY    .  INGUINAL HERNIA REPAIR  02/2009   Dr.Wilton Tamala Julian  . KNEE ARTHROSCOPY  1998 & 2005   both knees  . TOTAL HIP ARTHROPLASTY Right 07/29/2014   Procedure: RIGHT TOTAL HIP ARTHROPLASTY ANTERIOR APPROACH;  Surgeon: Hessie Dibble, MD;  Location: [place];  Service: Orthopedics;  Laterality: Right;    Family History  Problem Relation Age of Onset  . Parkinsonism Mother   . Cancer Maternal Grandmother     Social History   Socioeconomic History  . Marital status: Widowed    Spouse name: Not on file  . Number of children: 2  . Years of education: Not on file  . Highest education level: Not on file  Occupational History  . Occupation: retired  Scientific laboratory technician  . Financial resource strain: Not on file  . Food insecurity:    Worry: Not on file    Inability: Not on file  . Transportation needs:    Medical: Not on file    Non-medical: Not on file  Tobacco Use  . Smoking status: Former Smoker    Packs/day: 1.00    Types: Cigarettes    Last attempt to quit: 07/11/1988    Years since quitting: 29.7  . Smokeless tobacco: Never Used  Substance  and Sexual Activity  . Alcohol use: Yes    Comment: 2 drinks in the evening  . Drug use: No  . Sexual activity: Not on file  Lifestyle  . Physical activity:    Days per week: Not on file    Minutes per session: Not on file  . Stress: Not on file  Relationships  . Social connections:    Talks on phone: Not on file    Gets together: Not on file    Attends religious service: Not on file    Active member of club or organization: Not on file    Attends meetings of clubs or organizations: Not on file    Relationship status: Not on file  . Intimate partner violence:    Fear of current or ex partner: Not on file    Emotionally abused: Not on file    Physically abused: Not on file    Forced sexual activity: Not on file  Other Topics Concern  . Not on file  Social History Narrative   Has living will   Daughter, [name], is health care POA--- son  [name] is alternate   Has DNR already--reviewed   No tube feeds if cognitively aware      Review of Systems Is comfortable in bed--but very hard to get out of  No heartburn or  Appetite is okay No chest pain No SOB     Objective:   Physical Exam  Constitutional:  Paroxysms of severe pain --especially leaning forward in chair Is able to walk around  Neck: No thyromegaly present.  Cardiovascular: Normal rate and regular rhythm. Exam reveals no gallop.  Soft aortic systolic murmur  Respiratory: Effort normal and breath sounds normal. No respiratory distress. She has no wheezes.  GI: Soft. There is no tenderness.  Musculoskeletal:  Diffuse lumbar tenderness  Lymphadenopathy:    She has no cervical adenopathy.  Psychiatric:  Anxious due to the pain           Assessment & Plan:

## 2018-04-05 NOTE — Assessment & Plan Note (Signed)
Not evident now on the beta blocker

## 2018-04-06 DIAGNOSIS — M545 Low back pain: Secondary | ICD-10-CM | POA: Diagnosis not present

## 2018-04-09 DIAGNOSIS — M545 Low back pain: Secondary | ICD-10-CM | POA: Diagnosis not present

## 2018-04-10 ENCOUNTER — Encounter: Payer: Medicare Other | Admitting: Internal Medicine

## 2018-04-10 DIAGNOSIS — S32000A Wedge compression fracture of unspecified lumbar vertebra, initial encounter for closed fracture: Secondary | ICD-10-CM

## 2018-04-10 HISTORY — DX: Wedge compression fracture of unspecified lumbar vertebra, initial encounter for closed fracture: S32.000A

## 2018-04-11 DIAGNOSIS — M545 Low back pain: Secondary | ICD-10-CM | POA: Diagnosis not present

## 2018-04-13 ENCOUNTER — Telehealth: Payer: Self-pay

## 2018-04-13 NOTE — Telephone Encounter (Signed)
Copied from [place] [id number]. Topic: General - Other >> Apr 13, 2018  1:13 PM Mcneil, Ja-Kwan wrote: Reason for CRM: Pt states her pain is severe and she needs to either get something different or  the medication needs to be stronger as the current medication is not helping with the pain. Pt request call back to advise.

## 2018-04-13 NOTE — Telephone Encounter (Signed)
Pt seen 04/06/18; Dr Silvio Pate is not in office this afternoon.I spoke with pt; Pt is having pain in lower back;  Pt said she has spoken with nurses at [facility] and previously they have given pt pain med bid. Pt said they are going to give Hydrocodone q 4 h po prn. Pt thinks this will take care of her pain; nothing further needed at this time. Pt is appreciative. FYI to Dr Silvio Pate.

## 2018-04-14 NOTE — Telephone Encounter (Signed)
Yes, I communicated with Luellen Pucker the RN---clarified that the norco was ordered q4hr prn so could be more frequent

## 2018-04-23 ENCOUNTER — Other Ambulatory Visit: Payer: Self-pay | Admitting: Orthopedic Surgery

## 2018-04-23 ENCOUNTER — Telehealth: Payer: Self-pay | Admitting: Family Medicine

## 2018-04-23 DIAGNOSIS — S32030G Wedge compression fracture of third lumbar vertebra, subsequent encounter for fracture with delayed healing: Secondary | ICD-10-CM

## 2018-04-23 DIAGNOSIS — M48061 Spinal stenosis, lumbar region without neurogenic claudication: Secondary | ICD-10-CM | POA: Diagnosis not present

## 2018-04-23 DIAGNOSIS — S32020G Wedge compression fracture of second lumbar vertebra, subsequent encounter for fracture with delayed healing: Secondary | ICD-10-CM | POA: Diagnosis not present

## 2018-04-23 DIAGNOSIS — R937 Abnormal findings on diagnostic imaging of other parts of musculoskeletal system: Secondary | ICD-10-CM | POA: Diagnosis not present

## 2018-04-23 MED ORDER — HYDROCODONE-ACETAMINOPHEN 5-325 MG PO TABS: 1.0000 | ORAL_TABLET | ORAL | 0 refills | Status: DC | PRN

## 2018-04-23 NOTE — Telephone Encounter (Signed)
Received request to refill hydrocodone apap 5/325 to [facility]. Chart reviewed. [place] CSRS reviewed. No hydrocodone fills in database ?due to pharmacy used.  Refilled #60, RF0.

## 2018-04-24 ENCOUNTER — Ambulatory Visit: Payer: Medicare Other | Admitting: Anesthesiology

## 2018-04-24 ENCOUNTER — Encounter: Payer: Self-pay | Admitting: Anesthesiology

## 2018-04-24 ENCOUNTER — Ambulatory Visit: Payer: Medicare Other

## 2018-04-24 ENCOUNTER — Ambulatory Visit
Admission: RE | Admit: 2018-04-24 | Discharge: 2018-04-24 | Disposition: A | Discharge status: Skilled Nursing Facility | Payer: Medicare Other | Source: Ambulatory Visit | Attending: Orthopedic Surgery | Admitting: Orthopedic Surgery

## 2018-04-24 ENCOUNTER — Other Ambulatory Visit: Payer: Self-pay

## 2018-04-24 ENCOUNTER — Encounter
Admission: RE | Disposition: A | Discharge status: Skilled Nursing Facility | Payer: Self-pay | Source: Ambulatory Visit | Attending: Orthopedic Surgery

## 2018-04-24 DIAGNOSIS — S32020A Wedge compression fracture of second lumbar vertebra, initial encounter for closed fracture: Secondary | ICD-10-CM | POA: Insufficient documentation

## 2018-04-24 DIAGNOSIS — Z01810 Encounter for preprocedural cardiovascular examination: Secondary | ICD-10-CM | POA: Diagnosis not present

## 2018-04-24 DIAGNOSIS — S32030A Wedge compression fracture of third lumbar vertebra, initial encounter for closed fracture: Secondary | ICD-10-CM | POA: Diagnosis not present

## 2018-04-24 DIAGNOSIS — F329 Major depressive disorder, single episode, unspecified: Secondary | ICD-10-CM | POA: Diagnosis not present

## 2018-04-24 DIAGNOSIS — K219 Gastro-esophageal reflux disease without esophagitis: Secondary | ICD-10-CM | POA: Diagnosis not present

## 2018-04-24 DIAGNOSIS — S32010A Wedge compression fracture of first lumbar vertebra, initial encounter for closed fracture: Secondary | ICD-10-CM | POA: Diagnosis not present

## 2018-04-24 DIAGNOSIS — J309 Allergic rhinitis, unspecified: Secondary | ICD-10-CM | POA: Diagnosis not present

## 2018-04-24 DIAGNOSIS — Z7401 Bed confinement status: Secondary | ICD-10-CM | POA: Diagnosis not present

## 2018-04-24 DIAGNOSIS — Z419 Encounter for procedure for purposes other than remedying health state, unspecified: Secondary | ICD-10-CM

## 2018-04-24 DIAGNOSIS — F419 Anxiety disorder, unspecified: Secondary | ICD-10-CM | POA: Diagnosis not present

## 2018-04-24 DIAGNOSIS — M545 Low back pain: Secondary | ICD-10-CM | POA: Diagnosis not present

## 2018-04-24 DIAGNOSIS — Z981 Arthrodesis status: Secondary | ICD-10-CM | POA: Diagnosis not present

## 2018-04-24 DIAGNOSIS — F418 Other specified anxiety disorders: Secondary | ICD-10-CM | POA: Diagnosis not present

## 2018-04-24 HISTORY — PX: KYPHOPLASTY: SHX5884

## 2018-04-24 HISTORY — DX: Wedge compression fracture of unspecified lumbar vertebra, initial encounter for closed fracture: S32.000A

## 2018-04-24 LAB — BASIC METABOLIC PANEL
Anion gap: 11 (ref 5–15)
BUN: 19 mg/dL (ref 8–23)
CALCIUM: 9 mg/dL (ref 8.9–10.3)
CO2: 23 mmol/L (ref 22–32)
CREATININE: 0.92 mg/dL (ref 0.44–1.00)
Chloride: 106 mmol/L (ref 98–111)
GFR, EST AFRICAN AMERICAN: 59 mL/min — AB (ref >60)
GFR, EST NON AFRICAN AMERICAN: 51 mL/min — AB (ref >60)
Glucose, Bld: 110 mg/dL — ABNORMAL HIGH (ref 70–99)
Potassium: 4.1 mmol/L (ref 3.5–5.1)
SODIUM: 140 mmol/L (ref 135–145)

## 2018-04-24 LAB — CBC
HCT: 39.7 % (ref 36.0–46.0)
Hemoglobin: 12.7 g/dL (ref 12.0–15.0)
MCH: 31.6 pg (ref 26.0–34.0)
MCHC: 32 g/dL (ref 30.0–36.0)
MCV: 98.8 fL (ref 80.0–100.0)
PLATELETS: 371 10*3/uL (ref 150–400)
RBC: 4.02 MIL/uL (ref 3.87–5.11)
RDW: 14 % (ref 11.5–15.5)
WBC: 6.6 10*3/uL (ref 4.0–10.5)
nRBC: 0 % (ref 0.0–0.2)

## 2018-04-24 SURGERY — KYPHOPLASTY
Anesthesia: General

## 2018-04-24 MED ORDER — FAMOTIDINE 20 MG PO TABS
ORAL_TABLET | ORAL | Status: AC
  Filled 2018-04-24: qty 1

## 2018-04-24 MED ORDER — METOCLOPRAMIDE HCL 5 MG/ML IJ SOLN: 5.0000 mg | Freq: Three times a day (TID) | INTRAMUSCULAR | Status: DC | PRN

## 2018-04-24 MED ORDER — FENTANYL CITRATE (PF) 100 MCG/2ML IJ SOLN
INTRAMUSCULAR | Status: AC
  Filled 2018-04-24: qty 2

## 2018-04-24 MED ORDER — ONDANSETRON HCL 4 MG/2ML IJ SOLN: 4.0000 mg | Freq: Once | INTRAMUSCULAR | Status: DC | PRN

## 2018-04-24 MED ORDER — CEFAZOLIN SODIUM-DEXTROSE 2-4 GM/100ML-% IV SOLN
INTRAVENOUS | Status: AC
  Filled 2018-04-24: qty 100

## 2018-04-24 MED ORDER — METOCLOPRAMIDE HCL 10 MG PO TABS: 5.0000 mg | ORAL_TABLET | Freq: Three times a day (TID) | ORAL | Status: DC | PRN

## 2018-04-24 MED ORDER — PROPOFOL 500 MG/50ML IV EMUL
INTRAVENOUS | Status: DC | PRN
  Administered 2018-04-24: 25 ug/kg/min via INTRAVENOUS

## 2018-04-24 MED ORDER — FENTANYL CITRATE (PF) 100 MCG/2ML IJ SOLN
INTRAMUSCULAR | Status: DC | PRN
  Administered 2018-04-24: 50 ug via INTRAVENOUS

## 2018-04-24 MED ORDER — BUPIVACAINE-EPINEPHRINE (PF) 0.5% -1:200000 IJ SOLN
INTRAMUSCULAR | Status: DC | PRN
  Administered 2018-04-24: 20 mL

## 2018-04-24 MED ORDER — FAMOTIDINE 20 MG PO TABS
20.0000 mg | ORAL_TABLET | Freq: Once | ORAL | Status: AC
  Administered 2018-04-24: 20 mg via ORAL

## 2018-04-24 MED ORDER — METOPROLOL SUCCINATE ER 25 MG PO TB24
25.0000 mg | ORAL_TABLET | Freq: Once | ORAL | Status: AC
  Administered 2018-04-24: 25 mg via ORAL
  Filled 2018-04-24: qty 1

## 2018-04-24 MED ORDER — CEFAZOLIN SODIUM-DEXTROSE 2-4 GM/100ML-% IV SOLN
2.0000 g | Freq: Once | INTRAVENOUS | Status: AC
  Administered 2018-04-24: 2 g via INTRAVENOUS

## 2018-04-24 MED ORDER — ONDANSETRON HCL 4 MG PO TABS: 4.0000 mg | ORAL_TABLET | Freq: Four times a day (QID) | ORAL | Status: DC | PRN

## 2018-04-24 MED ORDER — BUPIVACAINE-EPINEPHRINE (PF) 0.5% -1:200000 IJ SOLN
INTRAMUSCULAR | Status: AC
  Filled 2018-04-24: qty 30

## 2018-04-24 MED ORDER — LACTATED RINGERS IV SOLN
INTRAVENOUS | Status: DC
  Administered 2018-04-24: 14:00:00 via INTRAVENOUS

## 2018-04-24 MED ORDER — SODIUM CHLORIDE 0.9 % IV SOLN: INTRAVENOUS | Status: DC

## 2018-04-24 MED ORDER — HYDROCODONE-ACETAMINOPHEN 5-325 MG PO TABS: 1.0000 | ORAL_TABLET | ORAL | Status: DC | PRN

## 2018-04-24 MED ORDER — LIDOCAINE HCL (PF) 1 % IJ SOLN
INTRAMUSCULAR | Status: AC
  Filled 2018-04-24: qty 60

## 2018-04-24 MED ORDER — KETAMINE HCL 50 MG/ML IJ SOLN
INTRAMUSCULAR | Status: DC | PRN
  Administered 2018-04-24: 10 mg via INTRAVENOUS
  Administered 2018-04-24: 15 mg via INTRAMUSCULAR

## 2018-04-24 MED ORDER — HYDROCODONE-ACETAMINOPHEN 5-325 MG PO TABS: 1.0000 | ORAL_TABLET | Freq: Four times a day (QID) | ORAL | 0 refills | Status: DC | PRN

## 2018-04-24 MED ORDER — FENTANYL CITRATE (PF) 100 MCG/2ML IJ SOLN: 25.0000 ug | INTRAMUSCULAR | Status: DC | PRN

## 2018-04-24 MED ORDER — PROPOFOL 10 MG/ML IV BOLUS
INTRAVENOUS | Status: DC | PRN
  Administered 2018-04-24 (×2): 20 mg via INTRAVENOUS

## 2018-04-24 MED ORDER — IOPAMIDOL (ISOVUE-M 200) INJECTION 41%
INTRAMUSCULAR | Status: DC | PRN
  Administered 2018-04-24: 20 mL

## 2018-04-24 MED ORDER — LIDOCAINE HCL 1 % IJ SOLN
INTRAMUSCULAR | Status: DC | PRN
  Administered 2018-04-24: 30 mL

## 2018-04-24 MED ORDER — ONDANSETRON HCL 4 MG/2ML IJ SOLN: 4.0000 mg | Freq: Four times a day (QID) | INTRAMUSCULAR | Status: DC | PRN

## 2018-04-24 MED ORDER — IOPAMIDOL (ISOVUE-M 200) INJECTION 41%
INTRAMUSCULAR | Status: AC
  Filled 2018-04-24: qty 20

## 2018-04-24 SURGICAL SUPPLY — 17 items

## 2018-04-24 NOTE — Anesthesia Preprocedure Evaluation (Signed)
Anesthesia Evaluation  Patient identified by MRN, date of birth, ID band Patient awake    Reviewed: Allergy & Precautions, NPO status , Patient's Chart, lab work & pertinent test results  Airway Mallampati: II  TM Distance: >3 FB Neck ROM: Full    Dental  (+) Teeth Intact   Pulmonary former smoker,    breath sounds clear to auscultation       Cardiovascular + dysrhythmias  Rhythm:Regular Rate:Normal     Neuro/Psych PSYCHIATRIC DISORDERS Anxiety Depression    GI/Hepatic Neg liver ROS, GERD  ,IBS   Endo/Other  negative endocrine ROS  Renal/GU negative Renal ROS     Musculoskeletal  (+) Arthritis , Osteoarthritis,    Abdominal   Peds  Hematology   Anesthesia Other Findings Past Medical History: No date: Allergy No date: Anxiety No date: Depression No date: GERD (gastroesophageal reflux disease) No date: Osteoarthritis No date: Osteoporosis  Reproductive/Obstetrics                             Anesthesia Physical  Anesthesia Plan  ASA: III  Anesthesia Plan: MAC and General   Post-op Pain Management:    Induction:   PONV Risk Score and Plan: Propofol infusion  Airway Management Planned: Nasal Cannula  Additional Equipment:   Intra-op Plan:   Post-operative Plan:   Informed Consent: I have reviewed the patients History and Physical, chart, labs and discussed the procedure including the risks, benefits and alternatives for the proposed anesthesia with the patient or authorized representative who has indicated his/her understanding and acceptance.   Dental advisory given  Plan Discussed with: CRNA and Surgeon  Anesthesia Plan Comments:         Anesthesia Quick Evaluation

## 2018-04-24 NOTE — H&P (Signed)
Chief Complaint  Patient presents with  . Back Pain    History of the Present Illness: Bridget Mendez is a 84 y.o. female here for follow-up of back pain.  The patient has a history of L2 and L3 compression fractures with the age indeterminate.  She previously saw Cameron Proud, PA here in the office on 03/05/2018.  She was not tender at the compression fracture sites at that time and was given a Medrol dose pack.  She returns today with complaints of pain and has been taking Norco 5-6 times per day.  She is requesting a wheelchair for transport.    The patient points to her sacral area when asked about her area of discomfort.  She notes her pain has been present since 01/08/2018.  She went to physical therapy and is unsure if therapy worsened or improved her pain.  She adds that getting out of bed is very difficult for her.  Before her back pain began she was active and participated in an exercise class and was able to do her daily activities.  Her activities have been reduced since her onset of pain.   She has not had any x-rays since her x-rays obtained at the hospital.  I have reviewed past medical, surgical, social and family history, and allergies as documented in the EMR.   Past Medical History:     Past Medical History:  Diagnosis Date  . Anxiety   . Depression   . Hemorrhoids   . Hemorrhoids     Past Surgical History:      Past Surgical History:  Procedure Laterality Date  . CESAREAN SECTION    . HEMORRHOID SURGERY    . LEFT KNEE ARTHROSCOPY  2004  . RIGHT KNEE ARTHROSCOPY  1998  . right total hip replacement Right 07/2014   [facility]  . RIGHT TOTAL KNEE ARTHROPLASTY  04/06/2006    Past Family History: FamilyHistory       Family History  Problem Relation Age of Onset  . Breast cancer Other        family hx  . Cancer Other        GM  . No Known Problems Mother       Medications:       Current Outpatient Medications Ordered  in Epic  Medication Sig Dispense Refill  . acetaminophen (TYLENOL) 650 MG ER tablet Take by mouth Take 650 mg by mouth 2 (two) times daily.    Marland Kitchen alendronate (FOSAMAX) 70 MG tablet Take 70 mg by mouth every 7 (seven) days Take with a full glass of water. Do not lie down for the next 30 min.    Marland Kitchen ALPRAZolam (XANAX) 0.5 MG tablet TAKE 1/2 TO 1 TABLET BY MOUTH TWICE DAILY AS NEEDED FOR ANXIETY    . amoxicillin (AMOXIL) 500 MG capsule TK 4 CS PO 1 HOUR PRIOR TO DENTAL APPOINTMENT  0  . calcium carbonate (TUMS E-X) 300 mg (750 mg) chewable tablet Take by mouth Chew 1-2 tablets by mouth daily.    Dortha Schwalbe.acidophilus-Bif.animalis 31 billion cell Cap Take by mouth Take 1 capsule by mouth daily.    . Lactobacillus acidophilus (PROBIOTIC ORAL) Take 1 capsule by mouth once daily    . methylcellulose (CITRUCEL) oral powder Take by mouth once daily    . metoprolol succinate (TOPROL-XL) 25 MG XL tablet Take by mouth Take 1 tablet (25 mg total) by mouth daily. Take with or immediately following a meal.    . multivitamin (  MULTIVITAMIN) tablet Take by mouth Take 2 tablets by mouth daily.    . traMADol (ULTRAM) 50 mg tablet Take 1 tablet (50 mg total) by mouth every 6 (six) hours as needed for Pain 20 tablet 0   No current Epic-ordered facility-administered medications on file.     Allergies: No Known Allergies   Body mass index is 28.35 kg/m.   Review of Systems: A comprehensive 14 point ROS was performed, reviewed, and the pertinent orthopaedic findings are documented in the HPI.      Vitals:   04/23/18 1256  BP: 120/74    General Physical Examination:  General/Constitutional: No apparent distress: well-nourished and well developed. Eyes: Pupils equal, round with synchronous movement. Lungs: Clear to auscultation HEENT: Normal Vascular: No edema, swelling or tenderness, except as noted in detailed exam. Cardiac:  Heart rate and rhythm is regular. Integumentary:  No impressive skin lesions present, except as noted in detailed exam. Neuro/Psych: Normal mood and affect, oriented to person, place and time. Lungs clear Heart RRR   Musculoskeletal Examination: On exam, the patient is tender to palpation over the sacral area.   Radiographs: Her prior imaging was reviewed today.  No new x-rays were obtained today.   Assessment:   ICD-10-CM ICD-9-CM  1. Closed wedge compression fracture of third lumbar vertebra with delayed healing, subsequent encounter S32.030G V54.17  2. Closed wedge compression fracture of second lumbar vertebra with delayed healing, subsequent encounter S32.020G V54.17     Plan: I recommend the patient have an MRI at this time for further evaluation of probable lumbar and sacral fractures.  The surgical procedures were discussed at length with her today and an AAOS brochure was also given describing the procedures.  If the MRI shows a fracture, she will possibly be scheduled for surgery tomorrow. MRI showed L1 and L2 fractures being new  Scribe Attestation: I, Candie Mile, am acting as scribe for [employer], MD

## 2018-04-24 NOTE — OR Nursing (Signed)
Report called to Becky at [facility] 3rd floor - respite care.

## 2018-04-24 NOTE — Anesthesia Post-op Follow-up Note (Signed)
Anesthesia QCDR form completed.        

## 2018-04-24 NOTE — Op Note (Signed)
04/24/2018  3:29 PM  PATIENT:  Bridget Mendez  84 y.o. female  PRE-OPERATIVE DIAGNOSIS:  CLOSED WEDGE COMPRESSION FRACTURE, L1 and L2  POST-OPERATIVE DIAGNOSIS:  CLOSED WEDGE COMPRESSION FRACTURE L1 and L2  PROCEDURE:  Procedure(s): KYPHOPLASTY (N/A) L1 and L2  SURGEON: Laurene Footman, MD  ASSISTANTS: None  ANESTHESIA:   local and MAC  EBL:  Total I/O In: 300 [I.V.:300] Out: -   BLOOD ADMINISTERED:none  DRAINS: none   LOCAL MEDICATIONS USED:  MARCAINE    and XYLOCAINE   SPECIMEN:  Source of Specimen:  L2 vertebral body  DISPOSITION OF SPECIMEN:  PATHOLOGY  COUNTS:  YES  TOURNIQUET:  * No tourniquets in log *  IMPLANTS: Bone cement  DICTATION: .Dragon Dictation patient was brought to the operating room and after adequate anesthesia was obtained, the she was placed prone.  C arm was brought in in good visualization of the affected levels was obtained in both AP and lateral projections.  After patient identification and timeout procedure was completed 5 cc of 1% Xylocaine was infiltrated at each level subcutaneously.  Next the back was prepped and draped in usual sterile fashion and repeat timeout procedure carried out.  A spinal needle was then used to get down to the right side pedicle at L1 and L2 each with a 50-50 mix of 1% Xylocaine and half percent Sensorcaine with epinephrine a total of 20 cc at each level.  After allowing this to set a small incision was made and L1 was approached first from an extrapedicular manner with biopsy attempted but not obtained care being taken to stay within the pedicle and on the AP view until the vertebral body was entered.  Drilling was carried out followed by inflation of balloon to 3cc L2 was accessed in the identical fashion but a biopsy was obtained at this level the balloon only inflated to about 1-1/2 cc.  It did break into the L2-3 disc space and there was subsequently some small extravasation into the L2-3 disc space.  When the  cement was mixed the balloons were let down and L1 was filled with approximately 4-1/2 cc of bone cement with good fill and L2 with approximately 2-1/2 cc also had good fill superior to inferior with some crossing the midline as well as a small amount going into the L2-3 disc space.  After allowing the cement to set the trochars removed and the wounds closed with Dermabond followed by Band-Aids  PLAN OF CARE: Discharge to home after PACU  PATIENT DISPOSITION:  PACU - hemodynamically stable.

## 2018-04-24 NOTE — OR Nursing (Signed)
Called Bridget Mendez for transport. She told us we would have to call EMS it was too late for them to transport. EMS called for transport.

## 2018-04-24 NOTE — Transfer of Care (Signed)
Immediate Anesthesia Transfer of Care Note  Patient: Bridget Mendez  Procedure(s) Performed: KYPHOPLASTY (N/A )  Patient Location: PACU  Anesthesia Type:General  Level of Consciousness: awake and alert   Airway & Oxygen Therapy: Patient Spontanous Breathing  Post-op Assessment: Report given to RN and Post -op Vital signs reviewed and stable  Post vital signs: Reviewed  Last Vitals:  Vitals Value Taken Time  BP 162/100 04/24/2018  3:20 PM  Temp    Pulse 79 04/24/2018  3:21 PM  Resp 18 04/24/2018  3:21 PM  SpO2 98 % 04/24/2018  3:21 PM  Vitals shown include unvalidated device data.  Last Pain:  Vitals:   04/24/18 1246  TempSrc: Oral  PainSc: 9          Complications: No apparent anesthesia complications

## 2018-04-24 NOTE — Discharge Instructions (Addendum)
AMBULATORY SURGERY  DISCHARGE INSTRUCTIONS   1) The drugs that you were given will stay in your system until tomorrow so for the next 24 hours you should not:  A) Drive an automobile B) Make any legal decisions C) Drink any alcoholic beverage   2) You may resume regular meals tomorrow.  Today it is better to start with liquids and gradually work up to solid foods.  You may eat anything you prefer, but it is better to start with liquids, then soup and crackers, and gradually work up to solid foods.   3) Please notify your doctor immediately if you have any unusual bleeding, trouble breathing, redness and pain at the surgery site, drainage, fever, or pain not relieved by medication.    4) Additional Instructions: Pt cay remove bandaids Thursday and shower.  Take it easy until Thursday then may resume normal activities.        Please contact your physician with any problems or Same Day Surgery at [phone], Monday through Friday 6 am to 4 pm, or [place] at [facility] number at [phone].Take it easy today and tomorrow then resume normal activities on Thursday.  Remove Band-Aids on Thursday then okay to shower and pain medicine as directed

## 2018-04-25 ENCOUNTER — Encounter: Payer: Self-pay | Admitting: Orthopedic Surgery

## 2018-04-27 LAB — SURGICAL PATHOLOGY

## 2018-05-07 NOTE — Anesthesia Postprocedure Evaluation (Signed)
Anesthesia Post Note  Patient: Bridget Mendez  Procedure(s) Performed: KYPHOPLASTY (N/A )  Patient location during evaluation: PACU Anesthesia Type: General Level of consciousness: awake and alert and oriented Pain management: pain level controlled Vital Signs Assessment: post-procedure vital signs reviewed and stable Respiratory status: spontaneous breathing Cardiovascular status: blood pressure returned to baseline Anesthetic complications: no     Last Vitals:  Vitals:   04/24/18 1537 04/24/18 1701  BP: (!) 193/91 (!) 159/77  Pulse: 82 87  Resp: 15 18  Temp:  (!) 36.3 C  SpO2: 99% 97%    Last Pain:  Vitals:   04/24/18 1701  TempSrc: Temporal  PainSc:                  Rea Reser

## 2018-05-09 DIAGNOSIS — S32020G Wedge compression fracture of second lumbar vertebra, subsequent encounter for fracture with delayed healing: Secondary | ICD-10-CM | POA: Diagnosis not present

## 2018-05-14 DIAGNOSIS — S32020G Wedge compression fracture of second lumbar vertebra, subsequent encounter for fracture with delayed healing: Secondary | ICD-10-CM | POA: Diagnosis not present

## 2018-05-14 DIAGNOSIS — M6281 Muscle weakness (generalized): Secondary | ICD-10-CM | POA: Diagnosis not present

## 2018-05-14 DIAGNOSIS — R262 Difficulty in walking, not elsewhere classified: Secondary | ICD-10-CM | POA: Diagnosis not present

## 2018-05-16 DIAGNOSIS — S32020G Wedge compression fracture of second lumbar vertebra, subsequent encounter for fracture with delayed healing: Secondary | ICD-10-CM | POA: Diagnosis not present

## 2018-05-16 DIAGNOSIS — R262 Difficulty in walking, not elsewhere classified: Secondary | ICD-10-CM | POA: Diagnosis not present

## 2018-05-16 DIAGNOSIS — M6281 Muscle weakness (generalized): Secondary | ICD-10-CM | POA: Diagnosis not present

## 2018-05-18 DIAGNOSIS — R262 Difficulty in walking, not elsewhere classified: Secondary | ICD-10-CM | POA: Diagnosis not present

## 2018-05-18 DIAGNOSIS — M6281 Muscle weakness (generalized): Secondary | ICD-10-CM | POA: Diagnosis not present

## 2018-05-18 DIAGNOSIS — S32020G Wedge compression fracture of second lumbar vertebra, subsequent encounter for fracture with delayed healing: Secondary | ICD-10-CM | POA: Diagnosis not present

## 2018-05-21 DIAGNOSIS — M6281 Muscle weakness (generalized): Secondary | ICD-10-CM | POA: Diagnosis not present

## 2018-05-21 DIAGNOSIS — R262 Difficulty in walking, not elsewhere classified: Secondary | ICD-10-CM | POA: Diagnosis not present

## 2018-05-21 DIAGNOSIS — S32020G Wedge compression fracture of second lumbar vertebra, subsequent encounter for fracture with delayed healing: Secondary | ICD-10-CM | POA: Diagnosis not present

## 2018-05-23 DIAGNOSIS — R262 Difficulty in walking, not elsewhere classified: Secondary | ICD-10-CM | POA: Diagnosis not present

## 2018-05-23 DIAGNOSIS — M6281 Muscle weakness (generalized): Secondary | ICD-10-CM | POA: Diagnosis not present

## 2018-05-23 DIAGNOSIS — S32020G Wedge compression fracture of second lumbar vertebra, subsequent encounter for fracture with delayed healing: Secondary | ICD-10-CM | POA: Diagnosis not present

## 2018-05-25 DIAGNOSIS — M6281 Muscle weakness (generalized): Secondary | ICD-10-CM | POA: Diagnosis not present

## 2018-05-25 DIAGNOSIS — R262 Difficulty in walking, not elsewhere classified: Secondary | ICD-10-CM | POA: Diagnosis not present

## 2018-05-25 DIAGNOSIS — S32020G Wedge compression fracture of second lumbar vertebra, subsequent encounter for fracture with delayed healing: Secondary | ICD-10-CM | POA: Diagnosis not present

## 2018-05-28 DIAGNOSIS — M6281 Muscle weakness (generalized): Secondary | ICD-10-CM | POA: Diagnosis not present

## 2018-05-28 DIAGNOSIS — S32020G Wedge compression fracture of second lumbar vertebra, subsequent encounter for fracture with delayed healing: Secondary | ICD-10-CM | POA: Diagnosis not present

## 2018-05-28 DIAGNOSIS — R262 Difficulty in walking, not elsewhere classified: Secondary | ICD-10-CM | POA: Diagnosis not present

## 2018-05-29 ENCOUNTER — Other Ambulatory Visit: Payer: Self-pay

## 2018-05-30 DIAGNOSIS — M6281 Muscle weakness (generalized): Secondary | ICD-10-CM | POA: Diagnosis not present

## 2018-05-30 DIAGNOSIS — R262 Difficulty in walking, not elsewhere classified: Secondary | ICD-10-CM | POA: Diagnosis not present

## 2018-05-30 DIAGNOSIS — S32020G Wedge compression fracture of second lumbar vertebra, subsequent encounter for fracture with delayed healing: Secondary | ICD-10-CM | POA: Diagnosis not present

## 2018-05-31 ENCOUNTER — Encounter: Payer: Self-pay | Admitting: Internal Medicine

## 2018-05-31 DIAGNOSIS — K219 Gastro-esophageal reflux disease without esophagitis: Secondary | ICD-10-CM | POA: Diagnosis not present

## 2018-05-31 DIAGNOSIS — M810 Age-related osteoporosis without current pathological fracture: Secondary | ICD-10-CM | POA: Diagnosis not present

## 2018-06-06 ENCOUNTER — Telehealth: Payer: Self-pay | Admitting: Internal Medicine

## 2018-06-06 NOTE — Telephone Encounter (Signed)
I think she needs to be evaluated for this. If she can't be seen by ortho, maybe she should go to [facility]

## 2018-06-06 NOTE — Telephone Encounter (Signed)
Spoke to pt. She said it is actually new pain just above the kyphoplasty site. Thinks she may have another compression fracture , Pain radiates to the front under the breast.

## 2018-06-06 NOTE — Telephone Encounter (Signed)
Spoke to pt. She said she will hold off until Monday when Dr Silvio Pate is back. She may call Dr Rudene Christians if she continues to have pain.

## 2018-06-06 NOTE — Telephone Encounter (Signed)
Pt called office stating she was told to take the hydrocodone every 6 hours, but the pt says she feels with the pain she is experiencing she should take it every 4-5 hours. Please advise

## 2018-06-06 NOTE — Telephone Encounter (Signed)
Is this in relation to her Kyphoplasty? If so, she needs to contact Dr. Rudene Christians.

## 2018-06-10 NOTE — Telephone Encounter (Signed)
Please have Bridget Mendez check here Monday morning---can get x-ray prn

## 2018-06-11 NOTE — Telephone Encounter (Signed)
Left detailed message for Luellen Pucker at [facility]

## 2018-06-12 NOTE — Telephone Encounter (Signed)
Okay--good to hear Rollene Fare is schedule to see her this month anyway (if no acute issues)

## 2018-06-12 NOTE — Telephone Encounter (Signed)
Bridget Mendez is calling back and stated the patient is fine and don't know why she called.

## 2018-07-06 ENCOUNTER — Encounter: Payer: Self-pay | Admitting: Internal Medicine

## 2018-07-06 ENCOUNTER — Ambulatory Visit: Payer: Medicare Other | Admitting: Internal Medicine

## 2018-07-06 DIAGNOSIS — F39 Unspecified mood [affective] disorder: Secondary | ICD-10-CM | POA: Diagnosis not present

## 2018-07-06 DIAGNOSIS — M159 Polyosteoarthritis, unspecified: Secondary | ICD-10-CM

## 2018-07-06 DIAGNOSIS — I471 Supraventricular tachycardia: Secondary | ICD-10-CM | POA: Insufficient documentation

## 2018-07-06 DIAGNOSIS — M8000XG Age-related osteoporosis with current pathological fracture, unspecified site, subsequent encounter for fracture with delayed healing: Secondary | ICD-10-CM

## 2018-07-06 DIAGNOSIS — S32020K Wedge compression fracture of second lumbar vertebra, subsequent encounter for fracture with nonunion: Secondary | ICD-10-CM

## 2018-07-06 DIAGNOSIS — M150 Primary generalized (osteo)arthritis: Secondary | ICD-10-CM | POA: Diagnosis not present

## 2018-07-06 DIAGNOSIS — K219 Gastro-esophageal reflux disease without esophagitis: Secondary | ICD-10-CM

## 2018-07-06 NOTE — Assessment & Plan Note (Signed)
Pain controlled with Glucosamine-Chondroitin, Tylenol and Norco

## 2018-07-06 NOTE — Assessment & Plan Note (Signed)
Continue Metoprolol 

## 2018-07-06 NOTE — Assessment & Plan Note (Signed)
Controlled off meds  Will monitor 

## 2018-07-06 NOTE — Progress Notes (Signed)
Subjective:    Patient ID: Bridget Mendez, female    DOB: September 03, 1919, 84 y.o.   MRN: [id number]  HPI  Saw resident in [address] for routine visit Reviewed with RN, no new concerns Resident denies concerns at this time Sleeps well, appetite great. Weight up 8 lbs since last visit Independent with ADL's, walks with rolling walker Bowels okay, no urinary incontinence  Osteoporosis: She is taking Actonel and Vit D as prescribed.  Osteoarthritis: Mainly in her lower back. She takes Glucosamine-Chondroitin, Tylenol as prescribed with good relief. She takes Norco as needed about 2 x day.  Lumbar Compression Fracture: Pain resolved after recent kyphoplasty done by Dr. Rudene Christians 04/2018.  GERD: Currently not an issue. Not taking any medication for this at this time.  Anxiety and Depression: She is enjoying herself at [facility]. Mood stable off meds.  Hx of SVT: Controlled on Metoprolol. She no longer follows with Dr. Rockey Situ.  Review of Systems      Past Medical History:  Diagnosis Date  . Allergy   . Anxiety   . Depression   . GERD (gastroesophageal reflux disease)   . Lumbar compression fracture ([place]) 04/2018  . Osteoarthritis   . Osteoporosis     Current Outpatient Medications  Medication Sig Dispense Refill  . acetaminophen (TYLENOL) 325 MG tablet Take 650 mg by mouth 2 (two) times daily.    Marland Kitchen alendronate (FOSAMAX) 70 MG tablet Take 70 mg by mouth once a week. Take with a full glass of water on an empty stomach.    . cholecalciferol (VITAMIN D3) 25 MCG (1000 UT) tablet Take 1,000 Units by mouth daily.    Marland Kitchen glucosamine-chondroitin 500-400 MG tablet Take 1 tablet by mouth daily.    Marland Kitchen HYDROcodone-acetaminophen (NORCO) 5-325 MG tablet Take 1 tablet by mouth every 6 (six) hours as needed. 15 tablet 0  . methylcellulose oral powder Take 1 packet by mouth daily.     . metoprolol succinate (TOPROL-XL) 25 MG 24 hr tablet Take 1 tablet (25 mg total) by mouth daily. Take with or  immediately following a meal. 90 tablet 1  . Multiple Vitamin (MULTIVITAMIN) tablet Take 1 tablet by mouth daily.     . Probiotic Product (PROBIOTIC DAILY) CAPS Take 1 capsule by mouth daily.     Marland Kitchen ALPRAZolam (XANAX) 0.5 MG tablet TAKE 1/2 TO 1 TABLET BY MOUTH TWICE DAILY AS NEEDED FOR ANXIETY (Patient not taking: Reported on 07/06/2018) 60 tablet 0  . amoxicillin (AMOXIL) 500 MG capsule Take 2,000 mg by mouth as needed (prior to dental appointment).    . calcium carbonate (TUMS EX) 750 MG chewable tablet Chew 1-2 tablets by mouth daily as needed.      No current facility-administered medications for this visit.     No Known Allergies  Family History  Problem Relation Age of Onset  . Parkinsonism Mother   . Cancer Maternal Grandmother     Social History   Socioeconomic History  . Marital status: Widowed    Spouse name: Not on file  . Number of children: 2  . Years of education: Not on file  . Highest education level: Not on file  Occupational History  . Occupation: retired  Scientific laboratory technician  . Financial resource strain: Not on file  . Food insecurity:    Worry: Not on file    Inability: Not on file  . Transportation needs:    Medical: Not on file    Non-medical: Not on file  Tobacco Use  . Smoking status: Former Smoker    Packs/day: 1.00    Types: Cigarettes    Last attempt to quit: 07/11/1988    Years since quitting: 30.0  . Smokeless tobacco: Never Used  Substance and Sexual Activity  . Alcohol use: Yes    Comment: 2 drinks in the evening  . Drug use: No  . Sexual activity: Not on file  Lifestyle  . Physical activity:    Days per week: Not on file    Minutes per session: Not on file  . Stress: Not on file  Relationships  . Social connections:    Talks on phone: Not on file    Gets together: Not on file    Attends religious service: Not on file    Active member of club or organization: Not on file    Attends meetings of clubs or organizations: Not on file     Relationship status: Not on file  . Intimate partner violence:    Fear of current or ex partner: Not on file    Emotionally abused: Not on file    Physically abused: Not on file    Forced sexual activity: Not on file  Other Topics Concern  . Not on file  Social History Narrative   Has living will   Daughter, Bridget Mendez, is health care POA--- son Bridget Mendez is alternate   Has DNR already--reviewed   No tube feeds if cognitively aware        Constitutional: Denies fever, malaise, fatigue, headache or abrupt weight changes.  HEENT: Denies eye pain, eye redness, ear pain, ringing in the ears, wax buildup, runny nose, nasal congestion, bloody nose, or sore throat. Respiratory: Denies difficulty breathing, shortness of breath, cough or sputum production.   Cardiovascular: Denies chest pain, chest tightness, palpitations or swelling in the hands or feet.  Gastrointestinal: Denies abdominal pain, bloating, constipation, diarrhea or blood in the stool.  GU: Denies urgency, frequency, pain with urination, burning sensation, blood in urine, odor or discharge. Musculoskeletal: Pt reports intermittent low back pain. Denies decrease in range of motion, difficulty with gait, muscle pain or joint swelling.  Skin: Denies redness, rashes, lesions or ulcercations.  Neurological: Denies dizziness, difficulty with memory, difficulty with speech or problems with balance and coordination.  Psych: Pt has a history of anxiety and depression. Denies SI/HI.  No other specific complaints in a complete review of systems (except as listed in HPI above).  Objective:   Physical Exam  BP 137/84   Pulse 81   Temp 98 F (36.7 C)   Resp 18   Wt 168 lb 4.8 oz (76.3 kg)   BMI 29.81 kg/m  Wt Readings from Last 3 Encounters:  07/06/18 168 lb 4.8 oz (76.3 kg)  04/24/18 160 lb (72.6 kg)  03/01/18 160 lb (72.6 kg)    General: Appears her stated age, well developed, well nourished in NAD. Skin: Warm, dry and intact. No  ulcerations noted. Neck:  Neck supple, trachea midline. No masses, lumps or thyromegaly present.  Cardiovascular: Normal rate and rhythm. S1,S2 noted.  No murmur, rubs or gallops noted. No JVD or BLE edema.  Pulmonary/Chest: Normal effort and positive vesicular breath sounds. No respiratory distress. No wheezes, rales or ronchi noted.  Abdomen: Soft and nontender. Normal bowel sounds.  Musculoskeletal: No difficulty with gait with use of rolling walker.  Neurological: Alert and oriented.  Psychiatric: Mood and affect normal. Behavior is normal. Judgment and thought content normal.  BMET    Component Value Date/Time   NA 140 04/24/2018 1311   K 4.1 04/24/2018 1311   CL 106 04/24/2018 1311   CO2 23 04/24/2018 1311   GLUCOSE 110 (H) 04/24/2018 1311   BUN 19 04/24/2018 1311   CREATININE 0.92 04/24/2018 1311   CALCIUM 9.0 04/24/2018 1311   GFRNONAA 51 (L) 04/24/2018 1311   GFRAA 59 (L) 04/24/2018 1311    Lipid Panel  No results found for: CHOL, TRIG, HDL, CHOLHDL, VLDL, LDLCALC  CBC    Component Value Date/Time   WBC 6.6 04/24/2018 1311   RBC 4.02 04/24/2018 1311   HGB 12.7 04/24/2018 1311   HCT 39.7 04/24/2018 1311   PLT 371 04/24/2018 1311   MCV 98.8 04/24/2018 1311   MCH 31.6 04/24/2018 1311   MCHC 32.0 04/24/2018 1311   RDW 14.0 04/24/2018 1311   LYMPHSABS 2.1 12/26/2016 1127   MONOABS 1.0 12/26/2016 1127   EOSABS 0.1 12/26/2016 1127   BASOSABS 0.0 12/26/2016 1127    Hgb A1C No results found for: HGBA1C          Assessment & Plan:

## 2018-07-06 NOTE — Assessment & Plan Note (Signed)
Continue Actonel and Vit D Encouraged weight bearing exercise daily

## 2018-07-13 DIAGNOSIS — H40003 Preglaucoma, unspecified, bilateral: Secondary | ICD-10-CM | POA: Diagnosis not present

## 2018-08-08 DIAGNOSIS — H2511 Age-related nuclear cataract, right eye: Secondary | ICD-10-CM | POA: Diagnosis not present

## 2018-08-09 ENCOUNTER — Encounter: Payer: Self-pay | Admitting: *Deleted

## 2018-08-09 ENCOUNTER — Other Ambulatory Visit: Payer: Self-pay

## 2018-08-09 NOTE — Discharge Instructions (Signed)

## 2018-08-15 ENCOUNTER — Ambulatory Visit
Admission: RE | Admit: 2018-08-15 | Discharge: 2018-08-15 | Disposition: A | Discharge status: Home / Self Care | Payer: Medicare Other | Attending: Ophthalmology | Admitting: Ophthalmology

## 2018-08-15 ENCOUNTER — Encounter
Admission: RE | Disposition: A | Discharge status: Home / Self Care | Payer: Self-pay | Source: Home / Self Care | Attending: Ophthalmology

## 2018-08-15 ENCOUNTER — Ambulatory Visit: Payer: Medicare Other | Admitting: Anesthesiology

## 2018-08-15 DIAGNOSIS — M810 Age-related osteoporosis without current pathological fracture: Secondary | ICD-10-CM | POA: Insufficient documentation

## 2018-08-15 DIAGNOSIS — Z96659 Presence of unspecified artificial knee joint: Secondary | ICD-10-CM | POA: Insufficient documentation

## 2018-08-15 DIAGNOSIS — I10 Essential (primary) hypertension: Secondary | ICD-10-CM | POA: Insufficient documentation

## 2018-08-15 DIAGNOSIS — Z7983 Long term (current) use of bisphosphonates: Secondary | ICD-10-CM | POA: Diagnosis not present

## 2018-08-15 DIAGNOSIS — Z87891 Personal history of nicotine dependence: Secondary | ICD-10-CM | POA: Diagnosis not present

## 2018-08-15 DIAGNOSIS — H2511 Age-related nuclear cataract, right eye: Secondary | ICD-10-CM | POA: Insufficient documentation

## 2018-08-15 DIAGNOSIS — Z96649 Presence of unspecified artificial hip joint: Secondary | ICD-10-CM | POA: Insufficient documentation

## 2018-08-15 DIAGNOSIS — Z79899 Other long term (current) drug therapy: Secondary | ICD-10-CM | POA: Diagnosis not present

## 2018-08-15 DIAGNOSIS — H25811 Combined forms of age-related cataract, right eye: Secondary | ICD-10-CM | POA: Diagnosis not present

## 2018-08-15 HISTORY — PX: CATARACT EXTRACTION W/PHACO: SHX586

## 2018-08-15 HISTORY — DX: Ventricular premature depolarization: I49.3

## 2018-08-15 HISTORY — DX: Presence of dental prosthetic device (complete) (partial): Z97.2

## 2018-08-15 SURGERY — PHACOEMULSIFICATION, CATARACT, WITH IOL INSERTION
Anesthesia: Monitor Anesthesia Care | Site: Eye | Laterality: Right

## 2018-08-15 MED ORDER — MOXIFLOXACIN HCL 0.5 % OP SOLN
1.0000 [drp] | OPHTHALMIC | Status: DC | PRN
  Administered 2018-08-15 (×3): 1 [drp] via OPHTHALMIC

## 2018-08-15 MED ORDER — MIDAZOLAM HCL 2 MG/2ML IJ SOLN
INTRAMUSCULAR | Status: DC | PRN
  Administered 2018-08-15: 0.5 mg via INTRAVENOUS

## 2018-08-15 MED ORDER — TETRACAINE HCL 0.5 % OP SOLN
1.0000 [drp] | OPHTHALMIC | Status: DC | PRN
  Administered 2018-08-15 (×3): 1 [drp] via OPHTHALMIC

## 2018-08-15 MED ORDER — LIDOCAINE HCL (PF) 2 % IJ SOLN
INTRAOCULAR | Status: DC | PRN
  Administered 2018-08-15: 1 mL

## 2018-08-15 MED ORDER — BRIMONIDINE TARTRATE-TIMOLOL 0.2-0.5 % OP SOLN
OPHTHALMIC | Status: DC | PRN
  Administered 2018-08-15: 1 [drp] via OPHTHALMIC

## 2018-08-15 MED ORDER — CEFUROXIME OPHTHALMIC INJECTION 1 MG/0.1 ML
INJECTION | OPHTHALMIC | Status: DC | PRN
  Administered 2018-08-15: 0.1 mL via INTRACAMERAL

## 2018-08-15 MED ORDER — ERYTHROMYCIN 5 MG/GM OP OINT
TOPICAL_OINTMENT | OPHTHALMIC | Status: DC | PRN
  Administered 2018-08-15: 1 via OPHTHALMIC

## 2018-08-15 MED ORDER — EPINEPHRINE PF 1 MG/ML IJ SOLN
INTRAOCULAR | Status: DC | PRN
  Administered 2018-08-15: 91 mL via OPHTHALMIC

## 2018-08-15 MED ORDER — ARMC OPHTHALMIC DILATING DROPS
1.0000 "application " | OPHTHALMIC | Status: DC | PRN
  Administered 2018-08-15 (×3): 1 via OPHTHALMIC

## 2018-08-15 MED ORDER — NA HYALUR & NA CHOND-NA HYALUR 0.4-0.35 ML IO KIT
PACK | INTRAOCULAR | Status: DC | PRN
  Administered 2018-08-15: 1 mL via INTRAOCULAR

## 2018-08-15 MED ORDER — FENTANYL CITRATE (PF) 100 MCG/2ML IJ SOLN
INTRAMUSCULAR | Status: DC | PRN
  Administered 2018-08-15: 50 ug via INTRAVENOUS

## 2018-08-15 SURGICAL SUPPLY — 26 items
CANNULA ANT/CHMB 27G (MISCELLANEOUS) ×1 IMPLANT
CANNULA ANT/CHMB 27GA (MISCELLANEOUS) ×3 IMPLANT
GLOVE SURG LX 7.5 STRW (GLOVE) ×2
GLOVE SURG LX STRL 7.5 STRW (GLOVE) ×1 IMPLANT
GLOVE SURG TRIUMPH 8.0 PF LTX (GLOVE) ×3 IMPLANT
GOWN STRL REUS W/ TWL LRG LVL3 (GOWN DISPOSABLE) ×2 IMPLANT
GOWN STRL REUS W/TWL LRG LVL3 (GOWN DISPOSABLE) ×4
LENS IOL TECNIS ITEC 21.0 (Intraocular Lens) ×2 IMPLANT
MARKER SKIN DUAL TIP RULER LAB (MISCELLANEOUS) ×3 IMPLANT
NDL FILTER BLUNT 18X1 1/2 (NEEDLE) ×1 IMPLANT
NDL RETROBULBAR .5 NSTRL (NEEDLE) IMPLANT
NEEDLE FILTER BLUNT 18X 1/2SAF (NEEDLE) ×2
NEEDLE FILTER BLUNT 18X1 1/2 (NEEDLE) ×1 IMPLANT
PACK CATARACT BRASINGTON (MISCELLANEOUS) ×3 IMPLANT
PACK EYE AFTER SURG (MISCELLANEOUS) ×3 IMPLANT
PACK OPTHALMIC (MISCELLANEOUS) ×3 IMPLANT
RING MALYGIN 7.0 (MISCELLANEOUS) IMPLANT
SUT ETHILON 10-0 CS-B-6CS-B-6 (SUTURE)
SUT VICRYL  9 0 (SUTURE)
SUT VICRYL 9 0 (SUTURE) IMPLANT
SUTURE EHLN 10-0 CS-B-6CS-B-6 (SUTURE) IMPLANT
SYR 3ML LL SCALE MARK (SYRINGE) ×3 IMPLANT
SYR 5ML LL (SYRINGE) ×3 IMPLANT
SYR TB 1ML LUER SLIP (SYRINGE) ×3 IMPLANT
WATER STERILE IRR 500ML POUR (IV SOLUTION) ×3 IMPLANT
WIPE NON LINTING 3.25X3.25 (MISCELLANEOUS) ×3 IMPLANT

## 2018-08-15 NOTE — Anesthesia Postprocedure Evaluation (Signed)
Anesthesia Post Note  Patient: Bridget Mendez  Procedure(s) Performed: CATARACT EXTRACTION PHACO AND INTRAOCULAR LENS PLACEMENT (IOC)  RIGHT (Right Eye)  Patient location during evaluation: PACU Anesthesia Type: MAC Level of consciousness: awake and alert Pain management: pain level controlled Vital Signs Assessment: post-procedure vital signs reviewed and stable Respiratory status: spontaneous breathing Cardiovascular status: stable Anesthetic complications: no    Angeles Zehner, III,  Brently Voorhis D

## 2018-08-15 NOTE — Anesthesia Preprocedure Evaluation (Signed)
Anesthesia Evaluation   Patient awake    Reviewed: Allergy & Precautions, H&P , NPO status , Patient's Chart, lab work & pertinent test results  History of Anesthesia Complications Negative for: history of anesthetic complications  Airway Mallampati: II  TM Distance: >3 FB Neck ROM: full    Dental no notable dental hx.    Pulmonary former smoker,    Pulmonary exam normal breath sounds clear to auscultation       Cardiovascular negative cardio ROS Normal cardiovascular exam Rhythm:regular Rate:Normal     Neuro/Psych    GI/Hepatic Neg liver ROS, Medicated,  Endo/Other  negative endocrine ROS  Renal/GU negative Renal ROS     Musculoskeletal   Abdominal   Peds  Hematology negative hematology ROS (+)   Anesthesia Other Findings   Reproductive/Obstetrics                             Anesthesia Physical Anesthesia Plan  ASA: II  Anesthesia Plan: MAC   Post-op Pain Management:    Induction:   PONV Risk Score and Plan:   Airway Management Planned:   Additional Equipment:   Intra-op Plan:   Post-operative Plan:   Informed Consent: I have reviewed the patients History and Physical, chart, labs and discussed the procedure including the risks, benefits and alternatives for the proposed anesthesia with the patient or authorized representative who has indicated his/her understanding and acceptance.       Plan Discussed with:   Anesthesia Plan Comments:         Anesthesia Quick Evaluation

## 2018-08-15 NOTE — Op Note (Signed)
LOCATION:  [facility]   PREOPERATIVE DIAGNOSIS:    Nuclear sclerotic cataract right eye. H25.11   POSTOPERATIVE DIAGNOSIS:  Nuclear sclerotic cataract right eye.     PROCEDURE:  Phacoemusification with posterior chamber intraocular lens placement of the right eye   LENS:   Implant Name Type Inv. Item Serial No. Manufacturer Lot No. LRB No. Used  J &amp; J TECNIS 1 PIECE IOL Intraocular Lens  [id number] [employer]  Right 1     PCB00 21.0 D   ULTRASOUND TIME: 24 % of 1 minutes, 40 seconds.  CDE 24.2   SURGEON:  Wyonia Hough, MD   ANESTHESIA:  Topical with tetracaine drops and 2% Xylocaine jelly, augmented with 1% preservative-free intracameral lidocaine.    COMPLICATIONS:  None.   DESCRIPTION OF PROCEDURE:  The patient was identified in the holding room and transported to the operating room and placed in the supine position under the operating microscope.  The right eye was identified as the operative eye and it was prepped and draped in the usual sterile ophthalmic fashion.   A 1 millimeter clear-corneal paracentesis was made at the 12:00 position.  0.5 ml of preservative-free 1% lidocaine was injected into the anterior chamber. The anterior chamber was filled with Viscoat viscoelastic.  A 2.4 millimeter keratome was used to make a near-clear corneal incision at the 9:00 position.  A curvilinear capsulorrhexis was made with a cystotome and capsulorrhexis forceps.  Balanced salt solution was used to hydrodissect and hydrodelineate the nucleus.   Phacoemulsification was then used in stop and chop fashion to remove the lens nucleus and epinucleus.  The remaining cortex was then removed using the irrigation and aspiration handpiece. Provisc was then placed into the capsular bag to distend it for lens placement.  A lens was then injected into the capsular bag.  The remaining viscoelastic was aspirated.   Wounds were hydrated with balanced salt solution.  The  anterior chamber was inflated to a physiologic pressure with balanced salt solution.  No wound leaks were noted. Cefuroxime 0.1 ml of a 10mg /ml solution was injected into the anterior chamber for a dose of 1 mg of intracameral antibiotic at the completion of the case.   Timolol and Brimonidine drops were applied to the eye.  The patient was taken to the recovery room in stable condition without complications of anesthesia or surgery.   Shayanna Thatch 08/15/2018, 11:47 AM

## 2018-08-15 NOTE — Anesthesia Procedure Notes (Signed)
Procedure Name: MAC Performed by: Chrishawn Kring, CRNA Pre-anesthesia Checklist: Patient identified, Emergency Drugs available, Suction available, Timeout performed and Patient being monitored Patient Re-evaluated:Patient Re-evaluated prior to induction Oxygen Delivery Method: Nasal cannula Placement Confirmation: positive ETCO2       

## 2018-08-15 NOTE — H&P (Signed)

## 2018-08-15 NOTE — Transfer of Care (Signed)
Immediate Anesthesia Transfer of Care Note  Patient: Bridget Mendez  Procedure(s) Performed: CATARACT EXTRACTION PHACO AND INTRAOCULAR LENS PLACEMENT (IOC)  RIGHT (Right Eye)  Patient Location: PACU  Anesthesia Type: MAC  Level of Consciousness: awake, alert  and patient cooperative  Airway and Oxygen Therapy: Patient Spontanous Breathing and Patient connected to supplemental oxygen  Post-op Assessment: Post-op Vital signs reviewed, Patient's Cardiovascular Status Stable, Respiratory Function Stable, Patent Airway and No signs of Nausea or vomiting  Post-op Vital Signs: Reviewed and stable  Complications: No apparent anesthesia complications

## 2018-08-24 DIAGNOSIS — H2512 Age-related nuclear cataract, left eye: Secondary | ICD-10-CM | POA: Diagnosis not present

## 2018-08-31 ENCOUNTER — Encounter: Payer: Self-pay | Admitting: *Deleted

## 2018-08-31 ENCOUNTER — Other Ambulatory Visit: Payer: Self-pay

## 2018-09-05 NOTE — Discharge Instructions (Signed)

## 2018-09-12 ENCOUNTER — Ambulatory Visit: Payer: Medicare Other | Admitting: Anesthesiology

## 2018-09-12 ENCOUNTER — Encounter
Admission: RE | Disposition: A | Discharge status: Home / Self Care | Payer: Self-pay | Source: Home / Self Care | Attending: Ophthalmology

## 2018-09-12 ENCOUNTER — Ambulatory Visit
Admission: RE | Admit: 2018-09-12 | Discharge: 2018-09-12 | Disposition: A | Discharge status: Home / Self Care | Payer: Medicare Other | Attending: Ophthalmology | Admitting: Ophthalmology

## 2018-09-12 DIAGNOSIS — K219 Gastro-esophageal reflux disease without esophagitis: Secondary | ICD-10-CM | POA: Insufficient documentation

## 2018-09-12 DIAGNOSIS — Z87891 Personal history of nicotine dependence: Secondary | ICD-10-CM | POA: Insufficient documentation

## 2018-09-12 DIAGNOSIS — Z96651 Presence of right artificial knee joint: Secondary | ICD-10-CM | POA: Insufficient documentation

## 2018-09-12 DIAGNOSIS — M810 Age-related osteoporosis without current pathological fracture: Secondary | ICD-10-CM | POA: Diagnosis not present

## 2018-09-12 DIAGNOSIS — I10 Essential (primary) hypertension: Secondary | ICD-10-CM | POA: Diagnosis not present

## 2018-09-12 DIAGNOSIS — Z7983 Long term (current) use of bisphosphonates: Secondary | ICD-10-CM | POA: Insufficient documentation

## 2018-09-12 DIAGNOSIS — H2512 Age-related nuclear cataract, left eye: Secondary | ICD-10-CM | POA: Diagnosis not present

## 2018-09-12 DIAGNOSIS — H25812 Combined forms of age-related cataract, left eye: Secondary | ICD-10-CM | POA: Diagnosis not present

## 2018-09-12 DIAGNOSIS — M1990 Unspecified osteoarthritis, unspecified site: Secondary | ICD-10-CM | POA: Insufficient documentation

## 2018-09-12 DIAGNOSIS — Z96641 Presence of right artificial hip joint: Secondary | ICD-10-CM | POA: Diagnosis not present

## 2018-09-12 HISTORY — PX: CATARACT EXTRACTION W/PHACO: SHX586

## 2018-09-12 SURGERY — PHACOEMULSIFICATION, CATARACT, WITH IOL INSERTION
Anesthesia: Monitor Anesthesia Care | Site: Eye | Laterality: Left

## 2018-09-12 MED ORDER — FENTANYL CITRATE (PF) 100 MCG/2ML IJ SOLN
INTRAMUSCULAR | Status: DC | PRN
  Administered 2018-09-12: 50 ug via INTRAVENOUS

## 2018-09-12 MED ORDER — TETRACAINE HCL 0.5 % OP SOLN
1.0000 [drp] | OPHTHALMIC | Status: DC | PRN
  Administered 2018-09-12 (×3): 1 [drp] via OPHTHALMIC

## 2018-09-12 MED ORDER — BRIMONIDINE TARTRATE-TIMOLOL 0.2-0.5 % OP SOLN
OPHTHALMIC | Status: DC | PRN
  Administered 2018-09-12: 1 [drp] via OPHTHALMIC

## 2018-09-12 MED ORDER — LIDOCAINE HCL (PF) 2 % IJ SOLN
INTRAOCULAR | Status: DC | PRN
  Administered 2018-09-12: 2 mL

## 2018-09-12 MED ORDER — ARMC OPHTHALMIC DILATING DROPS
1.0000 "application " | OPHTHALMIC | Status: DC | PRN
  Administered 2018-09-12 (×3): 1 via OPHTHALMIC

## 2018-09-12 MED ORDER — CEFUROXIME OPHTHALMIC INJECTION 1 MG/0.1 ML
INJECTION | OPHTHALMIC | Status: DC | PRN
  Administered 2018-09-12: 0.1 mL via INTRACAMERAL

## 2018-09-12 MED ORDER — MOXIFLOXACIN HCL 0.5 % OP SOLN
1.0000 [drp] | OPHTHALMIC | Status: DC | PRN
  Administered 2018-09-12 (×3): 1 [drp] via OPHTHALMIC

## 2018-09-12 MED ORDER — MIDAZOLAM HCL 2 MG/2ML IJ SOLN
INTRAMUSCULAR | Status: DC | PRN
  Administered 2018-09-12: 1 mg via INTRAVENOUS

## 2018-09-12 MED ORDER — NA HYALUR & NA CHOND-NA HYALUR 0.4-0.35 ML IO KIT
PACK | INTRAOCULAR | Status: DC | PRN
  Administered 2018-09-12: 1 mL via INTRAOCULAR

## 2018-09-12 MED ORDER — EPINEPHRINE PF 1 MG/ML IJ SOLN
INTRAOCULAR | Status: DC | PRN
  Administered 2018-09-12: 65 mL via OPHTHALMIC

## 2018-09-12 SURGICAL SUPPLY — 20 items
CANNULA ANT/CHMB 27G (MISCELLANEOUS) ×1 IMPLANT
CANNULA ANT/CHMB 27GA (MISCELLANEOUS) ×3 IMPLANT
GLOVE SURG LX 7.5 STRW (GLOVE) ×2
GLOVE SURG LX STRL 7.5 STRW (GLOVE) ×1 IMPLANT
GLOVE SURG TRIUMPH 8.0 PF LTX (GLOVE) ×3 IMPLANT
GOWN STRL REUS W/ TWL LRG LVL3 (GOWN DISPOSABLE) ×2 IMPLANT
GOWN STRL REUS W/TWL LRG LVL3 (GOWN DISPOSABLE) ×4
LENS IOL TECNIS ITEC 22.0 (Intraocular Lens) ×2 IMPLANT
MARKER SKIN DUAL TIP RULER LAB (MISCELLANEOUS) ×3 IMPLANT
NDL FILTER BLUNT 18X1 1/2 (NEEDLE) ×1 IMPLANT
NEEDLE FILTER BLUNT 18X 1/2SAF (NEEDLE) ×2
NEEDLE FILTER BLUNT 18X1 1/2 (NEEDLE) ×1 IMPLANT
PACK CATARACT BRASINGTON (MISCELLANEOUS) ×3 IMPLANT
PACK EYE AFTER SURG (MISCELLANEOUS) ×3 IMPLANT
PACK OPTHALMIC (MISCELLANEOUS) ×3 IMPLANT
SYR 3ML LL SCALE MARK (SYRINGE) ×3 IMPLANT
SYR 5ML LL (SYRINGE) ×3 IMPLANT
SYR TB 1ML LUER SLIP (SYRINGE) ×3 IMPLANT
WATER STERILE IRR 500ML POUR (IV SOLUTION) ×3 IMPLANT
WIPE NON LINTING 3.25X3.25 (MISCELLANEOUS) ×3 IMPLANT

## 2018-09-12 NOTE — Transfer of Care (Signed)
Immediate Anesthesia Transfer of Care Note  Patient: Bridget Mendez  Procedure(s) Performed: CATARACT EXTRACTION PHACO AND INTRAOCULAR LENS PLACEMENT (IOC)  LEFT (Left Eye)  Patient Location: PACU  Anesthesia Type: MAC  Level of Consciousness: awake, alert  and patient cooperative  Airway and Oxygen Therapy: Patient Spontanous Breathing and Patient connected to supplemental oxygen  Post-op Assessment: Post-op Vital signs reviewed, Patient's Cardiovascular Status Stable, Respiratory Function Stable, Patent Airway and No signs of Nausea or vomiting  Post-op Vital Signs: Reviewed and stable  Complications: No apparent anesthesia complications

## 2018-09-12 NOTE — Anesthesia Procedure Notes (Signed)
Procedure Name: MAC Performed by: Misaki Sozio M, CRNA Pre-anesthesia Checklist: Timeout performed, Patient being monitored, Suction available, Emergency Drugs available and Patient identified Patient Re-evaluated:Patient Re-evaluated prior to induction Oxygen Delivery Method: Nasal cannula       

## 2018-09-12 NOTE — Anesthesia Procedure Notes (Signed)
Procedure Name: MAC Performed by: Kewon Statler, CRNA Pre-anesthesia Checklist: Patient identified, Emergency Drugs available, Suction available, Timeout performed and Patient being monitored Patient Re-evaluated:Patient Re-evaluated prior to induction Oxygen Delivery Method: Nasal cannula Placement Confirmation: positive ETCO2       

## 2018-09-12 NOTE — H&P (Signed)
,  The History and Physical notes are on paper, have been signed, and are to be scanned. The patient remains stable and unchanged from the H&P.   Previous H&P reviewed, patient examined, and there are no changes.  The patient has a visually significant cataract interfering with his or her vision.  I attest that the following are true and accurate to the best of my knowledge: 1. The patient's impairment of visual function is believed not to be correctable with a tolerable change in glasses or contact lenses. 2. Cataract (in the operative eye) is believed to be significantly contributing to the patient's visual impairment. 3. The patient desires surgical correction; the risks, benefits, and alternatives have been explained, and questions have been answered to the patients satisfaction.  A reasonable expectation exists that cataract surgery will significantly improve both the visual and functional status of the patient.  Bridget Mendez 09/12/2018 10:06 AM

## 2018-09-12 NOTE — Anesthesia Preprocedure Evaluation (Signed)
Anesthesia Evaluation  Patient identified by MRN, date of birth, ID band Patient awake    Reviewed: Allergy & Precautions, H&P , NPO status , Patient's Chart, lab work & pertinent test results  History of Anesthesia Complications Negative for: history of anesthetic complications  Airway Mallampati: II  TM Distance: >3 FB Neck ROM: full    Dental no notable dental hx.    Pulmonary former smoker,    Pulmonary exam normal breath sounds clear to auscultation       Cardiovascular negative cardio ROS Normal cardiovascular exam Rhythm:regular Rate:Normal     Neuro/Psych negative neurological ROS     GI/Hepatic Neg liver ROS, Medicated,  Endo/Other  negative endocrine ROS  Renal/GU negative Renal ROS  negative genitourinary   Musculoskeletal   Abdominal   Peds  Hematology negative hematology ROS (+)   Anesthesia Other Findings   Reproductive/Obstetrics negative OB ROS                             Anesthesia Physical Anesthesia Plan  ASA: II  Anesthesia Plan: MAC   Post-op Pain Management:    Induction:   PONV Risk Score and Plan:   Airway Management Planned:   Additional Equipment:   Intra-op Plan:   Post-operative Plan:   Informed Consent: I have reviewed the patients History and Physical, chart, labs and discussed the procedure including the risks, benefits and alternatives for the proposed anesthesia with the patient or authorized representative who has indicated his/her understanding and acceptance.       Plan Discussed with:   Anesthesia Plan Comments:         Anesthesia Quick Evaluation

## 2018-09-12 NOTE — Anesthesia Procedure Notes (Signed)
Procedure Name: MAC Performed by: Cedrica Brune M, CRNA Pre-anesthesia Checklist: Timeout performed, Patient being monitored, Suction available, Emergency Drugs available and Patient identified Patient Re-evaluated:Patient Re-evaluated prior to induction Oxygen Delivery Method: Nasal cannula       

## 2018-09-12 NOTE — Op Note (Signed)
OPERATIVE NOTE  Bridget Mendez [id number] 09/12/2018   PREOPERATIVE DIAGNOSIS:  Nuclear sclerotic cataract left eye. H25.12   POSTOPERATIVE DIAGNOSIS:    Nuclear sclerotic cataract left eye.     PROCEDURE:  Phacoemusification with posterior chamber intraocular lens placement of the left eye   LENS:   Implant Name Type Inv. Item Serial No. Manufacturer Lot No. LRB No. Used  LENS IOL DIOP 22.0 - [id number] Intraocular Lens LENS IOL DIOP 22.0 [id number] AMO  Left 1        ULTRASOUND TIME: 20  % of 1 minutes 5 seconds, CDE 12.6  SURGEON:  Wyonia Hough, MD   ANESTHESIA:  Topical with tetracaine drops and 2% Xylocaine jelly, augmented with 1% preservative-free intracameral lidocaine.    COMPLICATIONS:  None.   DESCRIPTION OF PROCEDURE:  The patient was identified in the holding room and transported to the operating room and placed in the supine position under the operating microscope.  The left eye was identified as the operative eye and it was prepped and draped in the usual sterile ophthalmic fashion.   A 1 millimeter clear-corneal paracentesis was made at the 1:30 position.  0.5 ml of preservative-free 1% lidocaine was injected into the anterior chamber.  The anterior chamber was filled with Viscoat viscoelastic.  A 2.4 millimeter keratome was used to make a near-clear corneal incision at the 10:30 position.  .  A curvilinear capsulorrhexis was made with a cystotome and capsulorrhexis forceps.  Balanced salt solution was used to hydrodissect and hydrodelineate the nucleus.   Phacoemulsification was then used in stop and chop fashion to remove the lens nucleus and epinucleus.  The remaining cortex was then removed using the irrigation and aspiration handpiece. Provisc was then placed into the capsular bag to distend it for lens placement.  A lens was then injected into the capsular bag.  The remaining viscoelastic was aspirated.   Wounds were hydrated with balanced salt  solution.  The anterior chamber was inflated to a physiologic pressure with balanced salt solution.  No wound leaks were noted. Cefuroxime 0.1 ml of a 10mg /ml solution was injected into the anterior chamber for a dose of 1 mg of intracameral antibiotic at the completion of the case.   Timolol and Brimonidine drops were applied to the eye.  The patient was taken to the recovery room in stable condition without complications of anesthesia or surgery.  [name] 09/12/2018, 10:58 AM

## 2018-09-12 NOTE — Anesthesia Postprocedure Evaluation (Signed)
Anesthesia Post Note  Patient: Bridget Mendez  Procedure(s) Performed: CATARACT EXTRACTION PHACO AND INTRAOCULAR LENS PLACEMENT (IOC)  LEFT (Left Eye)  Patient location during evaluation: PACU Anesthesia Type: MAC Level of consciousness: awake and alert Pain management: pain level controlled Vital Signs Assessment: post-procedure vital signs reviewed and stable Respiratory status: spontaneous breathing Cardiovascular status: stable Anesthetic complications: no    Leshawn Straka, III,  Lavenia Stumpo D

## 2018-09-13 ENCOUNTER — Encounter: Payer: Self-pay | Admitting: Ophthalmology

## 2018-09-27 ENCOUNTER — Ambulatory Visit: Payer: Medicare Other | Admitting: Internal Medicine

## 2018-09-27 ENCOUNTER — Other Ambulatory Visit: Payer: Self-pay

## 2018-11-22 ENCOUNTER — Encounter: Payer: Self-pay | Admitting: Internal Medicine

## 2018-11-22 DIAGNOSIS — K219 Gastro-esophageal reflux disease without esophagitis: Secondary | ICD-10-CM | POA: Diagnosis not present

## 2018-11-22 LAB — BASIC METABOLIC PANEL: Creatinine: 1.2 — AB (ref 0.5–1.1)

## 2018-12-24 ENCOUNTER — Emergency Department
Admission: EM | Admit: 2018-12-24 | Discharge: 2018-12-24 | Disposition: A | Discharge status: Home / Self Care | Payer: Medicare Other | Attending: Student in an Organized Health Care Education/Training Program | Admitting: Student in an Organized Health Care Education/Training Program

## 2018-12-24 ENCOUNTER — Emergency Department: Payer: Medicare Other

## 2018-12-24 ENCOUNTER — Encounter: Payer: Self-pay | Admitting: Emergency Medicine

## 2018-12-24 ENCOUNTER — Other Ambulatory Visit: Payer: Self-pay

## 2018-12-24 DIAGNOSIS — I499 Cardiac arrhythmia, unspecified: Secondary | ICD-10-CM | POA: Diagnosis not present

## 2018-12-24 DIAGNOSIS — R079 Chest pain, unspecified: Secondary | ICD-10-CM | POA: Diagnosis not present

## 2018-12-24 DIAGNOSIS — Z87891 Personal history of nicotine dependence: Secondary | ICD-10-CM | POA: Insufficient documentation

## 2018-12-24 DIAGNOSIS — Z79899 Other long term (current) drug therapy: Secondary | ICD-10-CM | POA: Insufficient documentation

## 2018-12-24 DIAGNOSIS — Z96651 Presence of right artificial knee joint: Secondary | ICD-10-CM | POA: Insufficient documentation

## 2018-12-24 DIAGNOSIS — Z96641 Presence of right artificial hip joint: Secondary | ICD-10-CM | POA: Diagnosis not present

## 2018-12-24 DIAGNOSIS — I4430 Unspecified atrioventricular block: Secondary | ICD-10-CM | POA: Diagnosis not present

## 2018-12-24 DIAGNOSIS — Z20828 Contact with and (suspected) exposure to other viral communicable diseases: Secondary | ICD-10-CM | POA: Insufficient documentation

## 2018-12-24 DIAGNOSIS — I10 Essential (primary) hypertension: Secondary | ICD-10-CM | POA: Diagnosis not present

## 2018-12-24 LAB — BASIC METABOLIC PANEL
Anion gap: 9 (ref 5–15)
BUN: 22 mg/dL (ref 8–23)
CO2: 24 mmol/L (ref 22–32)
Calcium: 8.6 mg/dL — ABNORMAL LOW (ref 8.9–10.3)
Chloride: 107 mmol/L (ref 98–111)
Creatinine, Ser: 1.07 mg/dL — ABNORMAL HIGH (ref 0.44–1.00)
GFR calc Af Amer: 51 mL/min — ABNORMAL LOW (ref >60)
GFR calc non Af Amer: 44 mL/min — ABNORMAL LOW (ref >60)
Glucose, Bld: 121 mg/dL — ABNORMAL HIGH (ref 70–99)
Potassium: 3.8 mmol/L (ref 3.5–5.1)
Sodium: 140 mmol/L (ref 135–145)

## 2018-12-24 LAB — TROPONIN I
Troponin I: <0.03 ng/mL (ref <0.03)
Troponin I: <0.03 ng/mL (ref <0.03)

## 2018-12-24 LAB — CBC
HCT: 38.7 % (ref 36.0–46.0)
Hemoglobin: 12.5 g/dL (ref 12.0–15.0)
MCH: 32.4 pg (ref 26.0–34.0)
MCHC: 32.3 g/dL (ref 30.0–36.0)
MCV: 100.3 fL — ABNORMAL HIGH (ref 80.0–100.0)
Platelets: 315 10*3/uL (ref 150–400)
RBC: 3.86 MIL/uL — ABNORMAL LOW (ref 3.87–5.11)
RDW: 15.1 % (ref 11.5–15.5)
WBC: 7.8 10*3/uL (ref 4.0–10.5)
nRBC: 0 % (ref 0.0–0.2)

## 2018-12-24 LAB — SARS CORONAVIRUS 2 BY RT PCR (HOSPITAL ORDER, PERFORMED IN ~~LOC~~ HOSPITAL LAB): SARS Coronavirus 2: NEGATIVE

## 2018-12-24 MED ORDER — LEVOFLOXACIN IN D5W 750 MG/150ML IV SOLN
750.0000 mg | Freq: Once | INTRAVENOUS | Status: AC
  Administered 2018-12-24: 750 mg via INTRAVENOUS
  Filled 2018-12-24: qty 150

## 2018-12-24 MED ORDER — ACETAMINOPHEN 500 MG PO TABS
1000.0000 mg | ORAL_TABLET | Freq: Once | ORAL | Status: AC
  Administered 2018-12-24: 1000 mg via ORAL
  Filled 2018-12-24: qty 2

## 2018-12-24 MED ORDER — LEVOFLOXACIN 750 MG PO TABS: 750.0000 mg | ORAL_TABLET | Freq: Every day | ORAL | 0 refills | Status: DC

## 2018-12-24 MED ORDER — LEVOFLOXACIN 500 MG PO TABS: 500.0000 mg | ORAL_TABLET | ORAL | 0 refills | Status: AC

## 2018-12-24 MED ORDER — LEVOFLOXACIN 500 MG PO TABS: 500.0000 mg | ORAL_TABLET | ORAL | 0 refills | Status: DC

## 2018-12-24 NOTE — ED Notes (Signed)
Attempted to reach [facility] staff at this time, no answer. Will attempt again.

## 2018-12-24 NOTE — ED Triage Notes (Addendum)
Pt presents to ED via [facility] from [facility] c/o substernal chest pain, non-radiating, starting today. Pt given 324mg  ASA and x2 sprays of nitro PTA with reduction in pain from 7/10 to 3/10. EMS report noting afib on pre-hospital EKG. Pt has hx SVT but no other cardiac history. Denies SOB. Pt belching frequently during triage assessment.

## 2018-12-24 NOTE — Discharge Instructions (Addendum)
Is return to the ER if you develop any worsening symptoms questions or concerns.

## 2018-12-24 NOTE — ED Notes (Signed)

## 2018-12-24 NOTE — ED Provider Notes (Signed)
[facility] Emergency Department Provider Note    First MD Initiated Contact with Patient 12/24/18 [phone]     (approximate)  I have reviewed the triage vital signs and the nursing notes.   HISTORY  Chief Complaint Chest Pain    HPI Bridget Mendez is a 85 y.o. female presents the ER from [facility] facility for evaluation of chest discomfort.  Symptoms started after she woke up and was getting a shower.  States that she reached up with her right arm to grab her shower And felt a popping sensation in her chest causing some pain.  Denies any shortness of breath.   States the pain is subsided for the most part.  Still feels some discomfort when she moves but is otherwise improved.  Denies any pain or radiation through to her back.  Denies any nausea or vomiting.  No fevers.  Feels like she just pulled a muscle but is very anxious and wants to make sure there is nothing else going on.   Past Medical History:  Diagnosis Date  . Allergy   . Anxiety   . Dental bridge present    permanent, bottom  . Depression   . GERD (gastroesophageal reflux disease)   . Lumbar compression fracture ([place]) 04/2018  . Osteoarthritis   . Osteoporosis   . PVC (premature ventricular contraction)    Family History  Problem Relation Age of Onset  . Parkinsonism Mother   . Cancer Maternal Grandmother    Past Surgical History:  Procedure Laterality Date  . CATARACT EXTRACTION W/PHACO Right 08/15/2018   Procedure: CATARACT EXTRACTION PHACO AND INTRAOCULAR LENS PLACEMENT ([place])  RIGHT;  Surgeon: Leandrew Koyanagi, MD;  Location: [facility];  Service: Ophthalmology;  Laterality: Right;  requests arrival around 10AM  . CATARACT EXTRACTION W/PHACO Left 09/12/2018   Procedure: CATARACT EXTRACTION PHACO AND INTRAOCULAR LENS PLACEMENT ([place])  LEFT;  Surgeon: Leandrew Koyanagi, MD;  Location: [facility];  Service: Ophthalmology;  Laterality: Left;  Requests arrival around  10 AM  . CESAREAN SECTION     x2  . HEMORRHOID SURGERY    . INGUINAL HERNIA REPAIR  02/2009   Dr.Wilton Tamala Julian  . KNEE ARTHROSCOPY  1998 & 2005   both knees  . KYPHOPLASTY N/A 04/24/2018   Procedure: KYPHOPLASTY;  Surgeon: Hessie Knows, MD;  Location: [facility];  Service: Orthopedics;  Laterality: N/A;  . REPLACEMENT TOTAL KNEE Right 2008  . TOTAL HIP ARTHROPLASTY Right 07/29/2014   Procedure: RIGHT TOTAL HIP ARTHROPLASTY ANTERIOR APPROACH;  Surgeon: Hessie Dibble, MD;  Location: [facility];  Service: Orthopedics;  Laterality: Right;   Patient Active Problem List   Diagnosis Date Noted  . SVT (supraventricular tachycardia) ([place]) 07/06/2018  . Compression fracture of L2 ([place]) 04/05/2018  . Episodic mood disorder ([place]) 04/28/2010  . ALLERGIC RHINITIS 09/15/2008  . GERD 10/24/2006  . Primary osteoarthritis involving multiple joints 10/24/2006  . Osteoporosis 10/24/2006      Prior to Admission medications   Medication Sig Start Date End Date Taking? Authorizing Provider  alendronate (FOSAMAX) 70 MG tablet Take 70 mg by mouth once a week. Take with a full glass of water on an empty stomach.   Yes [provider]  ALPRAZolam (XANAX) 0.5 MG tablet TAKE 1/2 TO 1 TABLET BY MOUTH TWICE DAILY AS NEEDED FOR ANXIETY 03/13/18  Yes Venia Carbon, MD  cholecalciferol (VITAMIN D3) 25 MCG (1000 UT) tablet Take 1,000 Units by mouth daily.   Yes  [provider]  glucosamine-chondroitin 500-400 MG tablet Take 1 tablet by mouth daily.   Yes [provider]  metoprolol succinate (TOPROL-XL) 25 MG 24 hr tablet Take 1 tablet (25 mg total) by mouth daily. Take with or immediately following a meal. 12/19/17  Yes Gollan, Kathlene November, MD  Multiple Vitamin (MULTIVITAMIN) tablet Take 1 tablet by mouth daily.    Yes [provider]  Probiotic Product (PROBIOTIC DAILY) CAPS Take 1 capsule by mouth daily.    Yes [provider]  amoxicillin (AMOXIL) 500 MG capsule Take 2,000  mg by mouth as needed (prior to dental appointment).    [provider]  calcium carbonate (TUMS EX) 750 MG chewable tablet Chew 1-2 tablets by mouth daily as needed.     [provider]  HYDROcodone-acetaminophen (NORCO) 5-325 MG tablet Take 1 tablet by mouth every 6 (six) hours as needed. Patient not taking: Reported on 12/24/2018 04/24/18   Hessie Knows, MD  levofloxacin (LEVAQUIN) 500 MG tablet Take 1 tablet (500 mg total) by mouth every other day for 8 days. 12/24/18 01/01/19  Merlyn Lot, MD  methylcellulose oral powder Take 1 packet by mouth daily.     [provider]    Allergies Patient has no known allergies.    Social History Social History   Tobacco Use  . Smoking status: Former Smoker    Packs/day: 1.00    Types: Cigarettes    Quit date: 07/11/1988    Years since quitting: 30.4  . Smokeless tobacco: Never Used  Substance Use Topics  . Alcohol use: Not Currently  . Drug use: No    Review of Systems Patient denies headaches, rhinorrhea, blurry vision, numbness, shortness of breath, chest pain, edema, cough, abdominal pain, nausea, vomiting, diarrhea, dysuria, fevers, rashes or hallucinations unless otherwise stated above in HPI. ____________________________________________   PHYSICAL EXAM:  VITAL SIGNS: Vitals:   12/24/18 1100 12/24/18 1130  BP: 119/66 117/71  Pulse: 80 80  Resp: 15 20  Temp:    SpO2: 95% 94%    Constitutional: Alert and oriented.  Eyes: Conjunctivae are normal.  Head: Atraumatic. Nose: No congestion/rhinnorhea. Mouth/Throat: Mucous membranes are moist.   Neck: No stridor. Painless ROM.  Cardiovascular: Normal rate, regular rhythm. Grossly normal heart sounds.  Good peripheral circulation. Respiratory: Normal respiratory effort.  No retractions. Lungs CTAB. Gastrointestinal: Soft and nontender. No distention. No abdominal bruits. No CVA tenderness. Genitourinary:  Musculoskeletal: No lower extremity  tenderness nor edema.  No joint effusions. Neurologic:  Normal speech and language. No gross focal neurologic deficits are appreciated. No facial droop Skin:  Skin is warm, dry and intact. No rash noted. Psychiatric: Mood and affect are normal. Speech and behavior are normal.  ____________________________________________   LABS (all labs ordered are listed, but only abnormal results are displayed)  Results for orders placed or performed during the hospital encounter of 12/24/18 (from the past 24 hour(s))  Basic metabolic panel     Status: Abnormal   Collection Time: 12/24/18  8:10 AM  Result Value Ref Range   Sodium 140 135 - 145 mmol/L   Potassium 3.8 3.5 - 5.1 mmol/L   Chloride 107 98 - 111 mmol/L   CO2 24 22 - 32 mmol/L   Glucose, Bld 121 (H) 70 - 99 mg/dL   BUN 22 8 - 23 mg/dL   Creatinine, Ser 1.07 (H) 0.44 - 1.00 mg/dL   Calcium 8.6 (L) 8.9 - 10.3 mg/dL   GFR calc non Af Wyvonnia Lora  44 (L) >60 mL/min   GFR calc Af Amer 51 (L) >60 mL/min   Anion gap 9 5 - 15  CBC     Status: Abnormal   Collection Time: 12/24/18  8:10 AM  Result Value Ref Range   WBC 7.8 4.0 - 10.5 K/uL   RBC 3.86 (L) 3.87 - 5.11 MIL/uL   Hemoglobin 12.5 12.0 - 15.0 g/dL   HCT 38.7 36.0 - 46.0 %   MCV 100.3 (H) 80.0 - 100.0 fL   MCH 32.4 26.0 - 34.0 pg   MCHC 32.3 30.0 - 36.0 g/dL   RDW 15.1 11.5 - 15.5 %   Platelets 315 150 - 400 K/uL   nRBC 0.0 0.0 - 0.2 %  Troponin I - ONCE - STAT     Status: None   Collection Time: 12/24/18  8:10 AM  Result Value Ref Range   Troponin I <0.03 <0.03 ng/mL  SARS Coronavirus 2 (CEPHEID- Performed in [facility] hospital lab), Hosp Order     Status: None   Collection Time: 12/24/18  9:30 AM   Specimen: Nasopharyngeal Swab  Result Value Ref Range   SARS Coronavirus 2 NEGATIVE NEGATIVE  Troponin I - Once-Timed     Status: None   Collection Time: 12/24/18 11:13 AM  Result Value Ref Range   Troponin I <0.03 <0.03 ng/mL   ____________________________________________  EKG  My review and personal interpretation at Time: 8:08   Indication: chest pain  Rate: 75  Rhythm: sinus with occasional pvc Axis: left Other: normal intervals, no stemi ____________________________________________  RADIOLOGY  I personally reviewed all radiographic images ordered to evaluate for the above acute complaints and reviewed radiology reports and findings.  These findings were personally discussed with the patient.  Please see medical record for radiology report.  ____________________________________________   PROCEDURES  Procedure(s) performed:  Procedures    Critical Care performed: no ____________________________________________   INITIAL IMPRESSION / ASSESSMENT AND PLAN / ED COURSE  Pertinent labs & imaging results that were available during my care of the patient were reviewed by me and considered in my medical decision making (see chart for details).   DDX: ACS, pericarditis, msk strain, pe, dissection, pna, bronchitis, costochondritis   Bridget Mendez is a 85 y.o. who presents to the ED with discomfort that seems primarily musculoskeletal as described above.  EKG shows no evidence of acute ischemia but given her age and risk factors will order serial enzymes.  Order chest x-ray.  Not seem clinically consistent with PE or dissection.  The patient will be placed on continuous pulse oximetry and telemetry for monitoring.  Laboratory evaluation will be sent to evaluate for the above complaints.     Clinical Course as of Dec 24 1154  Mon Dec 24, 2018  0939 Chest x-ray reported as having pneumonia.  Patient without any fever or white count at this time still suspect acute presentation was secondary to musculoskeletal strain.  Will observe.  Serial enzymes.  Will test for COVID.   [PR]  5883 Patient's repeat troponin is negative.  She is not hypoxic.  At this point I do believe she is appropriate for outpatient management.   [PR]    Clinical Course User Index [PR]  Merlyn Lot, MD    The patient was evaluated in Emergency Department today for the symptoms described in the history of present illness. He/she was evaluated in the context of the global COVID-19 pandemic, which necessitated consideration that the patient might be at risk  for infection with the SARS-CoV-2 virus that causes COVID-19. Institutional protocols and algorithms that pertain to the evaluation of patients at risk for COVID-19 are in a state of rapid change based on information released by regulatory bodies including the CDC and federal and state organizations. These policies and algorithms were followed during the patient's care in the ED.  As part of my medical decision making, I reviewed the following data within the [place] notes reviewed and incorporated, Labs reviewed, notes from prior ED visits and [place] Controlled Substance Database   ____________________________________________   FINAL CLINICAL IMPRESSION(S) / ED DIAGNOSES  Final diagnoses:  Chest pain, unspecified type      NEW MEDICATIONS STARTED DURING THIS VISIT:  Current Discharge Medication List    START taking these medications   Details  levofloxacin (LEVAQUIN) 500 MG tablet Take 1 tablet (500 mg total) by mouth every other day for 8 days. Qty: 4 tablet, Refills: 0         Note:  This document was prepared using Dragon voice recognition software and may include unintentional dictation errors.    Merlyn Lot, MD 12/24/18 [phone]

## 2018-12-27 ENCOUNTER — Other Ambulatory Visit: Payer: Self-pay

## 2018-12-27 ENCOUNTER — Encounter: Payer: Self-pay | Admitting: Internal Medicine

## 2018-12-27 ENCOUNTER — Ambulatory Visit: Payer: Medicare Other | Admitting: Internal Medicine

## 2018-12-27 DIAGNOSIS — J181 Lobar pneumonia, unspecified organism: Secondary | ICD-10-CM

## 2018-12-27 DIAGNOSIS — M150 Primary generalized (osteo)arthritis: Secondary | ICD-10-CM | POA: Diagnosis not present

## 2018-12-27 DIAGNOSIS — I471 Supraventricular tachycardia: Secondary | ICD-10-CM

## 2018-12-27 DIAGNOSIS — F39 Unspecified mood [affective] disorder: Secondary | ICD-10-CM | POA: Diagnosis not present

## 2018-12-27 DIAGNOSIS — M159 Polyosteoarthritis, unspecified: Secondary | ICD-10-CM

## 2018-12-27 DIAGNOSIS — F1120 Opioid dependence, uncomplicated: Secondary | ICD-10-CM

## 2018-12-27 NOTE — Assessment & Plan Note (Signed)
Back is better  Milder chronic pain---doing okay Able to walk and do her personal care

## 2018-12-27 NOTE — Assessment & Plan Note (Signed)
No symptomatic recurrence on the beta blocker

## 2018-12-27 NOTE — Assessment & Plan Note (Signed)
Chronic anxiety Some degree of dysthymia --but mild and not persistent Continue prn xanax

## 2018-12-27 NOTE — Progress Notes (Signed)
Subjective:    Patient ID: Bridget Mendez, female    DOB: 04-25-1920, 85 y.o.   MRN: [id number]  HPI Visit in her assisted living room for ER follow up and review of chronic health conditions Reviewed ER records Discussed with Luellen Pucker RN  Got up to take shower and noted some chest pain when she reached up for her shower cap Right under breast bone Called for help--so went to ER Work up negative except for suggestion of LLL pneumonia on CXR Feels weak now No cough or fever Some SOB---it "catches" with deep breath SARS was negative  Was really stressed by the whole episode Stuck on gurney ("tension") for 5 hours Ongoing anxiety at times---doesn't need the xanax very  "I don't take stress well"  Otherwise okay---somewhat tired though Back pain is better Still uses the hydrocodone--but usually only 1 at bedtime   No recurrence of palpitations No edema No dizziness or syncope  Current Outpatient Medications on File Prior to Visit  Medication Sig Dispense Refill  . alendronate (FOSAMAX) 70 MG tablet Take 70 mg by mouth once a week. Take with a full glass of water on an empty stomach.    . ALPRAZolam (XANAX) 0.5 MG tablet TAKE 1/2 TO 1 TABLET BY MOUTH TWICE DAILY AS NEEDED FOR ANXIETY 60 tablet 0  . amoxicillin (AMOXIL) 500 MG capsule Take 2,000 mg by mouth as needed (prior to dental appointment).    . calcium carbonate (TUMS EX) 750 MG chewable tablet Chew 1-2 tablets by mouth daily as needed.     . cholecalciferol (VITAMIN D3) 25 MCG (1000 UT) tablet Take 1,000 Units by mouth daily.    Marland Kitchen glucosamine-chondroitin 500-400 MG tablet Take 1 tablet by mouth daily.    Marland Kitchen HYDROcodone-acetaminophen (NORCO) 5-325 MG tablet Take 1 tablet by mouth every 6 (six) hours as needed. 15 tablet 0  . levofloxacin (LEVAQUIN) 500 MG tablet Take 1 tablet (500 mg total) by mouth every other day for 8 days. 4 tablet 0  . methylcellulose oral powder Take 1 packet by mouth daily.     . metoprolol  succinate (TOPROL-XL) 25 MG 24 hr tablet Take 1 tablet (25 mg total) by mouth daily. Take with or immediately following a meal. 90 tablet 1  . Multiple Vitamin (MULTIVITAMIN) tablet Take 1 tablet by mouth daily.     . Probiotic Product (PROBIOTIC DAILY) CAPS Take 1 capsule by mouth daily.      No current facility-administered medications on file prior to visit.     No Known Allergies  Past Medical History:  Diagnosis Date  . Allergy   . Anxiety   . Dental bridge present    permanent, bottom  . Depression   . GERD (gastroesophageal reflux disease)   . Lumbar compression fracture ([place]) 04/2018  . Osteoarthritis   . Osteoporosis   . PVC (premature ventricular contraction)     Past Surgical History:  Procedure Laterality Date  . CATARACT EXTRACTION W/PHACO Right 08/15/2018   Procedure: CATARACT EXTRACTION PHACO AND INTRAOCULAR LENS PLACEMENT ([place])  RIGHT;  Surgeon: Leandrew Koyanagi, MD;  Location: [facility];  Service: Ophthalmology;  Laterality: Right;  requests arrival around 10AM  . CATARACT EXTRACTION W/PHACO Left 09/12/2018   Procedure: CATARACT EXTRACTION PHACO AND INTRAOCULAR LENS PLACEMENT ([place])  LEFT;  Surgeon: Leandrew Koyanagi, MD;  Location: [facility];  Service: Ophthalmology;  Laterality: Left;  Requests arrival around 10 AM  . CESAREAN SECTION     x2  .  HEMORRHOID SURGERY    . INGUINAL HERNIA REPAIR  02/2009   Dr.Wilton Tamala Julian  . KNEE ARTHROSCOPY  1998 & 2005   both knees  . KYPHOPLASTY N/A 04/24/2018   Procedure: KYPHOPLASTY;  Surgeon: Hessie Knows, MD;  Location: [facility];  Service: Orthopedics;  Laterality: N/A;  . REPLACEMENT TOTAL KNEE Right 2008  . TOTAL HIP ARTHROPLASTY Right 07/29/2014   Procedure: RIGHT TOTAL HIP ARTHROPLASTY ANTERIOR APPROACH;  Surgeon: Hessie Dibble, MD;  Location: [facility];  Service: Orthopedics;  Laterality: Right;    Family History  Problem Relation Age of Onset  . Parkinsonism Mother   . Cancer Maternal  Grandmother     Social History   Socioeconomic History  . Marital status: Widowed    Spouse name: Not on file  . Number of children: 2  . Years of education: Not on file  . Highest education level: Not on file  Occupational History  . Occupation: retired  Scientific laboratory technician  . Financial resource strain: Not on file  . Food insecurity    Worry: Not on file    Inability: Not on file  . Transportation needs    Medical: Not on file    Non-medical: Not on file  Tobacco Use  . Smoking status: Former Smoker    Packs/day: 1.00    Types: Cigarettes    Quit date: 07/11/1988    Years since quitting: 30.4  . Smokeless tobacco: Never Used  Substance and Sexual Activity  . Alcohol use: Not Currently  . Drug use: No  . Sexual activity: Not on file  Lifestyle  . Physical activity    Days per week: Not on file    Minutes per session: Not on file  . Stress: Not on file  Relationships  . Social Herbalist on phone: Not on file    Gets together: Not on file    Attends religious service: Not on file    Active member of club or organization: Not on file    Attends meetings of clubs or organizations: Not on file    Relationship status: Not on file  . Intimate partner violence    Fear of current or ex partner: Not on file    Emotionally abused: Not on file    Physically abused: Not on file    Forced sexual activity: Not on file  Other Topics Concern  . Not on file  Social History Narrative   Has living will   Daughter, [name], is health care POA--- son [name] is alternate   Has DNR already--reviewed   No tube feeds if cognitively aware      Review of Systems Appetite is good in general Sleeping okay Weight stable No trouble with bowels No heartburn or dysphagia    Objective:   Physical Exam  Constitutional: She appears well-developed. No distress.  Neck: No thyromegaly present.  Cardiovascular: Normal rate, regular rhythm and normal heart sounds. Exam reveals no gallop.   No murmur heard. Respiratory: No respiratory distress. She has no wheezes. She has no rales.  Slight change in breathing when talking  GI: Soft. There is no abdominal tenderness.  Musculoskeletal:        General: No tenderness or edema.  Lymphadenopathy:    She has no cervical adenopathy.  Psychiatric: She has a normal mood and affect. Her behavior is normal.           Assessment & Plan:

## 2018-12-27 NOTE — Assessment & Plan Note (Signed)
Unusual presentation Could be the pain was pleurisy---but may have been incidental finding (if the minor CXR abnormality persists) Will finish out the levaquin Looks okay now (but does have some mild breathing change and fatigue)

## 2018-12-27 NOTE — Assessment & Plan Note (Signed)
Has decreased need as compression fracture has healed Monitored by AL staff

## 2018-12-29 LAB — CULTURE, BLOOD (ROUTINE X 2)
Culture: NO GROWTH
Culture: NO GROWTH

## 2018-12-31 DIAGNOSIS — R062 Wheezing: Secondary | ICD-10-CM | POA: Diagnosis not present

## 2018-12-31 DIAGNOSIS — J918 Pleural effusion in other conditions classified elsewhere: Secondary | ICD-10-CM | POA: Diagnosis not present

## 2018-12-31 DIAGNOSIS — R0602 Shortness of breath: Secondary | ICD-10-CM | POA: Diagnosis not present

## 2019-01-02 ENCOUNTER — Other Ambulatory Visit: Payer: Self-pay

## 2019-01-02 ENCOUNTER — Ambulatory Visit: Payer: Medicare Other | Admitting: Internal Medicine

## 2019-01-02 VITALS — BP 108/73 | HR 112 | Temp 96.6°F | Resp 17 | Wt 162.2 lb

## 2019-01-02 DIAGNOSIS — J189 Pneumonia, unspecified organism: Secondary | ICD-10-CM | POA: Diagnosis not present

## 2019-01-04 ENCOUNTER — Inpatient Hospital Stay: Payer: Medicare Other

## 2019-01-04 ENCOUNTER — Emergency Department (HOSPITAL_COMMUNITY)
Admit: 2019-01-04 | Discharge: 2019-01-04 | Disposition: A | Discharge status: Home / Self Care | Payer: Medicare Other | Attending: Internal Medicine | Admitting: Internal Medicine

## 2019-01-04 ENCOUNTER — Inpatient Hospital Stay
Admission: EM | Admit: 2019-01-04 | Discharge: 2019-01-10 | DRG: 314 | Disposition: A | Discharge status: Skilled Nursing Facility | Payer: Medicare Other | Attending: Internal Medicine | Admitting: Internal Medicine

## 2019-01-04 ENCOUNTER — Other Ambulatory Visit: Payer: Self-pay

## 2019-01-04 ENCOUNTER — Ambulatory Visit: Payer: Medicare Other | Admitting: Internal Medicine

## 2019-01-04 ENCOUNTER — Encounter: Payer: Self-pay | Admitting: Emergency Medicine

## 2019-01-04 ENCOUNTER — Encounter
Admission: EM | Disposition: A | Discharge status: Skilled Nursing Facility | Payer: Self-pay | Source: Home / Self Care | Attending: Internal Medicine

## 2019-01-04 ENCOUNTER — Emergency Department: Payer: Medicare Other

## 2019-01-04 VITALS — BP 100/58 | HR 101 | Temp 97.9°F | Resp 22

## 2019-01-04 DIAGNOSIS — J9601 Acute respiratory failure with hypoxia: Secondary | ICD-10-CM | POA: Diagnosis present

## 2019-01-04 DIAGNOSIS — Z79899 Other long term (current) drug therapy: Secondary | ICD-10-CM

## 2019-01-04 DIAGNOSIS — M810 Age-related osteoporosis without current pathological fracture: Secondary | ICD-10-CM | POA: Diagnosis present

## 2019-01-04 DIAGNOSIS — I312 Hemopericardium, not elsewhere classified: Secondary | ICD-10-CM | POA: Diagnosis present

## 2019-01-04 DIAGNOSIS — R0602 Shortness of breath: Secondary | ICD-10-CM

## 2019-01-04 DIAGNOSIS — Z96651 Presence of right artificial knee joint: Secondary | ICD-10-CM | POA: Diagnosis present

## 2019-01-04 DIAGNOSIS — Z66 Do not resuscitate: Secondary | ICD-10-CM | POA: Diagnosis present

## 2019-01-04 DIAGNOSIS — I314 Cardiac tamponade: Secondary | ICD-10-CM | POA: Diagnosis present

## 2019-01-04 DIAGNOSIS — J302 Other seasonal allergic rhinitis: Secondary | ICD-10-CM | POA: Diagnosis not present

## 2019-01-04 DIAGNOSIS — Z48813 Encounter for surgical aftercare following surgery on the respiratory system: Secondary | ICD-10-CM | POA: Diagnosis not present

## 2019-01-04 DIAGNOSIS — I129 Hypertensive chronic kidney disease with stage 1 through stage 4 chronic kidney disease, or unspecified chronic kidney disease: Secondary | ICD-10-CM | POA: Diagnosis present

## 2019-01-04 DIAGNOSIS — R079 Chest pain, unspecified: Secondary | ICD-10-CM | POA: Diagnosis not present

## 2019-01-04 DIAGNOSIS — F329 Major depressive disorder, single episode, unspecified: Secondary | ICD-10-CM | POA: Diagnosis present

## 2019-01-04 DIAGNOSIS — N183 Chronic kidney disease, stage 3 (moderate): Secondary | ICD-10-CM | POA: Diagnosis present

## 2019-01-04 DIAGNOSIS — J189 Pneumonia, unspecified organism: Secondary | ICD-10-CM | POA: Diagnosis not present

## 2019-01-04 DIAGNOSIS — Z1159 Encounter for screening for other viral diseases: Secondary | ICD-10-CM | POA: Diagnosis not present

## 2019-01-04 DIAGNOSIS — I5031 Acute diastolic (congestive) heart failure: Secondary | ICD-10-CM

## 2019-01-04 DIAGNOSIS — N179 Acute kidney failure, unspecified: Secondary | ICD-10-CM | POA: Diagnosis present

## 2019-01-04 DIAGNOSIS — E875 Hyperkalemia: Secondary | ICD-10-CM | POA: Diagnosis not present

## 2019-01-04 DIAGNOSIS — R0902 Hypoxemia: Secondary | ICD-10-CM | POA: Diagnosis not present

## 2019-01-04 DIAGNOSIS — C801 Malignant (primary) neoplasm, unspecified: Secondary | ICD-10-CM | POA: Diagnosis not present

## 2019-01-04 DIAGNOSIS — J90 Pleural effusion, not elsewhere classified: Secondary | ICD-10-CM | POA: Diagnosis present

## 2019-01-04 DIAGNOSIS — D649 Anemia, unspecified: Secondary | ICD-10-CM | POA: Diagnosis present

## 2019-01-04 DIAGNOSIS — E871 Hypo-osmolality and hyponatremia: Secondary | ICD-10-CM | POA: Diagnosis present

## 2019-01-04 DIAGNOSIS — Z20828 Contact with and (suspected) exposure to other viral communicable diseases: Secondary | ICD-10-CM | POA: Diagnosis not present

## 2019-01-04 DIAGNOSIS — R0600 Dyspnea, unspecified: Secondary | ICD-10-CM | POA: Diagnosis not present

## 2019-01-04 DIAGNOSIS — J9600 Acute respiratory failure, unspecified whether with hypoxia or hypercapnia: Secondary | ICD-10-CM | POA: Diagnosis not present

## 2019-01-04 DIAGNOSIS — I10 Essential (primary) hypertension: Secondary | ICD-10-CM | POA: Diagnosis not present

## 2019-01-04 DIAGNOSIS — Z9889 Other specified postprocedural states: Secondary | ICD-10-CM

## 2019-01-04 DIAGNOSIS — K219 Gastro-esophageal reflux disease without esophagitis: Secondary | ICD-10-CM | POA: Diagnosis present

## 2019-01-04 DIAGNOSIS — Z471 Aftercare following joint replacement surgery: Secondary | ICD-10-CM | POA: Diagnosis not present

## 2019-01-04 DIAGNOSIS — I361 Nonrheumatic tricuspid (valve) insufficiency: Secondary | ICD-10-CM | POA: Diagnosis not present

## 2019-01-04 DIAGNOSIS — D62 Acute posthemorrhagic anemia: Secondary | ICD-10-CM | POA: Diagnosis present

## 2019-01-04 DIAGNOSIS — F39 Unspecified mood [affective] disorder: Secondary | ICD-10-CM

## 2019-01-04 DIAGNOSIS — I3139 Other pericardial effusion (noninflammatory): Secondary | ICD-10-CM

## 2019-01-04 DIAGNOSIS — I313 Pericardial effusion (noninflammatory): Principal | ICD-10-CM | POA: Diagnosis present

## 2019-01-04 DIAGNOSIS — Z7401 Bed confinement status: Secondary | ICD-10-CM | POA: Diagnosis not present

## 2019-01-04 DIAGNOSIS — R069 Unspecified abnormalities of breathing: Secondary | ICD-10-CM | POA: Diagnosis not present

## 2019-01-04 DIAGNOSIS — M2550 Pain in unspecified joint: Secondary | ICD-10-CM | POA: Diagnosis not present

## 2019-01-04 DIAGNOSIS — I493 Ventricular premature depolarization: Secondary | ICD-10-CM | POA: Diagnosis not present

## 2019-01-04 DIAGNOSIS — F419 Anxiety disorder, unspecified: Secondary | ICD-10-CM | POA: Diagnosis present

## 2019-01-04 DIAGNOSIS — Z96641 Presence of right artificial hip joint: Secondary | ICD-10-CM | POA: Diagnosis present

## 2019-01-04 DIAGNOSIS — M542 Cervicalgia: Secondary | ICD-10-CM | POA: Diagnosis not present

## 2019-01-04 DIAGNOSIS — J9690 Respiratory failure, unspecified, unspecified whether with hypoxia or hypercapnia: Secondary | ICD-10-CM | POA: Diagnosis present

## 2019-01-04 DIAGNOSIS — Z87891 Personal history of nicotine dependence: Secondary | ICD-10-CM | POA: Diagnosis not present

## 2019-01-04 DIAGNOSIS — S22060A Wedge compression fracture of T7-T8 vertebra, initial encounter for closed fracture: Secondary | ICD-10-CM | POA: Diagnosis not present

## 2019-01-04 HISTORY — PX: PERICARDIOCENTESIS: CATH118255

## 2019-01-04 LAB — COMPREHENSIVE METABOLIC PANEL
ALT: 30 U/L (ref 0–44)
AST: 26 U/L (ref 15–41)
Albumin: 3 g/dL — ABNORMAL LOW (ref 3.5–5.0)
Alkaline Phosphatase: 161 U/L — ABNORMAL HIGH (ref 38–126)
Anion gap: 12 (ref 5–15)
BUN: 22 mg/dL (ref 8–23)
CO2: 22 mmol/L (ref 22–32)
Calcium: 8.1 mg/dL — ABNORMAL LOW (ref 8.9–10.3)
Chloride: 95 mmol/L — ABNORMAL LOW (ref 98–111)
Creatinine, Ser: 1.15 mg/dL — ABNORMAL HIGH (ref 0.44–1.00)
GFR calc Af Amer: 47 mL/min — ABNORMAL LOW (ref >60)
GFR calc non Af Amer: 40 mL/min — ABNORMAL LOW (ref >60)
Glucose, Bld: 121 mg/dL — ABNORMAL HIGH (ref 70–99)
Potassium: 5.1 mmol/L (ref 3.5–5.1)
Sodium: 129 mmol/L — ABNORMAL LOW (ref 135–145)
Total Bilirubin: 0.6 mg/dL (ref 0.3–1.2)
Total Protein: 6.7 g/dL (ref 6.5–8.1)

## 2019-01-04 LAB — CBC
HCT: 29.8 % — ABNORMAL LOW (ref 36.0–46.0)
Hemoglobin: 9.7 g/dL — ABNORMAL LOW (ref 12.0–15.0)
MCH: 31.6 pg (ref 26.0–34.0)
MCHC: 32.6 g/dL (ref 30.0–36.0)
MCV: 97.1 fL (ref 80.0–100.0)
Platelets: 509 10*3/uL — ABNORMAL HIGH (ref 150–400)
RBC: 3.07 MIL/uL — ABNORMAL LOW (ref 3.87–5.11)
RDW: 15 % (ref 11.5–15.5)
WBC: 8.2 10*3/uL (ref 4.0–10.5)
nRBC: 0 % (ref 0.0–0.2)

## 2019-01-04 LAB — ECHOCARDIOGRAM COMPLETE
Height: 63.5 in
Weight: 2560 oz

## 2019-01-04 LAB — TROPONIN I (HIGH SENSITIVITY)
Troponin I (High Sensitivity): 8 ng/L (ref <18)
Troponin I (High Sensitivity): 7 ng/L (ref <18)
Troponin I (High Sensitivity): 9 ng/L (ref <18)
Troponin I (High Sensitivity): 10 ng/L (ref <18)

## 2019-01-04 LAB — MRSA PCR SCREENING: MRSA by PCR: NEGATIVE

## 2019-01-04 LAB — BRAIN NATRIURETIC PEPTIDE: B Natriuretic Peptide: 353 pg/mL — ABNORMAL HIGH (ref 0.0–100.0)

## 2019-01-04 LAB — BODY FLUID CELL COUNT WITH DIFFERENTIAL
Eos, Fluid: 2 %
Lymphs, Fluid: 30 %
Monocyte-Macrophage-Serous Fluid: 10 %
Neutrophil Count, Fluid: 58 %
Total Nucleated Cell Count, Fluid: 1926 cu mm

## 2019-01-04 LAB — SARS CORONAVIRUS 2 BY RT PCR (HOSPITAL ORDER, PERFORMED IN ~~LOC~~ HOSPITAL LAB): SARS Coronavirus 2: NEGATIVE

## 2019-01-04 LAB — GLUCOSE, CAPILLARY: Glucose-Capillary: 105 mg/dL — ABNORMAL HIGH (ref 70–99)

## 2019-01-04 SURGERY — PERICARDIOCENTESIS
Anesthesia: Moderate Sedation

## 2019-01-04 MED ORDER — HEPARIN (PORCINE) IN NACL 1000-0.9 UT/500ML-% IV SOLN
INTRAVENOUS | Status: AC
  Filled 2019-01-04: qty 1000

## 2019-01-04 MED ORDER — RISAQUAD PO CAPS
1.0000 | ORAL_CAPSULE | Freq: Every day | ORAL | Status: DC
  Administered 2019-01-05 – 2019-01-10 (×6): 1 via ORAL
  Filled 2019-01-04 (×7): qty 1

## 2019-01-04 MED ORDER — ALPRAZOLAM 0.25 MG PO TABS
0.2500 mg | ORAL_TABLET | Freq: Two times a day (BID) | ORAL | Status: DC | PRN
  Administered 2019-01-06: 0.5 mg via ORAL
  Administered 2019-01-06: 0.25 mg via ORAL
  Administered 2019-01-07: 0.5 mg via ORAL
  Administered 2019-01-07: 08:00:00 0.25 mg via ORAL
  Administered 2019-01-08 – 2019-01-09 (×4): 0.5 mg via ORAL
  Filled 2019-01-04: qty 2
  Filled 2019-01-04: qty 1
  Filled 2019-01-04 (×3): qty 2
  Filled 2019-01-04: qty 1
  Filled 2019-01-04: qty 2
  Filled 2019-01-04: qty 1

## 2019-01-04 MED ORDER — FUROSEMIDE 10 MG/ML IJ SOLN
20.0000 mg | Freq: Once | INTRAMUSCULAR | Status: AC
  Administered 2019-01-04: 16:00:00 20 mg via INTRAVENOUS
  Filled 2019-01-04: qty 4

## 2019-01-04 MED ORDER — IOHEXOL 350 MG/ML SOLN
60.0000 mL | Freq: Once | INTRAVENOUS | Status: AC | PRN
  Administered 2019-01-04: 60 mL via INTRAVENOUS

## 2019-01-04 MED ORDER — PSYLLIUM 95 % PO PACK
1.0000 | PACK | Freq: Every day | ORAL | Status: DC
  Filled 2019-01-04 (×4): qty 1

## 2019-01-04 MED ORDER — POLYETHYLENE GLYCOL 3350 17 G PO PACK: 17.0000 g | PACK | Freq: Every day | ORAL | Status: DC | PRN

## 2019-01-04 MED ORDER — VITAMIN D 25 MCG (1000 UNIT) PO TABS
1000.0000 [IU] | ORAL_TABLET | Freq: Every day | ORAL | Status: DC
  Administered 2019-01-06 – 2019-01-10 (×5): 1000 [IU] via ORAL
  Filled 2019-01-04 (×6): qty 1

## 2019-01-04 MED ORDER — MIDAZOLAM HCL 2 MG/2ML IJ SOLN
INTRAMUSCULAR | Status: AC
  Filled 2019-01-04: qty 2

## 2019-01-04 MED ORDER — CALCIUM CARBONATE ANTACID 500 MG PO CHEW: 3.0000 | CHEWABLE_TABLET | Freq: Every day | ORAL | Status: DC | PRN

## 2019-01-04 MED ORDER — SODIUM CHLORIDE 0.9 % IV SOLN
INTRAVENOUS | Status: DC
  Administered 2019-01-04 – 2019-01-05 (×2): via INTRAVENOUS

## 2019-01-04 MED ORDER — FENTANYL CITRATE (PF) 100 MCG/2ML IJ SOLN
INTRAMUSCULAR | Status: AC
  Filled 2019-01-04: qty 2

## 2019-01-04 MED ORDER — METOPROLOL SUCCINATE ER 25 MG PO TB24
25.0000 mg | ORAL_TABLET | Freq: Every day | ORAL | Status: DC
  Administered 2019-01-05: 25 mg via ORAL
  Administered 2019-01-06: 09:00:00 via ORAL
  Administered 2019-01-07: 25 mg via ORAL
  Filled 2019-01-04 (×3): qty 1

## 2019-01-04 MED ORDER — ACETAMINOPHEN 650 MG RE SUPP: 650.0000 mg | Freq: Four times a day (QID) | RECTAL | Status: DC | PRN

## 2019-01-04 MED ORDER — FENTANYL CITRATE (PF) 100 MCG/2ML IJ SOLN
INTRAMUSCULAR | Status: DC | PRN
  Administered 2019-01-04: 12.5 ug via INTRAVENOUS

## 2019-01-04 MED ORDER — DOCUSATE SODIUM 100 MG PO CAPS
100.0000 mg | ORAL_CAPSULE | Freq: Two times a day (BID) | ORAL | Status: DC
  Administered 2019-01-04 – 2019-01-05 (×2): 100 mg via ORAL
  Filled 2019-01-04 (×2): qty 1

## 2019-01-04 MED ORDER — ACETAMINOPHEN 325 MG PO TABS: 650.0000 mg | ORAL_TABLET | Freq: Four times a day (QID) | ORAL | Status: DC | PRN

## 2019-01-04 MED ORDER — ALBUTEROL SULFATE (2.5 MG/3ML) 0.083% IN NEBU: 2.5000 mg | INHALATION_SOLUTION | Freq: Four times a day (QID) | RESPIRATORY_TRACT | Status: DC | PRN

## 2019-01-04 MED ORDER — HYDROCODONE-ACETAMINOPHEN 5-325 MG PO TABS: 1.0000 | ORAL_TABLET | Freq: Four times a day (QID) | ORAL | Status: DC | PRN

## 2019-01-04 MED ORDER — NITROGLYCERIN 0.4 MG SL SUBL: 0.4000 mg | SUBLINGUAL_TABLET | SUBLINGUAL | Status: DC | PRN

## 2019-01-04 MED ORDER — GLUCOSAMINE-CHONDROITIN 500-400 MG PO TABS: 1.0000 | ORAL_TABLET | Freq: Every day | ORAL | Status: DC

## 2019-01-04 MED ORDER — SODIUM CHLORIDE 0.9 % IV SOLN
Freq: Once | INTRAVENOUS | Status: AC
  Administered 2019-01-04: 15:00:00 via INTRAVENOUS

## 2019-01-04 MED ORDER — PANTOPRAZOLE SODIUM 40 MG PO TBEC
40.0000 mg | DELAYED_RELEASE_TABLET | Freq: Every day | ORAL | Status: DC
  Administered 2019-01-04 – 2019-01-10 (×7): 40 mg via ORAL
  Filled 2019-01-04 (×7): qty 1

## 2019-01-04 MED ORDER — ONDANSETRON HCL 4 MG PO TABS: 4.0000 mg | ORAL_TABLET | Freq: Four times a day (QID) | ORAL | Status: DC | PRN

## 2019-01-04 MED ORDER — MIDAZOLAM HCL 2 MG/2ML IJ SOLN
INTRAMUSCULAR | Status: DC | PRN
  Administered 2019-01-04: 0.5 mg via INTRAVENOUS

## 2019-01-04 MED ORDER — ALENDRONATE SODIUM 70 MG PO TABS: 70.0000 mg | ORAL_TABLET | ORAL | Status: DC

## 2019-01-04 MED ORDER — ADULT MULTIVITAMIN W/MINERALS CH
1.0000 | ORAL_TABLET | Freq: Every day | ORAL | Status: DC
  Administered 2019-01-05 – 2019-01-10 (×6): 1 via ORAL
  Filled 2019-01-04 (×6): qty 1

## 2019-01-04 MED ORDER — ALBUTEROL SULFATE (2.5 MG/3ML) 0.083% IN NEBU
2.5000 mg | INHALATION_SOLUTION | Freq: Four times a day (QID) | RESPIRATORY_TRACT | Status: DC
  Administered 2019-01-04: 2.5 mg via RESPIRATORY_TRACT
  Filled 2019-01-04: qty 3

## 2019-01-04 MED ORDER — ONDANSETRON HCL 4 MG/2ML IJ SOLN: 4.0000 mg | Freq: Four times a day (QID) | INTRAMUSCULAR | Status: DC | PRN

## 2019-01-04 SURGICAL SUPPLY — 3 items
CANNULA 5F STIFF (CANNULA) ×2 IMPLANT
PACK CARDIAC CATH (CUSTOM PROCEDURE TRAY) ×2 IMPLANT
PERIVAC PERICARDIOCENTESIS 8.3 (TRAY / TRAY PROCEDURE) ×2 IMPLANT

## 2019-01-04 NOTE — Progress Notes (Signed)
PHARMACIST - PHYSICIAN ORDER COMMUNICATION  CONCERNING: P&T Medication Policy on Herbal Medications  DESCRIPTION:  This patient's order for:  Glucosamine-chondroitin  has been noted.  This product(s) is classified as an "herbal" or natural product. Due to a lack of definitive safety studies or FDA approval, nonstandard manufacturing practices, plus the potential risk of unknown drug-drug interactions while on inpatient medications, the Pharmacy and Therapeutics Committee does not permit the use of "herbal" or natural products of this type within [place].   ACTION TAKEN: The pharmacy department is unable to verify this order at this time and your patient has been informed of this safety policy. Please reevaluate patient's clinical condition at discharge and address if the herbal or natural product(s) should be resumed at that time.   

## 2019-01-04 NOTE — ED Notes (Signed)
Hearing aides placed in patient's own gray case and placed in red bag.

## 2019-01-04 NOTE — ED Notes (Signed)
Cardiologist at bedside.  

## 2019-01-04 NOTE — Interval H&P Note (Signed)
History and Physical Interval Note:  01/04/2019 5:08 PM  Bridget Mendez  has presented today for cardiac catheterization, with the diagnosis of pericardial effusion.  The various methods of treatment have been discussed with the patient and family. After consideration of risks, benefits and other options for treatment, the patient has consented to  Procedure(s): PERICARDIOCENTESIS (N/A) as a surgical intervention.  The patient's history has been reviewed, patient examined, no change in status, stable for surgery.  I have reviewed the patient's chart and labs.  Questions were answered to the patient's satisfaction.     Sonni Barse

## 2019-01-04 NOTE — Progress Notes (Signed)
*  PRELIMINARY RESULTS* Echocardiogram 2D Echocardiogram has been performed.  Sherrie Sport 01/04/2019, 3:33 PM

## 2019-01-04 NOTE — Progress Notes (Signed)

## 2019-01-04 NOTE — Consult Note (Signed)
Cardiology Consultation:   Patient ID: Bridget Mendez; [id number]; [birth year]   Admit date: 01/04/2019 Date of Consult: 01/04/2019  Primary Care Provider: Venia Carbon, MD Primary Cardiologist: Rockey Situ   Patient Profile:   Bridget Mendez is a 85 y.o. female with a hx of PVCs, left bundle branch block, anxiety, depression, lumbar compression fracture, osteoporosis, and GERD who is being seen today for the evaluation of pericardial effusion at the request of Dr. Kerman Passey.  History of Present Illness:   [name] was evaluated by Dr. Rockey Situ in 05/2017 for evaluation of arrhythmia.  She recently been in the hospital with associated weakness and fatigue in late 05/2017 with associated near syncope.  She was seen by outside cardiology group and told she had A. fib.  Dr. Donivan Scull review of available data showed no evidence of A. fib.  Echo during that admission showed an EF of 60 to 65% with a calcified mitral annulus.  She was started on low-dose metoprolol for PVCs.  More recently, the patient was seen in the ED on 12/24/2018 with chest pain.  Notes indicate she reached up to grab her shower cap with her right arm and felt a popping sensation in her chest leading to sharp chest pain.  Troponin negative x2 at that time.  EKG not scanned in for review.  EKG showed left lower lobe airspace disease concerning for pneumonia with addendum to report from today's ED visit indicating an age-indeterminate T7 vertebral body compression fracture.  She was treated for pneumonia.  Despite continued antibiotic therapy and addition of supplemental oxygen at 2 L via nasal cannula she has continued to note worsening SOB, now to the point that she cannot participate in ADLs. Of note, the patient reported a lesion on the right breast for the past several months that has not been evaluated. She denies any mammograms over the past several years. She denies any anticoagulation and stopped ASA about 1 year  prior.   Upon the patient's arrival to [facility] they were found to have stable BP with heart rates in the low 100s bpm. EKG showed sinus tachycardia with no left bundle branch block as outlined below, CTA chest showed no evidence of PE with small bilateral pleural effusions left greater than the right with bibasilar atelectasis, moderate pericardial effusion measuring 2.4 cm in thickness along the LV, age-indeterminate T7 vertebral body compression fracture, and aortic atherosclerosis. Labs showed initial high-sensitivity troponin negative at 8, sodium 129, potassium 5.1, serum creatinine 1.15, albumin 3.0, AST/ALT normal, hemoglobin 9.7 with a value of 12.5 eleven days prior.  Stat echo to evaluate pericardial effusion is currently pending with preliminary read showing a large pericardial effusion.  Past Medical History:  Diagnosis Date   Allergy    Anxiety    Dental bridge present    permanent, bottom   Depression    GERD (gastroesophageal reflux disease)    Lumbar compression fracture ([place]) 04/2018   Osteoarthritis    Osteoporosis    PVC (premature ventricular contraction)     Past Surgical History:  Procedure Laterality Date   CATARACT EXTRACTION W/PHACO Right 08/15/2018   Procedure: CATARACT EXTRACTION PHACO AND INTRAOCULAR LENS PLACEMENT ([place])  RIGHT;  Surgeon: Leandrew Koyanagi, MD;  Location: [place];  Service: Ophthalmology;  Laterality: Right;  requests arrival around [place] Left 09/12/2018   Procedure: CATARACT EXTRACTION PHACO AND INTRAOCULAR LENS PLACEMENT ([place])  LEFT;  Surgeon: Leandrew Koyanagi, MD;  Location: [facility];  Service: Ophthalmology;  Laterality: Left;  Requests arrival around 10 AM   CESAREAN SECTION     x2   [place]  02/2009   Dr.Wilton Tamala Julian   KNEE ARTHROSCOPY  1998 & 2005   both knees   KYPHOPLASTY N/A 04/24/2018   Procedure: KYPHOPLASTY;  Surgeon: Hessie Knows, MD;  Location: [facility];  Service: Orthopedics;  Laterality: N/A;   REPLACEMENT TOTAL KNEE Right 2008   TOTAL HIP ARTHROPLASTY Right 07/29/2014   Procedure: RIGHT TOTAL HIP ARTHROPLASTY ANTERIOR APPROACH;  Surgeon: Hessie Dibble, MD;  Location: [place];  Service: Orthopedics;  Laterality: Right;     Home Meds: Prior to Admission medications   Medication Sig Start Date End Date Taking? Authorizing Provider  alendronate (FOSAMAX) 70 MG tablet Take 70 mg by mouth once a week. Take with a full glass of water on an empty stomach.    [provider]  ALPRAZolam (XANAX) 0.5 MG tablet TAKE 1/2 TO 1 TABLET BY MOUTH TWICE DAILY AS NEEDED FOR ANXIETY 03/13/18   Venia Carbon, MD  amoxicillin (AMOXIL) 500 MG capsule Take 2,000 mg by mouth as needed (prior to dental appointment).    [provider]  calcium carbonate (TUMS EX) 750 MG chewable tablet Chew 1-2 tablets by mouth daily as needed.     [provider]  cholecalciferol (VITAMIN D3) 25 MCG (1000 UT) tablet Take 1,000 Units by mouth daily.    [provider]  glucosamine-chondroitin 500-400 MG tablet Take 1 tablet by mouth daily.    [provider]  HYDROcodone-acetaminophen (NORCO) 5-325 MG tablet Take 1 tablet by mouth every 6 (six) hours as needed. 04/24/18   Hessie Knows, MD  methylcellulose oral powder Take 1 packet by mouth daily.     [provider]  metoprolol succinate (TOPROL-XL) 25 MG 24 hr tablet Take 1 tablet (25 mg total) by mouth daily. Take with or immediately following a meal. 12/19/17   Gollan, Kathlene November, MD  Multiple Vitamin (MULTIVITAMIN) tablet Take 1 tablet by mouth daily.     [provider]  Probiotic Product (PROBIOTIC DAILY) CAPS Take 1 capsule by mouth daily.     [provider]    Inpatient Medications: Scheduled Meds:  furosemide  20 mg Intravenous Once   Continuous Infusions:  PRN Meds:   Allergies:  No Known  Allergies  Social History:   Social History   Socioeconomic History   Marital status: Widowed    Spouse name: Not on file   Number of children: 2   Years of education: Not on file   Highest education level: Not on file  Occupational History   Occupation: retired  Scientist, product/process development strain: Not on file   Food insecurity    Worry: Not on file    Inability: Not on file   Transportation needs    Medical: Not on file    Non-medical: Not on file  Tobacco Use   Smoking status: Former Smoker    Packs/day: 1.00    Types: Cigarettes    Quit date: 07/11/1988    Years since quitting: 30.5   Smokeless tobacco: Never Used  Substance and Sexual Activity   Alcohol use: Not Currently   Drug use: No   Sexual activity: Not on file  Lifestyle   Physical activity    Days per week: Not on file    Minutes per session: Not on  file   Stress: Not on file  Relationships   Social connections    Talks on phone: Not on file    Gets together: Not on file    Attends religious service: Not on file    Active member of club or organization: Not on file    Attends meetings of clubs or organizations: Not on file    Relationship status: Not on file   Intimate partner violence    Fear of current or ex partner: Not on file    Emotionally abused: Not on file    Physically abused: Not on file    Forced sexual activity: Not on file  Other Topics Concern   Not on file  Social History Narrative   Has living will   Daughter, [name], is health care POA--- son [name] is alternate   Has DNR already--reviewed   No tube feeds if cognitively aware        Family History:   Family History  Problem Relation Age of Onset   Parkinsonism Mother    Cancer Maternal Grandmother     ROS:  Review of Systems  Constitutional: Positive for malaise/fatigue. Negative for chills, diaphoresis, fever and weight loss.  HENT: Negative for congestion.   Eyes: Negative for discharge and  redness.  Respiratory: Positive for shortness of breath. Negative for cough, hemoptysis, sputum production and wheezing.   Cardiovascular: Positive for chest pain and leg swelling. Negative for palpitations, orthopnea, claudication and PND.  Gastrointestinal: Negative for abdominal pain, blood in stool, heartburn, melena, nausea and vomiting.  Genitourinary: Negative for hematuria.  Musculoskeletal: Negative for falls and myalgias.  Skin: Negative for rash.  Neurological: Positive for weakness. Negative for dizziness, tingling, tremors, sensory change, speech change, focal weakness and loss of consciousness.  Endo/Heme/Allergies: Does not bruise/bleed easily.  Psychiatric/Behavioral: Negative for substance abuse. The patient is not nervous/anxious.   All other systems reviewed and are negative.     Physical Exam/Data:   Vitals:   01/04/19 1130 01/04/19 1230 01/04/19 1400 01/04/19 1430  BP: 116/86 123/84 (!) 121/91 123/84  Pulse: (!) 105 (!) 109 (!) 109 (!) 108  Resp: (!) 23 (!) 30 (!) 27 (!) 24  SpO2: 95% 95% 97% 93%  Weight:      Height:       No intake or output data in the 24 hours ending 01/04/19 1450 Filed Weights   01/04/19 1101  Weight: 72.6 kg   Body mass index is 27.9 kg/m.   Physical Exam: General: Elderly appearing and noticeably SOB speaking in 2-3 word phraes. Head: Normocephalic, atraumatic, sclera non-icteric, no xanthomas, nares without discharge.  Neck: Negative for carotid bruits. JVD not elevated. Lungs: Diminished breath sounds along the bilateral bases. Breathing is mildly labored. Heart: RRR with S1 S2. No murmurs, rubs, or gallops appreciated. Abdomen: Soft, non-tender, non-distended with normoactive bowel sounds. No hepatomegaly. No rebound/guarding. No obvious abdominal masses. Msk:  Strength and tone appear normal for age. Extremities: No clubbing or cyanosis. Trace bilateral pretibial edema. Distal pedal pulses are 2+ and equal bilaterally. Neuro:  Alert and oriented X 3. No facial asymmetry. No focal deficit. Moves all extremities spontaneously. Psych:  Responds to questions appropriately with a normal affect.   EKG:  The EKG was personally reviewed and demonstrates: sinus tachycardia, 110 bpm, 1st degree AV block, LBBB Telemetry:  Telemetry was personally reviewed and demonstrates: sinus tachycardia, low 100s bpm  Weights: Filed Weights   01/04/19 1101  Weight: 72.6 kg  Relevant CV Studies: 2D Echo 05/2017: - Left ventricle: Systolic function was normal. The estimated   ejection fraction was in the range of 60% to 65%. - Aortic valve: Valve area (VTI): 1.58 cm^2. Valve area (Vmax):   1.51 cm^2. Valve area (Vmean): 1.47 cm^2. - Mitral valve: Calcified annulus. __________  2D Echo this admission pending  Laboratory Data:  Chemistry Recent Labs  Lab 01/04/19 1121  NA 129*  K 5.1  CL 95*  CO2 22  GLUCOSE 121*  BUN 22  CREATININE 1.15*  CALCIUM 8.1*  GFRNONAA 40*  GFRAA 47*  ANIONGAP 12    Recent Labs  Lab 01/04/19 1121  PROT 6.7  ALBUMIN 3.0*  AST 26  ALT 30  ALKPHOS 161*  BILITOT 0.6   Hematology Recent Labs  Lab 01/04/19 1121  WBC 8.2  RBC 3.07*  HGB 9.7*  HCT 29.8*  MCV 97.1  MCH 31.6  MCHC 32.6  RDW 15.0  PLT 509*   Cardiac EnzymesNo results for input(s): TROPONINI in the last 168 hours. No results for input(s): TROPIPOC in the last 168 hours.  BNPNo results for input(s): BNP, PROBNP in the last 168 hours.  DDimer No results for input(s): DDIMER in the last 168 hours.  Radiology/Studies:  Ct Angio Chest Pe W And/or Wo Contrast  Result Date: 01/04/2019 IMPRESSION: 1. No evidence of pulmonary embolus. 2. Small bilateral pleural effusions, left greater than right with bibasilar atelectasis. 3. Moderate pericardial effusion measuring 2.4 cm in thickness along the left ventricle. 4. Age-indeterminate T7 vertebral body compression fracture with approximately 70% anterior height loss. 5.   Aortic Atherosclerosis (ICD10-I70.0). Electronically Signed   By: Kathreen Devoid   On: 01/04/2019 13:34    Assessment and Plan:   1. Pericardial effusion: -Preliminary read on echo shows a large pericardial effusion with some compression -MD had a long discussion with the patient regarding the procedure, risks, and benefits -She is a DNR, though has agreed to suspend this for the procedure  -Dr. Saunders Revel has had a length discussion with the patient's daughter over the phone at the patient's wishes  -Plan for pericardiocentesis this evening with Dr. Saunders Revel with close monitoring of drain output with plan for repeat echo first of next week -Pericardial fluid to be sent to the lab and pathology -Cannot exclude hemorrhagic/malignant effusion at this time given drop in HGB noted -Not currently on [facility] or antiplatelet therapy  -This will likely help with her symptoms, though not likely resolve them given her underlying pleural effusion and anemia    2. Pleural effusion: -May ultimately require therapeutic thoracentesis  -Echo as above -Escalate evidence based therapy as indicated and as vitals allow   3. Anemia: -HGB has dropped from 12.5 on 12/24/2018 to 9.7 as of today -Further work up pending the above pericardial fluid analysis and potential pleural fluid analysis  -No urgent indication for transfusion at this time   4.  AKI: -Monitor -Avoid nephrotoxic agents    For questions or updates, please contact [place] Please consult [email or web address] for contact info under Cardiology/STEMI.   Signed, Christell Faith, PA-C [facility] Pager: [phone] 01/04/2019, 2:50 PM

## 2019-01-04 NOTE — ED Notes (Signed)
Pure wick in place 

## 2019-01-04 NOTE — H&P (Signed)
Bridget Mendez at [place] NAME: Bridget Mendez    MR#:  [id number]  DATE OF BIRTH:  11-Apr-1920  DATE OF ADMISSION:  01/04/2019  PRIMARY CARE PHYSICIAN: Venia Carbon, MD   REQUESTING/REFERRING PHYSICIAN: Harvest Dark, MD  CHIEF COMPLAINT:   Chief Complaint  Patient presents with  . Shortness of Breath    HISTORY OF PRESENT ILLNESS:  Bridget Mendez  is a 85 y.o. female with a known history of anxiety, depression, hx compression fracture who presented to the ED with shortness of breath. She was seen in the ED on 12/24/18 and was diagnosed with a pneumonia. She was prescribed Levaquin. She followed up with her PCP on 12/27/18 and her antibiotic course was extended. She was also started on 2L O2 by [place] at home. Her shortness of breath has continued to worsen. She denies any chest pain. She endorses mid back pain. No nausea or vomiting.  In the ED, she was tachypneic and tachycardic. Labs were significant for sodium 129, creatinine 1.15, hgb 9.7. CTA chest showed moderate pericardial effusion and age-indeterminate T7 vertebral body compression fracture. Hospitalists were called for admission.  PAST MEDICAL HISTORY:   Past Medical History:  Diagnosis Date  . Allergy   . Anxiety   . Dental bridge present    permanent, bottom  . Depression   . GERD (gastroesophageal reflux disease)   . Lumbar compression fracture ([place]) 04/2018  . Osteoarthritis   . Osteoporosis   . PVC (premature ventricular contraction)     PAST SURGICAL HISTORY:   Past Surgical History:  Procedure Laterality Date  . CATARACT EXTRACTION W/PHACO Right 08/15/2018   Procedure: CATARACT EXTRACTION PHACO AND INTRAOCULAR LENS PLACEMENT ([place])  RIGHT;  Surgeon: Leandrew Koyanagi, MD;  Location: [facility];  Service: Ophthalmology;  Laterality: Right;  requests arrival around 10AM  . CATARACT EXTRACTION W/PHACO Left 09/12/2018   Procedure: CATARACT EXTRACTION PHACO  AND INTRAOCULAR LENS PLACEMENT ([place])  LEFT;  Surgeon: Leandrew Koyanagi, MD;  Location: [facility];  Service: Ophthalmology;  Laterality: Left;  Requests arrival around 10 AM  . CESAREAN SECTION     x2  . HEMORRHOID SURGERY    . INGUINAL HERNIA REPAIR  02/2009   Dr.Wilton Tamala Julian  . KNEE ARTHROSCOPY  1998 & 2005   both knees  . KYPHOPLASTY N/A 04/24/2018   Procedure: KYPHOPLASTY;  Surgeon: Hessie Knows, MD;  Location: [facility];  Service: Orthopedics;  Laterality: N/A;  . REPLACEMENT TOTAL KNEE Right 2008  . TOTAL HIP ARTHROPLASTY Right 07/29/2014   Procedure: RIGHT TOTAL HIP ARTHROPLASTY ANTERIOR APPROACH;  Surgeon: Hessie Dibble, MD;  Location: [facility];  Service: Orthopedics;  Laterality: Right;    SOCIAL HISTORY:   Social History   Tobacco Use  . Smoking status: Former Smoker    Packs/day: 1.00    Types: Cigarettes    Quit date: 07/11/1988    Years since quitting: 30.5  . Smokeless tobacco: Never Used  Substance Use Topics  . Alcohol use: Not Currently    FAMILY HISTORY:   Family History  Problem Relation Age of Onset  . Parkinsonism Mother   . Cancer Maternal Grandmother     DRUG ALLERGIES:  No Known Allergies  REVIEW OF SYSTEMS:   Review of Systems  Constitutional: Negative for chills and fever.  HENT: Negative for congestion and sore throat.   Eyes: Negative for blurred vision and double vision.  Respiratory: Positive for shortness of breath. Negative for  cough.   Cardiovascular: Negative for chest pain, palpitations and leg swelling.  Gastrointestinal: Negative for nausea and vomiting.  Genitourinary: Negative for dysuria and urgency.  Musculoskeletal: Positive for back pain. Negative for neck pain.  Neurological: Negative for dizziness and headaches.  Psychiatric/Behavioral: Negative for depression. The patient is not nervous/anxious.     MEDICATIONS AT HOME:   Prior to Admission medications   Medication Sig Start Date End Date Taking?  Authorizing Provider  alendronate (FOSAMAX) 70 MG tablet Take 70 mg by mouth once a week. Take with a full glass of water on an empty stomach.   Yes [provider]  ALPRAZolam (XANAX) 0.5 MG tablet TAKE 1/2 TO 1 TABLET BY MOUTH TWICE DAILY AS NEEDED FOR ANXIETY 03/13/18  Yes Venia Carbon, MD  cholecalciferol (VITAMIN D3) 25 MCG (1000 UT) tablet Take 1,000 Units by mouth daily.   Yes [provider]  glucosamine-chondroitin 500-400 MG tablet Take 1 tablet by mouth daily.   Yes [provider]  metoprolol succinate (TOPROL-XL) 25 MG 24 hr tablet Take 1 tablet (25 mg total) by mouth daily. Take with or immediately following a meal. 12/19/17  Yes Gollan, Kathlene November, MD  Multiple Vitamin (MULTIVITAMIN) tablet Take 1 tablet by mouth daily.    Yes [provider]  Probiotic Product (PROBIOTIC DAILY) CAPS Take 1 capsule by mouth daily.    Yes [provider]  calcium carbonate (TUMS EX) 750 MG chewable tablet Chew 1-2 tablets by mouth daily as needed.     [provider]  HYDROcodone-acetaminophen (NORCO) 5-325 MG tablet Take 1 tablet by mouth every 6 (six) hours as needed. Patient not taking: Reported on 01/04/2019 04/24/18   Hessie Knows, MD  methylcellulose oral powder Take 1 packet by mouth daily.     [provider]      VITAL SIGNS:  Blood pressure 123/84, pulse (!) 108, resp. rate (!) 24, height 5' 3.5" (1.613 m), weight 72.6 kg, SpO2 93 %.  PHYSICAL EXAMINATION:  Physical Exam  GENERAL:  85 y.o.-year-old patient lying in the bed with no acute distress.  EYES: Pupils equal, round, reactive to light and accommodation. No scleral icterus. Extraocular muscles intact.  HEENT: Head atraumatic, normocephalic. Oropharynx and nasopharynx clear.  NECK:  Supple, no jugular venous distention. No thyroid enlargement, no tenderness.  LUNGS: Normal breath sounds bilaterally, no wheezing, rales,rhonchi or crepitation. No use of accessory  muscles of respiration.  CARDIOVASCULAR: Tachycardic, regular rhythm, S1, S2 normal. No murmurs, rubs, or gallops.  ABDOMEN: Soft, nontender, nondistended. Bowel sounds present. No organomegaly or mass.  EXTREMITIES: No pedal edema, cyanosis, or clubbing.  NEUROLOGIC: Cranial nerves II through XII are intact. +global weakness. Sensation intact. Gait not checked.  PSYCHIATRIC: The patient is alert and oriented x 3.  SKIN: No obvious rash, lesion, or ulcer.   LABORATORY PANEL:   CBC Recent Labs  Lab 01/04/19 1121  WBC 8.2  HGB 9.7*  HCT 29.8*  PLT 509*   ------------------------------------------------------------------------------------------------------------------  Chemistries  Recent Labs  Lab 01/04/19 1121  NA 129*  K 5.1  CL 95*  CO2 22  GLUCOSE 121*  BUN 22  CREATININE 1.15*  CALCIUM 8.1*  AST 26  ALT 30  ALKPHOS 161*  BILITOT 0.6   ------------------------------------------------------------------------------------------------------------------  Cardiac Enzymes No results for input(s): TROPONINI in the last 168 hours. ------------------------------------------------------------------------------------------------------------------  RADIOLOGY:  Ct Angio Chest Pe W And/or Wo Contrast  Result Date: 01/04/2019 CLINICAL DATA:  Shortness of breath, diagnosed with pneumonia last  week EXAM: CT ANGIOGRAPHY CHEST WITH CONTRAST TECHNIQUE: Multidetector CT imaging of the chest was performed using the standard protocol during bolus administration of intravenous contrast. Multiplanar CT image reconstructions and MIPs were obtained to evaluate the vascular anatomy. CONTRAST:  69mL OMNIPAQUE IOHEXOL 350 MG/ML SOLN COMPARISON:  12/24/2018 chest x-ray FINDINGS: Cardiovascular: Satisfactory opacification of the pulmonary arteries to the segmental level. No evidence of pulmonary embolism. Normal heart size. Moderate pericardial effusion measuring 2.4 cm in thickness along the left  ventricle. Thoracic aortic atherosclerosis. Mediastinum/Nodes: No enlarged mediastinal, hilar, or axillary lymph nodes. Thyroid gland, trachea, and esophagus demonstrate no significant findings. Lungs/Pleura: Small bilateral pleural effusions with bibasilar atelectasis. No focal consolidation to suggest pneumonia. No pneumothorax. Upper Abdomen: No acute abnormality. Musculoskeletal: No aggressive osseous lesion. Age-indeterminate T7 vertebral body compression fracture. Review of the MIP images confirms the above findings. IMPRESSION: 1. No evidence of pulmonary embolus. 2. Small bilateral pleural effusions, left greater than right with bibasilar atelectasis. 3. Moderate pericardial effusion measuring 2.4 cm in thickness along the left ventricle. 4. Age-indeterminate T7 vertebral body compression fracture with approximately 70% anterior height loss. 5.  Aortic Atherosclerosis (ICD10-I70.0). Electronically Signed   By: Kathreen Devoid   On: 01/04/2019 13:34      IMPRESSION AND PLAN:   New moderate pericardial effusion- seen on CTA chest. Concern that it may be a bloody pericardial effusion due to her drop in Hgb. -Cardiology consult- plan for pericardiocentesis tonight -ECHO ordered  T7 compression fracture, age-indeterminate- seen on CTA chest -Continue tylenol and norco prn pain  Acute blood loss anemia- hgb dropped from 12.5 on 6/15 to 9.7 on admission. No active bleeding. -Check anemia panel and FOBT -Trend Hgb -Holding anticoagulation  Hypertension- BPs controlled -Continue home metoprolol  Chronic anxiety- stable -Continue home xanax  GERD -Continue home protonix and tums  CKD III- creatinine at baseline -Avoid nephrotoxic agents -Monitor  DVT prophylaxis- SCDs  All the records are reviewed and case discussed with ED provider. Management plans discussed with the patient, family and they are in agreement.  CODE STATUS: DNR- but patient agrees to be intubated for a short period  of time if necessary surrounding the pericardiocentesis  TOTAL TIME TAKING CARE OF THIS PATIENT: 45 minutes.    Berna Spare Tyjai Charbonnet M.D on 01/04/2019 at 3:19 PM  Between 7am to 6pm - Pager - [phone]  After 6pm go to [email or web address] - Proofreader  [facility]  Office  [phone]  CC: Primary care physician; Venia Carbon, MD   Note: This dictation was prepared with Dragon dictation along with smaller phrase technology. Any transcriptional errors that result from this process are unintentional.

## 2019-01-04 NOTE — ED Notes (Signed)
Ultrasound tech for echo at bedside. Will medicate when exam is finished.

## 2019-01-04 NOTE — ED Notes (Signed)
Patient's watch and bracelet are in a specimen bag and placed in her purse.

## 2019-01-04 NOTE — H&P (View-Only) (Signed)
Cardiology Consultation:   Patient ID: Bridget Mendez; [id number]; [birth year]   Admit date: 01/04/2019 Date of Consult: 01/04/2019  Primary Care Provider: Venia Carbon, MD Primary Cardiologist: Rockey Situ   Patient Profile:   Bridget Mendez is a 85 y.o. female with a hx of PVCs, left bundle branch block, anxiety, depression, lumbar compression fracture, osteoporosis, and GERD who is being seen today for the evaluation of pericardial effusion at the request of Dr. Kerman Passey.  History of Present Illness:   Ms. Kearl was evaluated by Dr. Rockey Situ in 05/2017 for evaluation of arrhythmia.  She recently been in the hospital with associated weakness and fatigue in late 05/2017 with associated near syncope.  She was seen by outside cardiology group and told she had A. fib.  Dr. Donivan Scull review of available data showed no evidence of A. fib.  Echo during that admission showed an EF of 60 to 65% with a calcified mitral annulus.  She was started on low-dose metoprolol for PVCs.  More recently, the patient was seen in the ED on 12/24/2018 with chest pain.  Notes indicate she reached up to grab her shower cap with her right arm and felt a popping sensation in her chest leading to sharp chest pain.  Troponin negative x2 at that time.  EKG not scanned in for review.  EKG showed left lower lobe airspace disease concerning for pneumonia with addendum to report from today's ED visit indicating an age-indeterminate T7 vertebral body compression fracture.  She was treated for pneumonia.  Despite continued antibiotic therapy and addition of supplemental oxygen at 2 L via nasal cannula she has continued to note worsening SOB, now to the point that she cannot participate in ADLs. Of note, the patient reported a lesion on the right breast for the past several months that has not been evaluated. She denies any mammograms over the past several years. She denies any anticoagulation and stopped ASA about 1 year  prior.   Upon the patient's arrival to [facility] they were found to have stable BP with heart rates in the low 100s bpm. EKG showed sinus tachycardia with no left bundle branch block as outlined below, CTA chest showed no evidence of PE with small bilateral pleural effusions left greater than the right with bibasilar atelectasis, moderate pericardial effusion measuring 2.4 cm in thickness along the LV, age-indeterminate T7 vertebral body compression fracture, and aortic atherosclerosis. Labs showed initial high-sensitivity troponin negative at 8, sodium 129, potassium 5.1, serum creatinine 1.15, albumin 3.0, AST/ALT normal, hemoglobin 9.7 with a value of 12.5 eleven days prior.  Stat echo to evaluate pericardial effusion is currently pending with preliminary read showing a large pericardial effusion.  Past Medical History:  Diagnosis Date   Allergy    Anxiety    Dental bridge present    permanent, bottom   Depression    GERD (gastroesophageal reflux disease)    Lumbar compression fracture ([place]) 04/2018   Osteoarthritis    Osteoporosis    PVC (premature ventricular contraction)     Past Surgical History:  Procedure Laterality Date   CATARACT EXTRACTION W/PHACO Right 08/15/2018   Procedure: CATARACT EXTRACTION PHACO AND INTRAOCULAR LENS PLACEMENT ([place])  RIGHT;  Surgeon: Leandrew Koyanagi, MD;  Location: [facility];  Service: Ophthalmology;  Laterality: Right;  requests arrival around [place] Left 09/12/2018   Procedure: CATARACT EXTRACTION PHACO AND INTRAOCULAR LENS PLACEMENT ([place])  LEFT;  Surgeon: Leandrew Koyanagi, MD;  Location: [facility];  Service: Ophthalmology;  Laterality: Left;  Requests arrival around 10 AM   CESAREAN SECTION     x2   [place]  02/2009   Dr.Wilton Tamala Julian   KNEE ARTHROSCOPY  1998 & 2005   both knees   KYPHOPLASTY N/A 04/24/2018   Procedure: KYPHOPLASTY;  Surgeon: Hessie Knows, MD;  Location: [facility];  Service: Orthopedics;  Laterality: N/A;   REPLACEMENT TOTAL KNEE Right 2008   TOTAL HIP ARTHROPLASTY Right 07/29/2014   Procedure: RIGHT TOTAL HIP ARTHROPLASTY ANTERIOR APPROACH;  Surgeon: Hessie Dibble, MD;  Location: [facility];  Service: Orthopedics;  Laterality: Right;     Home Meds: Prior to Admission medications   Medication Sig Start Date End Date Taking? Authorizing Provider  alendronate (FOSAMAX) 70 MG tablet Take 70 mg by mouth once a week. Take with a full glass of water on an empty stomach.    [provider]  ALPRAZolam (XANAX) 0.5 MG tablet TAKE 1/2 TO 1 TABLET BY MOUTH TWICE DAILY AS NEEDED FOR ANXIETY 03/13/18   Venia Carbon, MD  amoxicillin (AMOXIL) 500 MG capsule Take 2,000 mg by mouth as needed (prior to dental appointment).    [provider]  calcium carbonate (TUMS EX) 750 MG chewable tablet Chew 1-2 tablets by mouth daily as needed.     [provider]  cholecalciferol (VITAMIN D3) 25 MCG (1000 UT) tablet Take 1,000 Units by mouth daily.    [provider]  glucosamine-chondroitin 500-400 MG tablet Take 1 tablet by mouth daily.    [provider]  HYDROcodone-acetaminophen (NORCO) 5-325 MG tablet Take 1 tablet by mouth every 6 (six) hours as needed. 04/24/18   Hessie Knows, MD  methylcellulose oral powder Take 1 packet by mouth daily.     [provider]  metoprolol succinate (TOPROL-XL) 25 MG 24 hr tablet Take 1 tablet (25 mg total) by mouth daily. Take with or immediately following a meal. 12/19/17   Gollan, Kathlene November, MD  Multiple Vitamin (MULTIVITAMIN) tablet Take 1 tablet by mouth daily.     [provider]  Probiotic Product (PROBIOTIC DAILY) CAPS Take 1 capsule by mouth daily.     [provider]    Inpatient Medications: Scheduled Meds:  furosemide  20 mg Intravenous Once   Continuous Infusions:  PRN Meds:   Allergies:  No Known  Allergies  Social History:   Social History   Socioeconomic History   Marital status: Widowed    Spouse name: Not on file   Number of children: 2   Years of education: Not on file   Highest education level: Not on file  Occupational History   Occupation: retired  Scientist, product/process development strain: Not on file   Food insecurity    Worry: Not on file    Inability: Not on file   Transportation needs    Medical: Not on file    Non-medical: Not on file  Tobacco Use   Smoking status: Former Smoker    Packs/day: 1.00    Types: Cigarettes    Quit date: 07/11/1988    Years since quitting: 30.5   Smokeless tobacco: Never Used  Substance and Sexual Activity   Alcohol use: Not Currently   Drug use: No   Sexual activity: Not on file  Lifestyle   Physical activity    Days per week: Not on file    Minutes per session: Not on  file   Stress: Not on file  Relationships   Social connections    Talks on phone: Not on file    Gets together: Not on file    Attends religious service: Not on file    Active member of club or organization: Not on file    Attends meetings of clubs or organizations: Not on file    Relationship status: Not on file   Intimate partner violence    Fear of current or ex partner: Not on file    Emotionally abused: Not on file    Physically abused: Not on file    Forced sexual activity: Not on file  Other Topics Concern   Not on file  Social History Narrative   Has living will   Daughter, [name], is health care POA--- son [name] is alternate   Has DNR already--reviewed   No tube feeds if cognitively aware        Family History:   Family History  Problem Relation Age of Onset   Parkinsonism Mother    Cancer Maternal Grandmother     ROS:  Review of Systems  Constitutional: Positive for malaise/fatigue. Negative for chills, diaphoresis, fever and weight loss.  HENT: Negative for congestion.   Eyes: Negative for discharge and  redness.  Respiratory: Positive for shortness of breath. Negative for cough, hemoptysis, sputum production and wheezing.   Cardiovascular: Positive for chest pain and leg swelling. Negative for palpitations, orthopnea, claudication and PND.  Gastrointestinal: Negative for abdominal pain, blood in stool, heartburn, melena, nausea and vomiting.  Genitourinary: Negative for hematuria.  Musculoskeletal: Negative for falls and myalgias.  Skin: Negative for rash.  Neurological: Positive for weakness. Negative for dizziness, tingling, tremors, sensory change, speech change, focal weakness and loss of consciousness.  Endo/Heme/Allergies: Does not bruise/bleed easily.  Psychiatric/Behavioral: Negative for substance abuse. The patient is not nervous/anxious.   All other systems reviewed and are negative.     Physical Exam/Data:   Vitals:   01/04/19 1130 01/04/19 1230 01/04/19 1400 01/04/19 1430  BP: 116/86 123/84 (!) 121/91 123/84  Pulse: (!) 105 (!) 109 (!) 109 (!) 108  Resp: (!) 23 (!) 30 (!) 27 (!) 24  SpO2: 95% 95% 97% 93%  Weight:      Height:       No intake or output data in the 24 hours ending 01/04/19 1450 Filed Weights   01/04/19 1101  Weight: 72.6 kg   Body mass index is 27.9 kg/m.   Physical Exam: General: Elderly appearing and noticeably SOB speaking in 2-3 word phraes. Head: Normocephalic, atraumatic, sclera non-icteric, no xanthomas, nares without discharge.  Neck: Negative for carotid bruits. JVD not elevated. Lungs: Diminished breath sounds along the bilateral bases. Breathing is mildly labored. Heart: RRR with S1 S2. No murmurs, rubs, or gallops appreciated. Abdomen: Soft, non-tender, non-distended with normoactive bowel sounds. No hepatomegaly. No rebound/guarding. No obvious abdominal masses. Msk:  Strength and tone appear normal for age. Extremities: No clubbing or cyanosis. Trace bilateral pretibial edema. Distal pedal pulses are 2+ and equal bilaterally. Neuro:  Alert and oriented X 3. No facial asymmetry. No focal deficit. Moves all extremities spontaneously. Psych:  Responds to questions appropriately with a normal affect.   EKG:  The EKG was personally reviewed and demonstrates: sinus tachycardia, 110 bpm, 1st degree AV block, LBBB Telemetry:  Telemetry was personally reviewed and demonstrates: sinus tachycardia, low 100s bpm  Weights: Filed Weights   01/04/19 1101  Weight: 72.6 kg  Relevant CV Studies: 2D Echo 05/2017: - Left ventricle: Systolic function was normal. The estimated   ejection fraction was in the range of 60% to 65%. - Aortic valve: Valve area (VTI): 1.58 cm^2. Valve area (Vmax):   1.51 cm^2. Valve area (Vmean): 1.47 cm^2. - Mitral valve: Calcified annulus. __________  2D Echo this admission pending  Laboratory Data:  Chemistry Recent Labs  Lab 01/04/19 1121  NA 129*  K 5.1  CL 95*  CO2 22  GLUCOSE 121*  BUN 22  CREATININE 1.15*  CALCIUM 8.1*  GFRNONAA 40*  GFRAA 47*  ANIONGAP 12    Recent Labs  Lab 01/04/19 1121  PROT 6.7  ALBUMIN 3.0*  AST 26  ALT 30  ALKPHOS 161*  BILITOT 0.6   Hematology Recent Labs  Lab 01/04/19 1121  WBC 8.2  RBC 3.07*  HGB 9.7*  HCT 29.8*  MCV 97.1  MCH 31.6  MCHC 32.6  RDW 15.0  PLT 509*   Cardiac EnzymesNo results for input(s): TROPONINI in the last 168 hours. No results for input(s): TROPIPOC in the last 168 hours.  BNPNo results for input(s): BNP, PROBNP in the last 168 hours.  DDimer No results for input(s): DDIMER in the last 168 hours.  Radiology/Studies:  Ct Angio Chest Pe W And/or Wo Contrast  Result Date: 01/04/2019 IMPRESSION: 1. No evidence of pulmonary embolus. 2. Small bilateral pleural effusions, left greater than right with bibasilar atelectasis. 3. Moderate pericardial effusion measuring 2.4 cm in thickness along the left ventricle. 4. Age-indeterminate T7 vertebral body compression fracture with approximately 70% anterior height loss. 5.   Aortic Atherosclerosis (ICD10-I70.0). Electronically Signed   By: Kathreen Devoid   On: 01/04/2019 13:34    Assessment and Plan:   1. Pericardial effusion: -Preliminary read on echo shows a large pericardial effusion with some compression -MD had a long discussion with the patient regarding the procedure, risks, and benefits -She is a DNR, though has agreed to suspend this for the procedure  -Dr. Saunders Revel has had a length discussion with the patient's daughter over the phone at the patient's wishes  -Plan for pericardiocentesis this evening with Dr. Saunders Revel with close monitoring of drain output with plan for repeat echo first of next week -Pericardial fluid to be sent to the lab and pathology -Cannot exclude hemorrhagic/malignant effusion at this time given drop in HGB noted -Not currently on [facility] or antiplatelet therapy  -This will likely help with her symptoms, though not likely resolve them given her underlying pleural effusion and anemia    2. Pleural effusion: -May ultimately require therapeutic thoracentesis  -Echo as above -Escalate evidence based therapy as indicated and as vitals allow   3. Anemia: -HGB has dropped from 12.5 on 12/24/2018 to 9.7 as of today -Further work up pending the above pericardial fluid analysis and potential pleural fluid analysis  -No urgent indication for transfusion at this time   4.  AKI: -Monitor -Avoid nephrotoxic agents    For questions or updates, please contact [place] Please consult [email or web address] for contact info under Cardiology/STEMI.   Signed, Christell Faith, PA-C [facility] Pager: [phone] 01/04/2019, 2:50 PM

## 2019-01-04 NOTE — ED Triage Notes (Signed)
Pt to ER via EMS from [facility] with c/o continued [facility].  Pt was seen last week and diagnosed with pneumonia.  Pt has completed ABX and other medications, but has not improvement.  Dr. Kerman Passey to room to assess pt.

## 2019-01-04 NOTE — Progress Notes (Signed)
Family Meeting Note  Advance Directive:yes  Today a meeting took place with the Patient.  Patient is able to participate.  The following clinical team members were present during this meeting:MD  The following were discussed:Patient's diagnosis: pericardial effusion, Patient's progosis: Unable to determine and Goals for treatment: DNR   Patient would like to be DNR, but she is okay with cardiopulmonary resuscitation and intubation surrounding her pericardiocentesis.   Additional follow-up to be provided: prn  Time spent during discussion:20 minutes  Evette Doffing, MD

## 2019-01-04 NOTE — ED Provider Notes (Signed)
[facility] Emergency Department Provider Note  Time seen: 11:03 AM  I have reviewed the triage vital signs and the nursing notes.   HISTORY  Chief Complaint Shortness of Breath   HPI Bridget Mendez is a 85 y.o. female with a past medical history anxiety, depression, gastric reflux, presents to the emergency department for shortness of breath.  According to the patient over the past 1.5 weeks she has felt very short of breath, was seen in the emergency department was diagnosed with pneumonia discharged on Levaquin.  Has been following up with her PCP, the PCP has prolong the antibiotics, has started the patient on 2 L of oxygen at home, but states that patient continues to deteriorate and is having more difficulty breathing so we sent her to the emergency department hoping for a CT scan of the chest to help rule out additional pathology such as pulmonary embolism.  Here patient appears overall well she has somewhat short of breath appearing speaking in 2-3 word sentences.  Patient also complains of mild central chest pain that started approximately 10 days ago.  Denies any fever at any point.  Denies any significant cough.   Past Medical History:  Diagnosis Date  . Allergy   . Anxiety   . Dental bridge present    permanent, bottom  . Depression   . GERD (gastroesophageal reflux disease)   . Lumbar compression fracture ([place]) 04/2018  . Osteoarthritis   . Osteoporosis   . PVC (premature ventricular contraction)     Patient Active Problem List   Diagnosis Date Noted  . Lobar pneumonia ([place]) 12/27/2018  . Chronic narcotic dependence ([place]) 12/27/2018  . SVT (supraventricular tachycardia) ([place]) 07/06/2018  . Episodic mood disorder ([place]) 04/28/2010  . ALLERGIC RHINITIS 09/15/2008  . GERD 10/24/2006  . Primary osteoarthritis involving multiple joints 10/24/2006  . Osteoporosis 10/24/2006    Past Surgical History:  Procedure Laterality Date  . CATARACT  EXTRACTION W/PHACO Right 08/15/2018   Procedure: CATARACT EXTRACTION PHACO AND INTRAOCULAR LENS PLACEMENT ([place])  RIGHT;  Surgeon: Leandrew Koyanagi, MD;  Location: [place];  Service: Ophthalmology;  Laterality: Right;  requests arrival around 10AM  . CATARACT EXTRACTION W/PHACO Left 09/12/2018   Procedure: CATARACT EXTRACTION PHACO AND INTRAOCULAR LENS PLACEMENT ([place])  LEFT;  Surgeon: Leandrew Koyanagi, MD;  Location: [place];  Service: Ophthalmology;  Laterality: Left;  Requests arrival around 10 AM  . CESAREAN SECTION     x2  . HEMORRHOID SURGERY    . INGUINAL HERNIA REPAIR  02/2009   Dr.Wilton Tamala Julian  . KNEE ARTHROSCOPY  1998 & 2005   both knees  . KYPHOPLASTY N/A 04/24/2018   Procedure: KYPHOPLASTY;  Surgeon: Hessie Knows, MD;  Location: [facility];  Service: Orthopedics;  Laterality: N/A;  . REPLACEMENT TOTAL KNEE Right 2008  . TOTAL HIP ARTHROPLASTY Right 07/29/2014   Procedure: RIGHT TOTAL HIP ARTHROPLASTY ANTERIOR APPROACH;  Surgeon: Hessie Dibble, MD;  Location: [place];  Service: Orthopedics;  Laterality: Right;    Prior to Admission medications   Medication Sig Start Date End Date Taking? Authorizing Provider  alendronate (FOSAMAX) 70 MG tablet Take 70 mg by mouth once a week. Take with a full glass of water on an empty stomach.    [provider]  ALPRAZolam [name]) 0.5 MG tablet TAKE 1/2 TO 1 TABLET BY MOUTH TWICE DAILY AS NEEDED FOR ANXIETY 03/13/18   Venia Carbon, MD  amoxicillin (AMOXIL) 500 MG capsule Take 2,000 mg  by mouth as needed (prior to dental appointment).    [provider]  calcium carbonate (TUMS EX) 750 MG chewable tablet Chew 1-2 tablets by mouth daily as needed.     [provider]  cholecalciferol (VITAMIN D3) 25 MCG (1000 UT) tablet Take 1,000 Units by mouth daily.    [provider]  glucosamine-chondroitin 500-400 MG tablet Take 1 tablet by mouth daily.    [provider]   HYDROcodone-acetaminophen (NORCO) 5-325 MG tablet Take 1 tablet by mouth every 6 (six) hours as needed. 04/24/18   Hessie Knows, MD  methylcellulose oral powder Take 1 packet by mouth daily.     [provider]  metoprolol succinate (TOPROL-XL) 25 MG 24 hr tablet Take 1 tablet (25 mg total) by mouth daily. Take with or immediately following a meal. 12/19/17   Gollan, Kathlene November, MD  Multiple Vitamin (MULTIVITAMIN) tablet Take 1 tablet by mouth daily.     [provider]  Probiotic Product (PROBIOTIC DAILY) CAPS Take 1 capsule by mouth daily.     [provider]    No Known Allergies  Family History  Problem Relation Age of Onset  . Parkinsonism Mother   . Cancer Maternal Grandmother     Social History Social History   Tobacco Use  . Smoking status: Former Smoker    Packs/day: 1.00    Types: Cigarettes    Quit date: 07/11/1988    Years since quitting: 30.5  . Smokeless tobacco: Never Used  Substance Use Topics  . Alcohol use: Not Currently  . Drug use: No    Review of Systems Constitutional: Negative for fever. ENT: Negative for recent illness/congestion Cardiovascular: Positive for central to left-sided sharp chest pain x10 days. Respiratory: Positive for shortness of breath.  Negative for cough. Gastrointestinal: Negative for abdominal pain, vomiting  Musculoskeletal: Negative for leg pain Skin: Negative for skin complaints  Neurological: Negative for headache All other ROS negative  ____________________________________________   PHYSICAL EXAM:  Constitutional: Alert and oriented. Well appearing and in no distress. Eyes: Normal exam ENT      Head: Normocephalic and atraumatic.      Mouth/Throat: Mucous membranes are moist. Cardiovascular: Normal rate, regular rhythm Respiratory: Normal respiratory effort without tachypnea nor retractions. Breath sounds are clear Gastrointestinal: Soft and nontender. No distention.  Musculoskeletal:  Nontender with normal range of motion in all extremities.  Neurologic:  Normal speech and language. No gross focal neurologic deficits  Skin:  Skin is warm, dry and intact.  Psychiatric: Mood and affect are normal.   ____________________________________________    EKG  EKG viewed and interpreted by myself shows a sinus rhythm at 110 bpm with a widened QRS, normal axis, largely normal intervals.  Nonspecific ST changes.  ____________________________________________    RADIOLOGY  CTA negative for PE does show bilateral small pleural effusions as well as a moderate pericardial effusion.   ____________________________________________   INITIAL IMPRESSION / ASSESSMENT AND PLAN / ED COURSE  Pertinent labs & imaging results that were available during my care of the patient were reviewed by me and considered in my medical decision making (see chart for details).   Patient presents emergency department for chest pain or shortness of breath that started approximately 10 days ago.  Patient has been on antibiotics for this entire time with no improvement, PCP is seen started the patient on oxygen at home, patient states she continues to feel short of breath if anything she feels like her shortness of breath  is worsening.  Currently patient appears overall well, she does appear somewhat short of breath speaking in 2-3 word sentences.  States central chest pain which is unchanged over the past 10 days but it is mild in severity.  Denies any fever.  We will recheck labs including cardiac enzymes obtain EKG and proceed with CT angiography of the chest to rule out pulmonary embolism.  Patient agreeable to plan of care.  CTA negative for PE but does have a moderate pericardial effusion on CTA.  I reviewed the patient's records and in 2018 she had no pericardial effusion on echocardiogram.  I discussed patient with Dr. end of cardiology, he will consult on the patient and states he will order a repeat  echocardiogram to further evaluate.  We will admit to the hospitalist service with cardiology consultation.  Nariya Neumeyer was evaluated in Emergency Department on 01/04/2019 for the symptoms described in the history of present illness. She was evaluated in the context of the global COVID-19 pandemic, which necessitated consideration that the patient might be at risk for infection with the SARS-CoV-2 virus that causes COVID-19. Institutional protocols and algorithms that pertain to the evaluation of patients at risk for COVID-19 are in a state of rapid change based on information released by regulatory bodies including the CDC and federal and state organizations. These policies and algorithms were followed during the patient's care in the ED.  ____________________________________________   FINAL CLINICAL IMPRESSION(S) / ED DIAGNOSES  Dyspnea Chest pain Pericardial effusion   Harvest Dark, MD 01/04/19 1459

## 2019-01-04 NOTE — ED Notes (Signed)
Report given to cath lab. 

## 2019-01-05 ENCOUNTER — Inpatient Hospital Stay (HOSPITAL_COMMUNITY)
Admit: 2019-01-05 | Discharge: 2019-01-05 | Disposition: A | Discharge status: Home / Self Care | Payer: Medicare Other | Attending: Internal Medicine | Admitting: Internal Medicine

## 2019-01-05 DIAGNOSIS — I313 Pericardial effusion (noninflammatory): Secondary | ICD-10-CM

## 2019-01-05 LAB — IRON AND TIBC
Iron: 25 ug/dL — ABNORMAL LOW (ref 28–170)
Saturation Ratios: 11 % (ref 10.4–31.8)
TIBC: 225 ug/dL — ABNORMAL LOW (ref 250–450)
UIBC: 200 ug/dL

## 2019-01-05 LAB — BASIC METABOLIC PANEL
Anion gap: 11 (ref 5–15)
BUN: 19 mg/dL (ref 8–23)
CO2: 23 mmol/L (ref 22–32)
Calcium: 8.1 mg/dL — ABNORMAL LOW (ref 8.9–10.3)
Chloride: 101 mmol/L (ref 98–111)
Creatinine, Ser: 1.17 mg/dL — ABNORMAL HIGH (ref 0.44–1.00)
GFR calc Af Amer: 46 mL/min — ABNORMAL LOW (ref >60)
GFR calc non Af Amer: 39 mL/min — ABNORMAL LOW (ref >60)
Glucose, Bld: 141 mg/dL — ABNORMAL HIGH (ref 70–99)
Potassium: 4.4 mmol/L (ref 3.5–5.1)
Sodium: 135 mmol/L (ref 135–145)

## 2019-01-05 LAB — FOLATE: Folate: 35 ng/mL (ref >5.9)

## 2019-01-05 LAB — ECHOCARDIOGRAM LIMITED
Height: 63 in
Weight: 2691.38 oz

## 2019-01-05 LAB — FERRITIN: Ferritin: 667 ng/mL — ABNORMAL HIGH (ref 11–307)

## 2019-01-05 LAB — PROTIME-INR
INR: 1.2 (ref 0.8–1.2)
Prothrombin Time: 14.6 seconds (ref 11.4–15.2)

## 2019-01-05 LAB — VITAMIN B12: Vitamin B-12: 631 pg/mL (ref 180–914)

## 2019-01-05 MED ORDER — CHLORHEXIDINE GLUCONATE CLOTH 2 % EX PADS
6.0000 | MEDICATED_PAD | Freq: Every day | CUTANEOUS | Status: DC
  Administered 2019-01-05 – 2019-01-06 (×2): 6 via TOPICAL

## 2019-01-05 NOTE — Progress Notes (Signed)
Progress Note   Subjective   Doing well today, the patient denies CP or SOB.  No new concerns  Inpatient Medications    Scheduled Meds: . acidophilus  1 capsule Oral Daily  . Chlorhexidine Gluconate Cloth  6 each Topical Daily  . cholecalciferol  1,000 Units Oral Daily  . docusate sodium  100 mg Oral BID  . metoprolol succinate  25 mg Oral Daily  . multivitamin with minerals  1 tablet Oral Daily  . pantoprazole  40 mg Oral Daily  . psyllium  1 packet Oral Daily   Continuous Infusions: . sodium chloride 50 mL/hr at 01/05/19 1334   PRN Meds: acetaminophen **OR** acetaminophen, albuterol, ALPRAZolam, calcium carbonate, HYDROcodone-acetaminophen, nitroGLYCERIN, ondansetron **OR** ondansetron (ZOFRAN) IV, polyethylene glycol   Vital Signs    Vitals:   01/05/19 1100 01/05/19 1201 01/05/19 1225 01/05/19 1300  BP: 103/84 105/77  103/65  Pulse: (!) 103 (!) 103  (!) 111  Resp: (!) 30 (!) 28  (!) 29  Temp:   97.6 F (36.4 C)   TempSrc:   Oral   SpO2: 93% 95%  95%  Weight:      Height:        Intake/Output Summary (Last 24 hours) at 01/05/2019 1359 Last data filed at 01/05/2019 1300 Gross per 24 hour  Intake 1306.9 ml  Output 1860 ml  Net -553.1 ml   Filed Weights   01/04/19 1101 01/04/19 1830 01/05/19 0500  Weight: 72.6 kg 74.9 kg 76.3 kg    Telemetry    Sinus tachycardia - Personally Reviewed  Physical Exam   GEN- The patient is elderly appearing, alert and oriented x 3 today.   Head- normocephalic, atraumatic Eyes-  Sclera clear, conjunctiva pink Oropharynx- clear Neck- supple, Lungs-   normal work of breathing Heart- Regular rate and rhythm  GI- soft, NT, ND, + BS Extremities- no clubbing, cyanosis, or edema  Pericardial drain in place, unable to aspirate fluid today   Labs    Chemistry Recent Labs  Lab 01/04/19 1121 01/05/19 0517  NA 129* 135  K 5.1 4.4  CL 95* 101  CO2 22 23  GLUCOSE 121* 141*  BUN 22 19  CREATININE 1.15* 1.17*  CALCIUM  8.1* 8.1*  PROT 6.7  --   ALBUMIN 3.0*  --   AST 26  --   ALT 30  --   ALKPHOS 161*  --   BILITOT 0.6  --   GFRNONAA 40* 39*  GFRAA 47* 46*  ANIONGAP 12 11     Hematology Recent Labs  Lab 01/04/19 1121  WBC 8.2  RBC 3.07*  HGB 9.7*  HCT 29.8*  MCV 97.1  MCH 31.6  MCHC 32.6  RDW 15.0  PLT 509*      Assessment & Plan    1.  Pericardial effusion S/p drainage yesterday Very little output from drain overnight Echo pending to reassess pericardial effusion Will keep drain in place for now. Awaiting results of tests from pericardial fluid  2. Anemia Stable No change required today IM to manage Could consider IV iron if stable tomorrow and echo reveals no effusion  3. Acute renal failure Stable No change required today  4. Acute hypoxic respiratory failure Improved with treatment of tamponade  Very complicated patient who is quite ill.  A high level of decision making was required for this encounter.  Keep in ICU  I did attempt to call daughter [name]) but the call went to VM.  I left  message with status update.    Thompson Grayer MD, [facility] 01/05/2019 1:59 PM

## 2019-01-05 NOTE — Care Plan (Signed)
PCCM has not been formally consulted.  Patient admitted with large pericardial effusion status post pericardial drain.  Has not had any hemodynamic issues.  PCCM available if needed.  Renold Don, MD [facility]

## 2019-01-05 NOTE — Progress Notes (Signed)
Updated patient's daughter Cindi via phone about patient's condition

## 2019-01-05 NOTE — Progress Notes (Signed)
Updated patient's son [name] via phone.

## 2019-01-05 NOTE — Progress Notes (Signed)
Updated daughter Cindi via telephone.

## 2019-01-05 NOTE — Progress Notes (Signed)
[facility] [place] NAME: Bridget Mendez    MR#:  [id number]  DATE OF BIRTH:  [birth year]  SUBJECTIVE:   Less SOB   REVIEW OF SYSTEMS:    Review of Systems  Constitutional: Negative for fever, chills weight loss HENT: Negative for ear pain, nosebleeds, congestion, facial swelling, rhinorrhea, neck pain, neck stiffness and ear discharge.   Respiratory: Negative for cough, shortness of breath, wheezing  Cardiovascular: Negative for chest pain, palpitations and leg swelling.  Gastrointestinal: Negative for heartburn, abdominal pain, vomiting, diarrhea or consitpation Genitourinary: Negative for dysuria, urgency, frequency, hematuria Musculoskeletal: Negative for back pain or joint pain Neurological: Negative for dizziness, seizures, syncope, focal weakness,  numbness and headaches.  Hematological: Does not bruise/bleed easily.  Psychiatric/Behavioral: Negative for hallucinations, confusion, dysphoric mood    Tolerating Diet: yes      DRUG ALLERGIES:  No Known Allergies  VITALS:  Blood pressure 98/66, pulse (!) 106, temperature 98.8 F (37.1 C), temperature source Oral, resp. rate 19, height 5\' 3"  (1.6 m), weight 76.3 kg, SpO2 100 %.  PHYSICAL EXAMINATION:  Constitutional: Appears well-developed and well-nourished. No distress. HENT: Normocephalic. Marland Kitchen Oropharynx is clear and moist.  Eyes: Conjunctivae and EOM are normal. PERRLA, no scleral icterus.  Neck: Normal ROM. Neck supple. No JVD. No tracheal deviation. CVS: RRR, S1/S2 +, no murmurs, no gallops, no carotid bruit.  Has drain placed Pulmonary: Effort and breath sounds normal, no stridor, rhonchi, wheezes, rales.  Abdominal: Soft. BS +,  no distension, tenderness, rebound or guarding.  Musculoskeletal: Normal range of motion. No edema and no tenderness.  Neuro: Alert. CN 2-12 grossly intact. No focal deficits. Skin: Skin is warm and dry. No rash noted. Psychiatric: Normal  mood and affect.      LABORATORY PANEL:   CBC Recent Labs  Lab 01/04/19 1121  WBC 8.2  HGB 9.7*  HCT 29.8*  PLT 509*   ------------------------------------------------------------------------------------------------------------------  Chemistries  Recent Labs  Lab 01/04/19 1121 01/05/19 0517  NA 129* 135  K 5.1 4.4  CL 95* 101  CO2 22 23  GLUCOSE 121* 141*  BUN 22 19  CREATININE 1.15* 1.17*  CALCIUM 8.1* 8.1*  AST 26  --   ALT 30  --   ALKPHOS 161*  --   BILITOT 0.6  --    ------------------------------------------------------------------------------------------------------------------  Cardiac Enzymes No results for input(s): TROPONINI in the last 168 hours. ------------------------------------------------------------------------------------------------------------------  RADIOLOGY:  Ct Angio Chest Pe W And/or Wo Contrast  Result Date: 01/04/2019 CLINICAL DATA:  Shortness of breath, diagnosed with pneumonia last week EXAM: CT ANGIOGRAPHY CHEST WITH CONTRAST TECHNIQUE: Multidetector CT imaging of the chest was performed using the standard protocol during bolus administration of intravenous contrast. Multiplanar CT image reconstructions and MIPs were obtained to evaluate the vascular anatomy. CONTRAST:  21mL OMNIPAQUE IOHEXOL 350 MG/ML SOLN COMPARISON:  12/24/2018 chest x-ray FINDINGS: Cardiovascular: Satisfactory opacification of the pulmonary arteries to the segmental level. No evidence of pulmonary embolism. Normal heart size. Moderate pericardial effusion measuring 2.4 cm in thickness along the left ventricle. Thoracic aortic atherosclerosis. Mediastinum/Nodes: No enlarged mediastinal, hilar, or axillary lymph nodes. Thyroid gland, trachea, and esophagus demonstrate no significant findings. Lungs/Pleura: Small bilateral pleural effusions with bibasilar atelectasis. No focal consolidation to suggest pneumonia. No pneumothorax. Upper Abdomen: No acute abnormality.  Musculoskeletal: No aggressive osseous lesion. Age-indeterminate T7 vertebral body compression fracture. Review of the MIP images confirms the above findings. IMPRESSION: 1. No evidence of pulmonary embolus. 2. Small bilateral  pleural effusions, left greater than right with bibasilar atelectasis. 3. Moderate pericardial effusion measuring 2.4 cm in thickness along the left ventricle. 4. Age-indeterminate T7 vertebral body compression fracture with approximately 70% anterior height loss. 5.  Aortic Atherosclerosis (ICD10-I70.0). Electronically Signed   By: Kathreen Devoid   On: 01/04/2019 13:34   Dg Chest Port 1 View  Result Date: 01/04/2019 CLINICAL DATA:  Status post pericardiocentesis EXAM: PORTABLE CHEST 1 VIEW COMPARISON:  CT PE study dated January 04, 2019 FINDINGS: The heart size appears stable from chest x-ray dated December 24, 2018 but is likely improved when compared to CT dated January 04, 2019. A pericardial drain is now noted projecting over the heart. There is a moderate-sized left-sided pleural effusion. There is a small right-sided pleural effusion. There is atelectasis at the left lung base. There is mild volume overload without overt pulmonary edema. No pneumothorax. A T7 compression fracture is again noted. Aortic calcifications are noted. IMPRESSION: 1. New pericardial drain is noted projecting over the lower heart border. 2. Moderate-sized left-sided pleural effusion. There is a small right-sided pleural effusion. 3. No pneumothorax. Electronically Signed   By: Constance Holster M.D.   On: 01/04/2019 20:19     ASSESSMENT AND PLAN:    85 year old female with a history of osteoporosis who presented to the ER with shortness of breath and found to have pericardial effusion with tamponade physiology.  1.  Acute hypoxic respiratory failure in the setting of pericardial effusion large with early evidence of tamponade physiology which is etiology of patient's shortness of breath (hemopericardium):  Patient is postoperative day #1 pericardiocentesis with drain placed. Follow-up on cardiology recommendations. COVID testing negative.   2.  Acute on chronic anemia in the setting of hemopericardium with iron studies consistent with low iron and iron saturation. Patient would benefit from IV iron if okay with cardiology. Cbc in am   3.  Anxiety: Continue PRN Xanax  4.  History of PVCs: Continue metoprolol  5. Hyponatremia : Sodium level normalized this morning.  Management plans discussed with the patient and she is in agreement.  CODE STATUS: dnr  TOTAL TIME TAKING CARE OF THIS PATIENT: 30 minutes.     POSSIBLE D/C 1-2 days, DEPENDING ON CLINICAL CONDITION.   Bettey Costa M.D on 01/05/2019 at 10:43 AM  Between 7am to 6pm - Pager - [phone] After 6pm go to [email or web address] - password [email or web address] [facility]  Office  [phone]  CC: Primary care physician; Venia Carbon, MD  Note: This dictation was prepared with Dragon dictation along with smaller phrase technology. Any transcriptional errors that result from this process are unintentional.

## 2019-01-05 NOTE — Progress Notes (Signed)
Pt experienced EKG rhythm change at approx 0540 and went into an irregular ectopic atrial tachycardia/LBBB. Pt non symptomatic. Cardiologist Dr. Aundra Dubin notified. No no order at this time. Will continue to monitor.

## 2019-01-06 ENCOUNTER — Inpatient Hospital Stay: Payer: Medicare Other

## 2019-01-06 LAB — CBC
HCT: 30.2 % — ABNORMAL LOW (ref 36.0–46.0)
Hemoglobin: 9.9 g/dL — ABNORMAL LOW (ref 12.0–15.0)
MCH: 31.2 pg (ref 26.0–34.0)
MCHC: 32.8 g/dL (ref 30.0–36.0)
MCV: 95.3 fL (ref 80.0–100.0)
Platelets: 550 10*3/uL — ABNORMAL HIGH (ref 150–400)
RBC: 3.17 MIL/uL — ABNORMAL LOW (ref 3.87–5.11)
RDW: 15 % (ref 11.5–15.5)
WBC: 9.9 10*3/uL (ref 4.0–10.5)
nRBC: 0 % (ref 0.0–0.2)

## 2019-01-06 LAB — LD, BODY FLUID (OTHER): LD, Body Fluid: 863 IU/L

## 2019-01-06 LAB — PROTEIN, BODY FLUID (OTHER): Total Protein, Body Fluid Other: 4.5 g/dL

## 2019-01-06 LAB — GLUCOSE, BODY FLUID OTHER: Glucose, Body Fluid Other: 72 mg/dL

## 2019-01-06 MED ORDER — ENSURE ENLIVE PO LIQD: 237.0000 mL | Freq: Two times a day (BID) | ORAL | Status: DC

## 2019-01-06 NOTE — Progress Notes (Signed)
Initial Nutrition Assessment  RD working remotely.  DOCUMENTATION CODES:   Not applicable  INTERVENTION:  Consider liberalizing diet in setting of poor PO intake.  Provide Ensure Enlive po BID, each supplement provides 350 kcal and 20 grams of protein.  Continue daily MVI.  NUTRITION DIAGNOSIS:   Inadequate oral intake related to decreased appetite as evidenced by meal completion < 50%.  GOAL:   Patient will meet greater than or equal to 90% of their needs  MONITOR:   PO intake, Supplement acceptance, Labs, Weight trends, I & O's  REASON FOR ASSESSMENT:   Malnutrition Screening Tool    ASSESSMENT:   85 year old female with PMHx of GERD, OA, OP, depression, anxiety, lumbar compression fracture admitted with acute hypoxic respiratory failure in setting of pericardial effusion with early evidence of tamponade physiology s/p pericardiocentesis with drain placement, also with acute on chronic anemia.   Discussed with RN. Patient has been sitting up in chair for 3 hours today and is very tired now and resting. Per chart she is on heart healthy diet and is eating about 20-30% of her meals. Weights in chart appear stable. Weight this admission trending up likely from fluid. Patient documented to have generalized mild edema.  Medications reviewed and include: acidophilus 1 capsule daily, vitamin D3 1000 units daily, Colace 100 mg BID, MVI daily, pantoprazole, psyllium 1 packet daily, NS at 50 mL/hr.  Labs reviewed.  NUTRITION - FOCUSED PHYSICAL EXAM:  Unable to complete at this time.  Diet Order:   Diet Order            Diet Heart Room service appropriate? Yes; Fluid consistency: Thin  Diet effective now             EDUCATION NEEDS:   No education needs have been identified at this time  Skin:  Skin Assessment: Reviewed RN Assessment  Last BM:  01/06/2019 per chart  Height:   Ht Readings from Last 1 Encounters:  01/04/19 5\' 3"  (1.6 m)   Weight:   Wt Readings  from Last 1 Encounters:  01/06/19 78.8 kg   Ideal Body Weight:  52.3 kg  BMI:  Body mass index is 30.77 kg/m.  Estimated Nutritional Needs:   Kcal:  1400-1600  Protein:  70-80 grams  Fluid:  1.4-1.6 L/day  Willey Blade, MS, RD, LDN Office: [phone] Pager: [phone] After Hours/Weekend Pager: [phone]

## 2019-01-06 NOTE — Progress Notes (Signed)
Progress Note   Subjective   Doing well today, the patient denies CP.  SOB is improved.  No drainage overnight from pericardial drain.  No new concerns  Inpatient Medications    Scheduled Meds: . acidophilus  1 capsule Oral Daily  . Chlorhexidine Gluconate Cloth  6 each Topical Daily  . cholecalciferol  1,000 Units Oral Daily  . docusate sodium  100 mg Oral BID  . feeding supplement (ENSURE ENLIVE)  237 mL Oral BID BM  . metoprolol succinate  25 mg Oral Daily  . multivitamin with minerals  1 tablet Oral Daily  . pantoprazole  40 mg Oral Daily  . psyllium  1 packet Oral Daily   Continuous Infusions: . sodium chloride 50 mL/hr at 01/05/19 1800   PRN Meds: acetaminophen **OR** acetaminophen, albuterol, ALPRAZolam, calcium carbonate, HYDROcodone-acetaminophen, nitroGLYCERIN, ondansetron **OR** ondansetron (ZOFRAN) IV, polyethylene glycol   Vital Signs    Vitals:   01/06/19 0600 01/06/19 0700 01/06/19 0800 01/06/19 1000  BP: 123/76 103/85 132/77 122/78  Pulse: (!) 115 (!) 113 (!) 113 (!) 103  Resp: (!) 22 (!) 25 (!) 21 (!) 32  Temp:   97.7 F (36.5 C)   TempSrc:   Oral   SpO2: 94% 93% 93% 92%  Weight:      Height:        Intake/Output Summary (Last 24 hours) at 01/06/2019 1257 Last data filed at 01/06/2019 0944 Gross per 24 hour  Intake 659.38 ml  Output 1011 ml  Net -351.62 ml   Filed Weights   01/04/19 1830 01/05/19 0500 01/06/19 0450  Weight: 74.9 kg 76.3 kg 78.8 kg    Telemetry    Sinus tachycardia - Personally Reviewed  Physical Exam   GEN- The patient is elderly appearing, alert and oriented x 3 today.   Head- normocephalic, atraumatic Eyes-  Sclera clear, conjunctiva pink Ears- hearing intact Oropharynx- clear Neck- supple, Lungs-  normal work of breathing Heart- Regular rate and rhythm  GI- soft, NT, ND, + BS Extremities- no clubbing, cyanosis, or edema    Labs    Chemistry Recent Labs  Lab 01/04/19 1121 01/05/19 0517  NA 129* 135  K  5.1 4.4  CL 95* 101  CO2 22 23  GLUCOSE 121* 141*  BUN 22 19  CREATININE 1.15* 1.17*  CALCIUM 8.1* 8.1*  PROT 6.7  --   ALBUMIN 3.0*  --   AST 26  --   ALT 30  --   ALKPHOS 161*  --   BILITOT 0.6  --   GFRNONAA 40* 39*  GFRAA 47* 46*  ANIONGAP 12 11     Hematology Recent Labs  Lab 01/04/19 1121 01/06/19 0527  WBC 8.2 9.9  RBC 3.07* 3.17*  HGB 9.7* 9.9*  HCT 29.8* 30.2*  MCV 97.1 95.3  MCH 31.6 31.2  MCHC 32.6 32.8  RDW 15.0 15.0  PLT 509* 550*      Patient Profile:   Bridget Mendez is a 85 y.o. female with a hx of PVCs, left bundle branch block, anxiety, depression, lumbar compression fracture, osteoporosis, and GERD who presents with large pericardial effusion/ tamponade.  S/p pericardial drain this admission.   Assessment & Plan    1.  Pericardial effusion Echo reveals small effusion, no tamponade No output from drain x 24 hours.  Will remove drain at this time.  Awaiting results of cytology of pericardial fluid  2. Anemia Stable No change required today  3. Acute renal failure Stable No  change required today  4. Acute hypoxic respiratory failure Improved  Transfer to telemetry today  Thompson Grayer MD, [facility] 01/06/2019 12:57 PM

## 2019-01-06 NOTE — Progress Notes (Addendum)
[place] at [place] NAME: Bridget Mendez    MR#:  [id number]  DATE OF BIRTH:  1920-02-07  SUBJECTIVE:  Feeling less SOB this am Weak   REVIEW OF SYSTEMS:    Review of Systems  Constitutional: Negative for fever, chills weight loss HENT: Negative for ear pain, nosebleeds, congestion, facial swelling, rhinorrhea, neck pain, neck stiffness and ear discharge.   Respiratory: Negative for cough, shortness of breath, wheezing  Cardiovascular: Negative for chest pain, palpitations and leg swelling.  Gastrointestinal: Negative for heartburn, abdominal pain, vomiting, diarrhea or consitpation Genitourinary: Negative for dysuria, urgency, frequency, hematuria Musculoskeletal: Negative for back pain or joint pain Neurological: Negative for dizziness, seizures, syncope, focal weakness,  numbness and headaches.  Hematological: Does not bruise/bleed easily.  Psychiatric/Behavioral: Negative for hallucinations, confusion, dysphoric mood    Tolerating Diet: yes      DRUG ALLERGIES:  No Known Allergies  VITALS:  Blood pressure 122/78, pulse (!) 103, temperature 97.7 F (36.5 C), temperature source Oral, resp. rate (!) 32, height 5\' 3"  (1.6 m), weight 78.8 kg, SpO2 92 %.  PHYSICAL EXAMINATION:  Constitutional: Appears well-developed and well-nourished. No distress. HENT: Normocephalic. Marland Kitchen Oropharynx is clear and moist.  Eyes: Conjunctivae and EOM are normal. PERRLA, no scleral icterus.  Neck: Normal ROM. Neck supple. No JVD. No tracheal deviation. CVS: tachycardic S1/S2 +, no murmurs, no gallops, no carotid bruit.  Has drain placed Pulmonary: Effort and breath sounds normal, no stridor, rhonchi, wheezes, rales.  Abdominal: Soft. BS +,  no distension, tenderness, rebound or guarding.  Musculoskeletal: Normal range of motion. No edema and no tenderness.  Neuro: Alert. CN 2-12 grossly intact. No focal deficits. Skin: Skin is warm and dry. No rash  noted. Psychiatric: Normal mood and affect.      LABORATORY PANEL:   CBC Recent Labs  Lab 01/06/19 0527  WBC 9.9  HGB 9.9*  HCT 30.2*  PLT 550*   ------------------------------------------------------------------------------------------------------------------  Chemistries  Recent Labs  Lab 01/04/19 1121 01/05/19 0517  NA 129* 135  K 5.1 4.4  CL 95* 101  CO2 22 23  GLUCOSE 121* 141*  BUN 22 19  CREATININE 1.15* 1.17*  CALCIUM 8.1* 8.1*  AST 26  --   ALT 30  --   ALKPHOS 161*  --   BILITOT 0.6  --    ------------------------------------------------------------------------------------------------------------------  Cardiac Enzymes No results for input(s): TROPONINI in the last 168 hours. ------------------------------------------------------------------------------------------------------------------  RADIOLOGY:  Ct Angio Chest Pe W And/or Wo Contrast  Result Date: 01/04/2019 CLINICAL DATA:  Shortness of breath, diagnosed with pneumonia last week EXAM: CT ANGIOGRAPHY CHEST WITH CONTRAST TECHNIQUE: Multidetector CT imaging of the chest was performed using the standard protocol during bolus administration of intravenous contrast. Multiplanar CT image reconstructions and MIPs were obtained to evaluate the vascular anatomy. CONTRAST:  59mL OMNIPAQUE IOHEXOL 350 MG/ML SOLN COMPARISON:  12/24/2018 chest x-ray FINDINGS: Cardiovascular: Satisfactory opacification of the pulmonary arteries to the segmental level. No evidence of pulmonary embolism. Normal heart size. Moderate pericardial effusion measuring 2.4 cm in thickness along the left ventricle. Thoracic aortic atherosclerosis. Mediastinum/Nodes: No enlarged mediastinal, hilar, or axillary lymph nodes. Thyroid gland, trachea, and esophagus demonstrate no significant findings. Lungs/Pleura: Small bilateral pleural effusions with bibasilar atelectasis. No focal consolidation to suggest pneumonia. No pneumothorax. Upper Abdomen:  No acute abnormality. Musculoskeletal: No aggressive osseous lesion. Age-indeterminate T7 vertebral body compression fracture. Review of the MIP images confirms the above findings. IMPRESSION: 1. No evidence of pulmonary  embolus. 2. Small bilateral pleural effusions, left greater than right with bibasilar atelectasis. 3. Moderate pericardial effusion measuring 2.4 cm in thickness along the left ventricle. 4. Age-indeterminate T7 vertebral body compression fracture with approximately 70% anterior height loss. 5.  Aortic Atherosclerosis (ICD10-I70.0). Electronically Signed   By: Kathreen Devoid   On: 01/04/2019 13:34   Dg Chest Port 1 View  Result Date: 01/04/2019 CLINICAL DATA:  Status post pericardiocentesis EXAM: PORTABLE CHEST 1 VIEW COMPARISON:  CT PE study dated January 04, 2019 FINDINGS: The heart size appears stable from chest x-ray dated December 24, 2018 but is likely improved when compared to CT dated January 04, 2019. A pericardial drain is now noted projecting over the heart. There is a moderate-sized left-sided pleural effusion. There is a small right-sided pleural effusion. There is atelectasis at the left lung base. There is mild volume overload without overt pulmonary edema. No pneumothorax. A T7 compression fracture is again noted. Aortic calcifications are noted. IMPRESSION: 1. New pericardial drain is noted projecting over the lower heart border. 2. Moderate-sized left-sided pleural effusion. There is a small right-sided pleural effusion. 3. No pneumothorax. Electronically Signed   By: Constance Holster M.D.   On: 01/04/2019 20:19     ASSESSMENT AND PLAN:    85 year old female with a history of osteoporosis who presented to the ER with shortness of breath and found to have pericardial effusion with tamponade physiology.  1.  Acute hypoxic respiratory failure in the setting of pericardial effusion large with early evidence of tamponade physiology which is etiology of patient's shortness of breath  (hemopericardium):  Patient is postoperative day #2 pericardiocentesis with drain placed. ECHo shows minimal pericardial effusion and no tamponade  Plan as per cardiology.   Patient weaned off of oxygen. COVID testing negative. Will order CXR this am and ISS as patient stil with some tachpnea and tachycardia.   2.  Acute on chronic anemia in the setting of hemopericardium with iron studies consistent with low iron and iron saturation. Patient would benefit from IV iron if okay with cardiology. Hgb stable this am   3.  Anxiety: Continue PRN Xanax  4.  History of PVCs: Continue metoprolol  5. Hyponatremia : Sodium level normalized this morning.  Management plans discussed with the patient and she is in agreement.  CODE STATUS: dnr  TOTAL TIME TAKING CARE OF THIS PATIENT: 26 minutes.     POSSIBLE D/C 1-2 days, DEPENDING ON CLINICAL CONDITION.   Bettey Costa M.D on 01/06/2019 at 10:52 AM  Between 7am to 6pm - Pager - [phone] After 6pm go to [email or web address] - password [email or web address] [facility]  Office  [phone]  CC: Primary care physician; Venia Carbon, MD  Note: This dictation was prepared with Dragon dictation along with smaller phrase technology. Any transcriptional errors that result from this process are unintentional.

## 2019-01-06 NOTE — Progress Notes (Addendum)
Good day. Up in chair x 2 hours. Very steady when using walker to ambulate. Patient had 2 large BMs. One in bedside commode and the second one incontinently. Very talkative. Daughter called and asked several questions. Daughter did not seem to understand that her mother was almost 68 and her body was wearing out.Family lives in [place]. It appears family has unrealistic expectations for their mothers recovery. Pericardial drain site remains clean and dry. IS teaching done. Patient able to pull 1250 on IS.

## 2019-01-06 NOTE — Progress Notes (Signed)
PT Cancellation Note  Patient Details Name: Bridget Mendez MRN: [id number] DOB: February 08, 1920   Cancelled Treatment:    Reason Eval/Treat Not Completed: Fatigue/lethargy limiting ability to participate(Consult received and chart reviewed.  Per primary RN, patient has been up in room with RW, OOB in chair this AM x3 hours and tolerated well. Now fatigued and resting soundly in bed.  RN requests hold at this time with re-attempt next date.  Will continue efforts as medically appropriate.)   Shyvonne Chastang H. Owens Shark, PT, DPT, NCS 01/06/19, 11:34 AM [phone]

## 2019-01-06 NOTE — Progress Notes (Signed)
Late note 1330 Pericardial drain pulled per Dr.Allred. Patient tolerated procedure well. Site dressed with 2x2 sterile gauze and tegaderm.

## 2019-01-07 ENCOUNTER — Encounter: Payer: Self-pay | Admitting: Internal Medicine

## 2019-01-07 ENCOUNTER — Inpatient Hospital Stay (HOSPITAL_COMMUNITY)
Admit: 2019-01-07 | Discharge: 2019-01-07 | Disposition: A | Discharge status: Home / Self Care | Payer: Medicare Other | Attending: Physician Assistant | Admitting: Physician Assistant

## 2019-01-07 DIAGNOSIS — I361 Nonrheumatic tricuspid (valve) insufficiency: Secondary | ICD-10-CM

## 2019-01-07 LAB — BASIC METABOLIC PANEL
Anion gap: 10 (ref 5–15)
BUN: 16 mg/dL (ref 8–23)
CO2: 22 mmol/L (ref 22–32)
Calcium: 7.7 mg/dL — ABNORMAL LOW (ref 8.9–10.3)
Chloride: 103 mmol/L (ref 98–111)
Creatinine, Ser: 0.93 mg/dL (ref 0.44–1.00)
GFR calc Af Amer: >60 mL/min (ref >60)
GFR calc non Af Amer: 52 mL/min — ABNORMAL LOW (ref >60)
Glucose, Bld: 114 mg/dL — ABNORMAL HIGH (ref 70–99)
Potassium: 4.1 mmol/L (ref 3.5–5.1)
Sodium: 135 mmol/L (ref 135–145)

## 2019-01-07 LAB — ECHOCARDIOGRAM LIMITED
Height: 63 in
Weight: 2595.2 oz

## 2019-01-07 LAB — CBC
HCT: 30.4 % — ABNORMAL LOW (ref 36.0–46.0)
Hemoglobin: 9.8 g/dL — ABNORMAL LOW (ref 12.0–15.0)
MCH: 31.2 pg (ref 26.0–34.0)
MCHC: 32.2 g/dL (ref 30.0–36.0)
MCV: 96.8 fL (ref 80.0–100.0)
Platelets: 520 10*3/uL — ABNORMAL HIGH (ref 150–400)
RBC: 3.14 MIL/uL — ABNORMAL LOW (ref 3.87–5.11)
RDW: 15 % (ref 11.5–15.5)
WBC: 9.5 10*3/uL (ref 4.0–10.5)
nRBC: 0 % (ref 0.0–0.2)

## 2019-01-07 MED ORDER — PSYLLIUM 95 % PO PACK
1.0000 | PACK | Freq: Every day | ORAL | Status: DC | PRN
  Filled 2019-01-07: qty 1

## 2019-01-07 MED ORDER — DOCUSATE SODIUM 100 MG PO CAPS: 100.0000 mg | ORAL_CAPSULE | Freq: Every day | ORAL | Status: DC | PRN

## 2019-01-07 NOTE — Progress Notes (Signed)
[place] at [place] NAME: Bridget Mendez    MR#:  [id number]  DATE OF BIRTH:  December 28, 1919  SUBJECTIVE:   Continues to have shortness of breath.  Ambulated to the bathroom and back and has significant tachypnea. No chest pain  Pericardial drain removed on 01/06/2019  REVIEW OF SYSTEMS:    Review of Systems  Constitutional: Negative for fever, chills weight loss HENT: Negative for ear pain, nosebleeds, congestion, facial swelling, rhinorrhea, neck pain, neck stiffness and ear discharge.   Respiratory: Positive for shortness of breath Cardiovascular: Negative for chest pain, palpitations and leg swelling.  Gastrointestinal: Negative for heartburn, abdominal pain, vomiting, diarrhea or consitpation Genitourinary: Negative for dysuria, urgency, frequency, hematuria Musculoskeletal: Negative for back pain or joint pain Neurological: Negative for dizziness, seizures, syncope, focal weakness,  numbness and headaches.  Hematological: Does not bruise/bleed easily.  Psychiatric/Behavioral: Negative for hallucinations, confusion, dysphoric mood  Tolerating Diet: yes  DRUG ALLERGIES:  No Known Allergies  VITALS:  Blood pressure 128/72, pulse (!) 103, temperature 98 F (36.7 C), temperature source Oral, resp. rate (!) 23, height 5\' 3"  (1.6 m), weight 80.4 kg, SpO2 (!) 84 %.  PHYSICAL EXAMINATION:  Constitutional: Appears well-developed and well-nourished. No distress. HENT: Normocephalic. Marland Kitchen Oropharynx is clear and moist.  Eyes: Conjunctivae and EOM are normal. PERRLA, no scleral icterus.  Neck: Normal ROM. Neck supple. No JVD. No tracheal deviation. CVS: tachycardic S1/S2 +, no murmurs, no gallops, no carotid bruit.  Has drain placed Pulmonary: Effort and breath sounds normal, no stridor, rhonchi, wheezes, rales.  Abdominal: Soft. BS +,  no distension, tenderness, rebound or guarding.  Musculoskeletal: Normal range of motion. No edema and no  tenderness.  Neuro: Alert. CN 2-12 grossly intact. No focal deficits. Skin: Skin is warm and dry. No rash noted. Psychiatric: Normal mood and affect.   LABORATORY PANEL:   CBC Recent Labs  Lab 01/07/19 0402  WBC 9.5  HGB 9.8*  HCT 30.4*  PLT 520*   ------------------------------------------------------------------------------------------------------------------  Chemistries  Recent Labs  Lab 01/04/19 1121  01/07/19 0402  NA 129*   < > 135  K 5.1   < > 4.1  CL 95*   < > 103  CO2 22   < > 22  GLUCOSE 121*   < > 114*  BUN 22   < > 16  CREATININE 1.15*   < > 0.93  CALCIUM 8.1*   < > 7.7*  AST 26  --   --   ALT 30  --   --   ALKPHOS 161*  --   --   BILITOT 0.6  --   --    < > = values in this interval not displayed.   ------------------------------------------------------------------------------------------------------------------  Cardiac Enzymes No results for input(s): TROPONINI in the last 168 hours. ------------------------------------------------------------------------------------------------------------------  RADIOLOGY:  Dg Chest 1 View  Result Date: 01/06/2019 CLINICAL DATA:  Shortness of breath and weakness this morning. EXAM: CHEST  1 VIEW COMPARISON:  Chest x-rays dated 01/04/2019, 12/24/2018 and 06/01/2017. FINDINGS: Stable cardiomegaly. Moderate-sized LEFT pleural effusion appears stable. Pericardial drain remains in place with tip at the midline. Small stable RIGHT-sided pleural effusion. There is mild central pulmonary vascular congestion without evidence of overt alveolar pulmonary edema, likely superimposed on some degree of chronic bronchitic change and/or chronic interstitial lung disease. IMPRESSION: 1. Cardiomegaly with central pulmonary vascular congestion suggesting mild CHF/volume overload, likely superimposed on chronic bronchitic change and/or chronic interstitial lung disease. 2. Stable bilateral  pleural effusions, moderate on the LEFT and small  on the RIGHT. 3. Pericardial drain remains in place with tip at the midline. Electronically Signed   By: Franki Cabot M.D.   On: 01/06/2019 11:27   ASSESSMENT AND PLAN:   85 year old female with a history of osteoporosis who presented to the ER with shortness of breath and found to have pericardial effusion with tamponade physiology.  1.  Acute hypoxic respiratory failure in the setting of pericardial effusion large with early evidence of tamponade physiology(hemopericardium):  Pericardiocentesis on 01/04/2019.  Drain removed on 01/06/2019. Repeat echo ordered for today. Cytology pending from pericardial fluid Patient weaned off of oxygen. COVID testing negative.  2.  Acute on chronic anemia in the setting of hemopericardium Hgb stable this am   3.  Anxiety: Continue PRN Xanax  4.  History of PVCs: Continue metoprolol  5. Hyponatremia : resolved  Management plans discussed with the patient and she is in agreement.  CODE STATUS: DNR  TOTAL TIME TAKING CARE OF THIS PATIENT: 35 minutes.   POSSIBLE D/C 1-2 days, DEPENDING ON CLINICAL CONDITION.  Leia Alf Araseli Sherry M.D on 01/07/2019 at 2:11 PM  Between 7am to 6pm - Pager - [phone]  After 6pm go to [email or web address] - password [email or web address] [facility]  Office  [phone]  CC: Primary care physician; Venia Carbon, MD  Note: This dictation was prepared with Dragon dictation along with smaller phrase technology. Any transcriptional errors that result from this process are unintentional.

## 2019-01-07 NOTE — Progress Notes (Signed)
Spoke to daughter on phone and answered questions. She is requesting the cardiologist calls her for an update after the echo is read. Notified Christell Faith, PA and he will pass this along.   Patient resting quietly this shift. No complaints. Will continue to monitor.

## 2019-01-07 NOTE — Progress Notes (Signed)
Subjective:    Patient ID: Bridget Mendez, female    DOB: October 14, 1919, 85 y.o.   MRN: [id number]  HPI  Asked to recheck resident in [address] Recent ER visit for pneumonia. On Levaquin. She was seen 6/24 for the same, added Albuterol nebs and Lasix with no real improvement Still weak, fatigued and short of breat at rest No fevers.  Review of Systems  Past Medical History:  Diagnosis Date  . Allergy   . Anxiety   . Dental bridge present    permanent, bottom  . Depression   . GERD (gastroesophageal reflux disease)   . Lumbar compression fracture ([place]) 04/2018  . Osteoarthritis   . Osteoporosis   . PVC (premature ventricular contraction)     No current facility-administered medications for this visit.    No current outpatient medications on file.   Facility-Administered Medications Ordered in Other Visits  Medication Dose Route Frequency Provider Last Rate Last Dose  . acetaminophen (TYLENOL) tablet 650 mg  650 mg Oral Q6H PRN Salary, Montell D, MD       Or  . acetaminophen (TYLENOL) suppository 650 mg  650 mg Rectal Q6H PRN Salary, Montell D, MD      . acidophilus (RISAQUAD) capsule 1 capsule  1 capsule Oral Daily Salary, Montell D, MD   1 capsule at 01/06/19 0911  . albuterol (PROVENTIL) (2.5 MG/3ML) 0.083% nebulizer solution 2.5 mg  2.5 mg Nebulization Q6H PRN Salary, Montell D, MD      . ALPRAZolam [name]) tablet 0.25-0.5 mg  0.25-0.5 mg Oral BID PRN Salary, Holly Bodily D, MD   0.25 mg at 01/07/19 0813  . calcium carbonate (TUMS - dosed in mg elemental calcium) chewable tablet 600 mg of elemental calcium  3 tablet Oral Daily PRN Salary, Montell D, MD      . cholecalciferol (VITAMIN D3) tablet 1,000 Units  1,000 Units Oral Daily Salary, Montell D, MD   1,000 Units at 01/06/19 0906  . docusate sodium (COLACE) capsule 100 mg  100 mg Oral Daily PRN Ottie Glazier, MD      . feeding supplement (ENSURE ENLIVE) (ENSURE ENLIVE) liquid 237 mL  237 mL Oral BID BM Mody, Sital, MD       . HYDROcodone-acetaminophen (NORCO/VICODIN) 5-325 MG per tablet 1 tablet  1 tablet Oral Q6H PRN Salary, Montell D, MD      . metoprolol succinate (TOPROL-XL) 24 hr tablet 25 mg  25 mg Oral Daily Salary, Montell D, MD      . multivitamin with minerals tablet 1 tablet  1 tablet Oral Daily Salary, Montell D, MD   1 tablet at 01/06/19 0907  . nitroGLYCERIN (NITROSTAT) SL tablet 0.4 mg  0.4 mg Sublingual Q5 min PRN Salary, Montell D, MD      . ondansetron (ZOFRAN) tablet 4 mg  4 mg Oral Q6H PRN Salary, Montell D, MD       Or  . ondansetron (ZOFRAN) injection 4 mg  4 mg Intravenous Q6H PRN Salary, Montell D, MD      . pantoprazole (PROTONIX) EC tablet 40 mg  40 mg Oral Daily Salary, Montell D, MD   40 mg at 01/06/19 0905  . polyethylene glycol (MIRALAX / GLYCOLAX) packet 17 g  17 g Oral Daily PRN Salary, Montell D, MD      . psyllium (HYDROCIL/METAMUCIL) packet 1 packet  1 packet Oral Daily PRN Ottie Glazier, MD        No Known Allergies  Family History  Problem Relation Age of Onset  . Parkinsonism Mother   . Cancer Maternal Grandmother     Social History   Socioeconomic History  . Marital status: Widowed    Spouse name: Not on file  . Number of children: 2  . Years of education: Not on file  . Highest education level: Not on file  Occupational History  . Occupation: retired  Scientific laboratory technician  . Financial resource strain: Not on file  . Food insecurity    Worry: Not on file    Inability: Not on file  . Transportation needs    Medical: Not on file    Non-medical: Not on file  Tobacco Use  . Smoking status: Former Smoker    Packs/day: 1.00    Types: Cigarettes    Quit date: 07/11/1988    Years since quitting: 30.5  . Smokeless tobacco: Never Used  Substance and Sexual Activity  . Alcohol use: Not Currently  . Drug use: No  . Sexual activity: Not on file  Lifestyle  . Physical activity    Days per week: Not on file    Minutes per session: Not on file  . Stress: Not on file   Relationships  . Social Herbalist on phone: Not on file    Gets together: Not on file    Attends religious service: Not on file    Active member of club or organization: Not on file    Attends meetings of clubs or organizations: Not on file    Relationship status: Not on file  . Intimate partner violence    Fear of current or ex partner: Not on file    Emotionally abused: Not on file    Physically abused: Not on file    Forced sexual activity: Not on file  Other Topics Concern  . Not on file  Social History Narrative   Has living will   Daughter, [name], is health care POA--- son [name] is alternate   Has DNR already--reviewed   No tube feeds if cognitively aware        Constitutional: Pt reports fatigue. Denies fever, malaise, headache or abrupt weight changes.  HEENT: Denies eye pain, eye redness, ear pain, ringing in the ears, wax buildup, runny nose, nasal congestion, bloody nose, or sore throat. Respiratory: Pt reports shortness of breath. Denies difficulty breathing, cough or sputum production.   Cardiovascular: Denies chest pain, chest tightness, palpitations or swelling in the hands or feet.  Musculoskeletal: Pt reports weakness. Denies decrease in range of motion, difficulty with gait, muscle pain or joint pain and swelling.  Skin: Denies redness, rashes, lesions or ulcercations.  Neurological: Denies dizziness, difficulty with memory, difficulty with speech or problems with balance and coordination.    No other specific complaints in a complete review of systems (except as listed in HPI above).     Objective:   Physical Exam  BP (!) 100/58   Pulse (!) 101   Temp 97.9 F (36.6 C)   Resp (!) 22   SpO2 93%  Wt Readings from Last 3 Encounters:  01/07/19 177 lb 4 oz (80.4 kg)  01/07/19 162 lb 3.2 oz (73.6 kg)  12/27/18 162 lb 3.2 oz (73.6 kg)    General: Appears her stated age. Skin: Warm, dry and intact. No rashes noted. HEENT: Head: normal shape  and size; Throat/Mouth: Teeth present, mucosa pink and moist, no exudate, lesions or ulcerations noted.  Neck:  Neo adenopathy noted.  Cardiovascular:  Tachycardic with normal rhythm. Distant S1,S2 noted. No BLE edema.  Pulmonary/Chest: Normal effort and positive vesicular breath sounds. Mild respiratory distress. No wheezes, rales or ronchi noted.  Neurological: Alert and oriented.    BMET    Component Value Date/Time   NA 135 01/07/2019 0402   K 4.1 01/07/2019 0402   CL 103 01/07/2019 0402   CO2 22 01/07/2019 0402   GLUCOSE 114 (H) 01/07/2019 0402   BUN 16 01/07/2019 0402   CREATININE 0.93 01/07/2019 0402   CALCIUM 7.7 (L) 01/07/2019 0402   GFRNONAA 52 (L) 01/07/2019 0402   GFRAA >60 01/07/2019 0402    Lipid Panel  No results found for: CHOL, TRIG, HDL, CHOLHDL, VLDL, LDLCALC  CBC    Component Value Date/Time   WBC 9.5 01/07/2019 0402   RBC 3.14 (L) 01/07/2019 0402   HGB 9.8 (L) 01/07/2019 0402   HCT 30.4 (L) 01/07/2019 0402   PLT 520 (H) 01/07/2019 0402   MCV 96.8 01/07/2019 0402   MCH 31.2 01/07/2019 0402   MCHC 32.2 01/07/2019 0402   RDW 15.0 01/07/2019 0402   LYMPHSABS 2.1 12/26/2016 1127   MONOABS 1.0 12/26/2016 1127   EOSABS 0.1 12/26/2016 1127   BASOSABS 0.0 12/26/2016 1127    Hgb A1C No results found for: HGBA1C          Assessment & Plan:   Shortness of Breath:  Lungs sound fine, I don't think this is related to pneumonia Would recommend ER evaluation for further workup Discussed with Luellen Pucker, RN and Dr. Silvio Pate PCP and both agree  Will reassess as needed Webb Silversmith, NP

## 2019-01-07 NOTE — Progress Notes (Signed)
*  PRELIMINARY RESULTS* Echocardiogram 2D Echocardiogram has been performed.  Sherrie Sport 01/07/2019, 10:38 AM

## 2019-01-07 NOTE — Patient Instructions (Signed)
Community-Acquired Pneumonia, Adult °Pneumonia is an infection of the lungs. It causes swelling in the airways of the lungs. Mucus and fluid may also build up inside the airways. °One type of pneumonia can happen while a person is in a hospital. A different type can happen when a person is not in a hospital (community-acquired pneumonia).  °What are the causes? ° °This condition is caused by germs (viruses, bacteria, or fungi). Some types of germs can be passed from one person to another. This can happen when you breathe in droplets from the cough or sneeze of an infected person. °What increases the risk? °You are more likely to develop this condition if you: °· Have a long-term (chronic) disease, such as: °? Chronic obstructive pulmonary disease (COPD). °? Asthma. °? Cystic fibrosis. °? Congestive heart failure. °? Diabetes. °? Kidney disease. °· Have HIV. °· Have sickle cell disease. °· Have had your spleen removed. °· Do not take good care of your teeth and mouth (poor dental hygiene). °· Have a medical condition that increases the risk of breathing in droplets from your own mouth and nose. °· Have a weakened body defense system (immune system). °· Are a smoker. °· Travel to areas where the germs that cause this illness are common. °· Are around certain animals or the places they live. °What are the signs or symptoms? °· A dry cough. °· A wet (productive) cough. °· Fever. °· Sweating. °· Chest pain. This often happens when breathing deeply or coughing. °· Fast breathing or trouble breathing. °· Shortness of breath. °· Shaking chills. °· Feeling tired (fatigue). °· Muscle aches. °How is this treated? °Treatment for this condition depends on many things. Most adults can be treated at home. In some cases, treatment must happen in a hospital. Treatment may include: °· Medicines given by mouth or through an IV tube. °· Being given extra oxygen. °· Respiratory therapy. °In rare cases, treatment for very bad pneumonia  may include: °· Using a machine to help you breathe. °· Having a procedure to remove fluid from around your lungs. °Follow these instructions at home: °Medicines °· Take over-the-counter and prescription medicines only as told by your doctor. °? Only take cough medicine if you are losing sleep. °· If you were prescribed an antibiotic medicine, take it as told by your doctor. Do not stop taking the antibiotic even if you start to feel better. °General instructions ° °· Sleep with your head and neck raised (elevated). You can do this by sleeping in a recliner or by putting a few pillows under your head. °· Rest as needed. Get at least 8 hours of sleep each night. °· Drink enough water to keep your pee (urine) pale yellow. °· Eat a healthy diet that includes plenty of vegetables, fruits, whole grains, low-fat dairy products, and lean protein. °· Do not use any products that contain nicotine or tobacco. These include cigarettes, e-cigarettes, and chewing tobacco. If you need help quitting, ask your doctor. °· Keep all follow-up visits as told by your doctor. This is important. °How is this prevented? °A shot (vaccine) can help prevent pneumonia. Shots are often suggested for: °· People older than 83 years of age. °· People older than 83 years of age who: °? Are having cancer treatment. °? Have long-term (chronic) lung disease. °? Have problems with their body's defense system. °You may also prevent pneumonia if you take these actions: °· Get the flu (influenza) shot every year. °· Go to the dentist as   often as told. °· Wash your hands often. If you cannot use soap and water, use hand sanitizer. °Contact a doctor if: °· You have a fever. °· You lose sleep because your cough medicine does not help. °Get help right away if: °· You are short of breath and it gets worse. °· You have more chest pain. °· Your sickness gets worse. This is very serious if: °? You are an older adult. °? Your body's defense system is weak. °· You  cough up blood. °Summary °· Pneumonia is an infection of the lungs. °· Most adults can be treated at home. Some will need treatment in a hospital. °· Drink enough water to keep your pee pale yellow. °· Get at least 8 hours of sleep each night. °This information is not intended to replace advice given to you by your health care provider. Make sure you discuss any questions you have with your health care provider. °Document Released: 12/14/2007 Document Revised: 10/17/2018 Document Reviewed: 02/22/2018 °Elsevier Patient Education © 2020 [employer] ° °

## 2019-01-07 NOTE — Progress Notes (Signed)
Subjective:    Patient ID: Bridget Mendez, female    DOB: [birth year], 85 y.o.   MRN: [id number]  HPI  Resident seen in [address] for ED follow up. She went to the ER 6/15 for chest discomfort. She felt this while taking a shower, felt a pop in her chest when she reached for something. Chest xray consistent with pneumonia. She was negative for COVID. Labs were unremarkable. She was discharged on Levaquin.  Since discharge, she reports she is feeling weak, and has persistent shortness of breath. She has not had any falls. She is short of breath at rest, worst with exertion. She denies runny nose, nasal congestion, ear pain, sore throat or cough. She denies fever, chills or body aches. She has been taking Levaquin as prescribed with minimal relief.  Review of Systems      Past Medical History:  Diagnosis Date  . Allergy   . Anxiety   . Dental bridge present    permanent, bottom  . Depression   . GERD (gastroesophageal reflux disease)   . Lumbar compression fracture ([place]) 04/2018  . Osteoarthritis   . Osteoporosis   . PVC (premature ventricular contraction)     No current facility-administered medications for this visit.    No current outpatient medications on file.   Facility-Administered Medications Ordered in Other Visits  Medication Dose Route Frequency Provider Last Rate Last Dose  . 0.9 %  sodium chloride infusion   Intravenous Continuous Salary, Montell D, MD 50 mL/hr at 01/05/19 1800    . acetaminophen (TYLENOL) tablet 650 mg  650 mg Oral Q6H PRN Salary, Montell D, MD       Or  . acetaminophen (TYLENOL) suppository 650 mg  650 mg Rectal Q6H PRN Salary, Montell D, MD      . acidophilus (RISAQUAD) capsule 1 capsule  1 capsule Oral Daily Salary, Montell D, MD   1 capsule at 01/06/19 0911  . albuterol (PROVENTIL) (2.5 MG/3ML) 0.083% nebulizer solution 2.5 mg  2.5 mg Nebulization Q6H PRN Salary, Montell D, MD      . ALPRAZolam [name]) tablet 0.25-0.5 mg  0.25-0.5 mg Oral  BID PRN Salary, Holly Bodily D, MD   0.25 mg at 01/07/19 0813  . calcium carbonate (TUMS - dosed in mg elemental calcium) chewable tablet 600 mg of elemental calcium  3 tablet Oral Daily PRN Salary, Montell D, MD      . Chlorhexidine Gluconate Cloth 2 % PADS 6 each  6 each Topical Daily End, Christopher, MD   6 each at 01/06/19 0908  . cholecalciferol (VITAMIN D3) tablet 1,000 Units  1,000 Units Oral Daily Loney Hering D, MD   1,000 Units at 01/06/19 0906  . docusate sodium (COLACE) capsule 100 mg  100 mg Oral BID Loney Hering D, MD   100 mg at 01/05/19 0935  . feeding supplement (ENSURE ENLIVE) (ENSURE ENLIVE) liquid 237 mL  237 mL Oral BID BM Mody, Sital, MD      . HYDROcodone-acetaminophen (NORCO/VICODIN) 5-325 MG per tablet 1 tablet  1 tablet Oral Q6H PRN Salary, Montell D, MD      . metoprolol succinate (TOPROL-XL) 24 hr tablet 25 mg  25 mg Oral Daily Salary, Montell D, MD      . multivitamin with minerals tablet 1 tablet  1 tablet Oral Daily Salary, Montell D, MD   1 tablet at 01/06/19 0907  . nitroGLYCERIN (NITROSTAT) SL tablet 0.4 mg  0.4 mg Sublingual Q5 min PRN Salary, Montell  D, MD      . ondansetron (ZOFRAN) tablet 4 mg  4 mg Oral Q6H PRN Salary, Montell D, MD       Or  . ondansetron (ZOFRAN) injection 4 mg  4 mg Intravenous Q6H PRN Salary, Montell D, MD      . pantoprazole (PROTONIX) EC tablet 40 mg  40 mg Oral Daily Salary, Montell D, MD   40 mg at 01/06/19 0905  . polyethylene glycol (MIRALAX / GLYCOLAX) packet 17 g  17 g Oral Daily PRN Salary, Montell D, MD      . psyllium (HYDROCIL/METAMUCIL) packet 1 packet  1 packet Oral Daily Salary, Montell D, MD        No Known Allergies  Family History  Problem Relation Age of Onset  . Parkinsonism Mother   . Cancer Maternal Grandmother     Social History   Socioeconomic History  . Marital status: Widowed    Spouse name: Not on file  . Number of children: 2  . Years of education: Not on file  . Highest education level: Not on  file  Occupational History  . Occupation: retired  Scientific laboratory technician  . Financial resource strain: Not on file  . Food insecurity    Worry: Not on file    Inability: Not on file  . Transportation needs    Medical: Not on file    Non-medical: Not on file  Tobacco Use  . Smoking status: Former Smoker    Packs/day: 1.00    Types: Cigarettes    Quit date: 07/11/1988    Years since quitting: 30.5  . Smokeless tobacco: Never Used  Substance and Sexual Activity  . Alcohol use: Not Currently  . Drug use: No  . Sexual activity: Not on file  Lifestyle  . Physical activity    Days per week: Not on file    Minutes per session: Not on file  . Stress: Not on file  Relationships  . Social Herbalist on phone: Not on file    Gets together: Not on file    Attends religious service: Not on file    Active member of club or organization: Not on file    Attends meetings of clubs or organizations: Not on file    Relationship status: Not on file  . Intimate partner violence    Fear of current or ex partner: Not on file    Emotionally abused: Not on file    Physically abused: Not on file    Forced sexual activity: Not on file  Other Topics Concern  . Not on file  Social History Narrative   Has living will   Daughter, [name], is health care POA--- son [name] is alternate   Has DNR already--reviewed   No tube feeds if cognitively aware        Constitutional: Pt reports fatigue. Denies fever, malaise,  headache or abrupt weight changes.  HEENT: Denies eye pain, eye redness, ear pain, ringing in the ears, wax buildup, runny nose, nasal congestion, bloody nose, or sore throat. Respiratory: Pt reports shortness of breath. Denies difficulty breathing, cough or sputum production.   Cardiovascular: Denies chest pain, chest tightness, palpitations or swelling in the hands or feet.  Gastrointestinal: Denies abdominal pain, bloating, constipation, diarrhea or blood in the stool.  GU: Denies  urgency, frequency, pain with urination, burning sensation, blood in urine, odor or discharge. Musculoskeletal: Pt reports generalized weakness. Denies decrease in range of motion, difficulty with  gait, muscle pain or joint pain and swelling.  Skin: Denies redness, rashes, lesions or ulcercations.  Neurological: Denies dizziness, difficulty with memory, difficulty with speech or problems with balance and coordination.  Psych: Denies anxiety, depression, SI/HI.  No other specific complaints in a complete review of systems (except as listed in HPI above).  Objective:   Physical Exam   BP 108/73   Pulse (!) 112   Temp (!) 96.6 F (35.9 C)   Resp 17   Wt 162 lb 3.2 oz (73.6 kg)   SpO2 (!) 84%   BMI 28.73 kg/m  Wt Readings from Last 3 Encounters:  01/07/19 177 lb 4 oz (80.4 kg)  01/07/19 162 lb 3.2 oz (73.6 kg)  12/27/18 162 lb 3.2 oz (73.6 kg)    General: Appears her stated age, weak and pale. HEENT: Head: normal shape and size; Eyes: sclera white, no icterus, conjunctiva pink, PERRLA and EOMs intact;  Throat/Mouth: Teeth present, mucosa pink and moist, no exudate, lesions or ulcerations noted.  Neck:  No cervical adenopathy noted. Cardiovascular: Normal rate and rhythm. Distant S1,S2 noted. Trace BLE edema.  Pulmonary/Chest: Normal effort and positive vesicular breath sounds. No respiratory distress. Wheezing noted throughout.  Neurological: Alert and oriented.    BMET    Component Value Date/Time   NA 135 01/07/2019 0402   K 4.1 01/07/2019 0402   CL 103 01/07/2019 0402   CO2 22 01/07/2019 0402   GLUCOSE 114 (H) 01/07/2019 0402   BUN 16 01/07/2019 0402   CREATININE 0.93 01/07/2019 0402   CALCIUM 7.7 (L) 01/07/2019 0402   GFRNONAA 52 (L) 01/07/2019 0402   GFRAA >60 01/07/2019 0402    Lipid Panel  No results found for: CHOL, TRIG, HDL, CHOLHDL, VLDL, LDLCALC  CBC    Component Value Date/Time   WBC 9.5 01/07/2019 0402   RBC 3.14 (L) 01/07/2019 0402   HGB 9.8 (L)  01/07/2019 0402   HCT 30.4 (L) 01/07/2019 0402   PLT 520 (H) 01/07/2019 0402   MCV 96.8 01/07/2019 0402   MCH 31.2 01/07/2019 0402   MCHC 32.2 01/07/2019 0402   RDW 15.0 01/07/2019 0402   LYMPHSABS 2.1 12/26/2016 1127   MONOABS 1.0 12/26/2016 1127   EOSABS 0.1 12/26/2016 1127   BASOSABS 0.0 12/26/2016 1127    Hgb A1C No results found for: HGBA1C         Assessment & Plan:   ER Follow Up for Pneumonia:  Continue Levaquin Will start Albuterol nebs Q4 hours Will give Lasix 20 mg BID x 5 days as there was a small pleural effusion on chest xray Oxygen prn for sats < 88  Will monitor closely Webb Silversmith, NP

## 2019-01-07 NOTE — TOC Initial Note (Signed)
Transition of Care [facility]) - Initial/Assessment Note    Patient Details  Name: Bridget Mendez MRN: [id number] Date of Birth: [birth year]  [facility]) CM/SW Contact:    Marshell Garfinkel, RN Phone Number: 01/07/2019, 9:21 AM  Clinical Narrative:                  Patient is from ALF at [facility]. Seth Bake with [facility] said that report number to ALF is [phone]. FL2 started. C-19 results negative 01/04/19.        Patient Goals and CMS Choice        Expected Discharge Plan and Services                                                Prior Living Arrangements/Services                       Activities of Daily Living Home Assistive Devices/Equipment: Grab bars around toilet, Grab bars in shower, Hearing aid ADL Screening (condition at time of admission) Patient's cognitive ability adequate to safely complete daily activities?: Yes Is the patient deaf or have difficulty hearing?: Yes Does the patient have difficulty seeing, even when wearing glasses/contacts?: No Does the patient have difficulty concentrating, remembering, or making decisions?: No Patient able to express need for assistance with ADLs?: Yes Does the patient have difficulty dressing or bathing?: No Independently performs ADLs?: Yes (appropriate for developmental age) Does the patient have difficulty walking or climbing stairs?: No Weakness of Legs: Both Weakness of Arms/Hands: None  Permission Sought/Granted                  Emotional Assessment              Admission diagnosis:  Pericardial effusion [I31.3] Dyspnea, unspecified type [R06.00] Patient Active Problem List   Diagnosis Date Noted  . Respiratory failure ([place]) 01/04/2019  . Pericardial effusion   . Lobar pneumonia ([place]) 12/27/2018  . Chronic narcotic dependence ([place]) 12/27/2018  . SVT (supraventricular tachycardia) ([place]) 07/06/2018  . Episodic mood disorder ([place]) 04/28/2010  . ALLERGIC RHINITIS  09/15/2008  . GERD 10/24/2006  . Primary osteoarthritis involving multiple joints 10/24/2006  . Osteoporosis 10/24/2006   PCP:  Venia Carbon, MD Pharmacy:   [facility] [place], [place] [address] [place] [address] Phone: [phone] Fax: [phone]  [id number] [place], [place] [address] [place] [address] Phone: [phone] Fax: [place], [place] [address] [place] [address] Phone: [phone] Fax: [phone]     Social Determinants of Health (SDOH) Interventions    Readmission Risk Interventions No flowsheet data found.

## 2019-01-07 NOTE — Progress Notes (Signed)
Patient transferred from ICU. Resting quietly. No complaints at this time. Assessment as charted. Will continue to monitor.

## 2019-01-07 NOTE — Progress Notes (Signed)
Progress Note  Patient Name: Bridget Mendez Date of Encounter: 01/07/2019  Primary Cardiologist: Rockey Situ  Subjective   Transferred to 2A this morning. SOB following this transfer and recent ambulation to the rest room. No chest pain. HGB remains low, though stable.   Inpatient Medications    Scheduled Meds: . acidophilus  1 capsule Oral Daily  . cholecalciferol  1,000 Units Oral Daily  . feeding supplement (ENSURE ENLIVE)  237 mL Oral BID BM  . metoprolol succinate  25 mg Oral Daily  . multivitamin with minerals  1 tablet Oral Daily  . pantoprazole  40 mg Oral Daily   Continuous Infusions:  PRN Meds: acetaminophen **OR** acetaminophen, albuterol, ALPRAZolam, calcium carbonate, docusate sodium, HYDROcodone-acetaminophen, nitroGLYCERIN, ondansetron **OR** ondansetron (ZOFRAN) IV, polyethylene glycol, psyllium   Vital Signs    Vitals:   01/06/19 2230 01/07/19 0500 01/07/19 0900 01/07/19 0918  BP:    140/90  Pulse: (!) 112  (!) 107 (!) 107  Resp: (!) 36  (!) 30 (!) 23  Temp:    98 F (36.7 C)  TempSrc:    Oral  SpO2: (!) 86%  94% 94%  Weight:  80.4 kg    Height:        Intake/Output Summary (Last 24 hours) at 01/07/2019 1761 Last data filed at 01/07/2019 0800 Gross per 24 hour  Intake 480 ml  Output 500 ml  Net -20 ml   Filed Weights   01/05/19 0500 01/06/19 0450 01/07/19 0500  Weight: 76.3 kg 78.8 kg 80.4 kg    Telemetry    Sinus tachycardia, low 100s to 120s bpm, rare PVC - Personally Reviewed  ECG    n/a - Personally Reviewed  Physical Exam   GEN: Elderly and frail appearing; No acute distress.   Neck: No JVD. Cardiac: Tachycardic, no murmurs, rubs, or gallops.  Respiratory: Slightly diminished breath sounds bilaterally. Speaking in 4-5 word phrases.  GI: Soft, nontender, non-distended.   MS: No edema; No deformity. Neuro:  Alert and oriented x 3; Nonfocal.  Psych: Normal affect.  Labs    Chemistry Recent Labs  Lab 01/04/19 1121  01/05/19 0517 01/07/19 0402  NA 129* 135 135  K 5.1 4.4 4.1  CL 95* 101 103  CO2 22 23 22   GLUCOSE 121* 141* 114*  BUN 22 19 16   CREATININE 1.15* 1.17* 0.93  CALCIUM 8.1* 8.1* 7.7*  PROT 6.7  --   --   ALBUMIN 3.0*  --   --   AST 26  --   --   ALT 30  --   --   ALKPHOS 161*  --   --   BILITOT 0.6  --   --   GFRNONAA 40* 39* 52*  GFRAA 47* 46* >60  ANIONGAP 12 11 10      Hematology Recent Labs  Lab 01/04/19 1121 01/06/19 0527 01/07/19 0402  WBC 8.2 9.9 9.5  RBC 3.07* 3.17* 3.14*  HGB 9.7* 9.9* 9.8*  HCT 29.8* 30.2* 30.4*  MCV 97.1 95.3 96.8  MCH 31.6 31.2 31.2  MCHC 32.6 32.8 32.2  RDW 15.0 15.0 15.0  PLT 509* 550* 520*    Cardiac EnzymesNo results for input(s): TROPONINI in the last 168 hours. No results for input(s): TROPIPOC in the last 168 hours.   BNP Recent Labs  Lab 01/04/19 1121  BNP 353.0*     DDimer No results for input(s): DDIMER in the last 168 hours.   Radiology    Dg Chest 1  View  Result Date: 01/06/2019 IMPRESSION: 1. Cardiomegaly with central pulmonary vascular congestion suggesting mild CHF/volume overload, likely superimposed on chronic bronchitic change and/or chronic interstitial lung disease. 2. Stable bilateral pleural effusions, moderate on the LEFT and small on the RIGHT. 3. Pericardial drain remains in place with tip at the midline. Electronically Signed   By: Franki Cabot M.D.   On: 01/06/2019 11:27    Cardiac Studies   2D Echo 01/04/2019: 1. The left ventricle has normal systolic function with an ejection fraction of 60-65%. The cavity size was normal. There is moderately increased left ventricular wall thickness. Indeterminate diastolic filling due to E-A fusion.  2. The right ventricle has normal systolic function. The cavity was normal. There is no increase in right ventricular wall thickness. Right ventricular systolic pressure is moderately elevated with an estimated pressure of 47.4 mmHg.  3. Left atrial size was moderately  dilated.  4. Findings are consistent with cardiac tamponade.  5. Large pericardial effusion.  6. Moderate pleural effusion in the left lateral region.  7. The pericardial effusion is circumferential.  8. There is inversion of the right atrial wall and excessive respiratory variation in the mitral valve spectral Doppler velocities.  9. The mitral valve is degenerative. Mild thickening of the mitral valve leaflet. There is mild mitral annular calcification present. 10. The aortic valve has an indeterminate number of cusps. Mild thickening of the aortic valve. 11. The aortic root is normal in size and structure. 12. The inferior vena cava was normal in size with <50% respiratory variability. 13. The interatrial septum was not well visualized. __________  Pericardiocentesis 01/04/2019: Conclusions: 1. Successful ultrasound and fluoroscopic-guided pericardiocentesis and pericardial drain placement from a subxiphoid approach, yielding 800 mL of blood fluid.  Recommendations: 1. Follow-up fluid analyses. 2. STAT PCXR when patient arrives in the ICU. 3. Repeat limited echocardiogram tomorrow to reassess effusion. __________  2D Echo 01/05/2019: 1. The left ventricle has normal systolic function, with an ejection fraction of 55-60%. The cavity size was normal. There is mildly increased left ventricular wall thickness. No evidence of left ventricular regional wall motion abnormalities. Septal-lateral dyssynchrony consistent wtih LBBB.  2. The right ventricle has normal systolc function. The cavity was normal. There is no increase in right ventricular wall thickness.  3. Small pericardial effusion, no evidence for tamponade.  4. There was a left-sided pleural effusion.  5. The IVC was normal in size. No complete TR doppler jet so unable to estimate PA systolic pressure.  6. Limited echo to reassess pericardial effusion.  Patient Profile     85 y.o. female with history of PVCs, left bundle branch  block, anxiety, depression, lumbar compression fracture, osteoporosis, and GERD who was admitted with pericardial effusion s/p pericardiocentesis.   Assessment & Plan    1. Pericardial effusion: -Status post pericardiocentesis on 6/26 with removal of pericardial drain on 6/28 -Sinus tachycardia persists  -Recheck stat limited echo today to assess for recurrence of effusion following removal of pericardial drain given sinus tachycardia and relative hypotension  -Cytology of pericardial fluid is pending  2. Anemia: -Stable  3. AKI: -Improved   4. Acute hypoxic respiratory distress: -Improved   For questions or updates, please contact [place] Please consult [email or web address] for contact info under Cardiology/STEMI.    Signed, Christell Faith, PA-C [facility] Pager: [phone] 01/07/2019, 9:28 AM

## 2019-01-07 NOTE — Patient Instructions (Signed)
Shortness of Breath, Adult Shortness of breath means you have trouble breathing. Shortness of breath could be a sign of a medical problem. Follow these instructions at home:   Watch for any changes in your symptoms.  Do not use any products that contain nicotine or tobacco, such as cigarettes, e-cigarettes, and chewing tobacco.  Do not smoke. Smoking can cause shortness of breath. If you need help to quit smoking, ask your doctor.  Avoid things that can make it harder to breathe, such as: ? Mold. ? Dust. ? Air pollution. ? Chemical smells. ? Things that can cause allergy symptoms (allergens), if you have allergies.  Keep your living space clean. Use products that help remove mold and dust.  Rest as needed. Slowly return to your normal activities.  Take over-the-counter and prescription medicines only as told by your doctor. This includes oxygen therapy and inhaled medicines.  Keep all follow-up visits as told by your doctor. This is important. Contact a doctor if:  Your condition does not get better as soon as expected.  You have a hard time doing your normal activities, even after you rest.  You have new symptoms. Get help right away if:  Your shortness of breath gets worse.  You have trouble breathing when you are resting.  You feel light-headed or you pass out (faint).  You have a cough that is not helped by medicines.  You cough up blood.  You have pain with breathing.  You have pain in your chest, arms, shoulders, or belly (abdomen).  You have a fever.  You cannot walk up stairs.  You cannot exercise the way you normally do. These symptoms may represent a serious problem that is an emergency. Do not wait to see if the symptoms will go away. Get medical help right away. Call your local emergency services (911 in the [place] Do not drive yourself to the hospital. Summary  Shortness of breath is when you have trouble breathing enough air. It can be a sign of a  medical problem.  Avoid things that make it hard for you to breathe, such as smoking, pollution, mold, and dust.  Watch for any changes in your symptoms. Contact your doctor if you do not get better or you get worse. This information is not intended to replace advice given to you by your health care provider. Make sure you discuss any questions you have with your health care provider. Document Released: 12/14/2007 Document Revised: 11/27/2017 Document Reviewed: 11/27/2017 Elsevier Patient Education  2020 [employer].

## 2019-01-07 NOTE — Evaluation (Signed)
Physical Therapy Evaluation Patient Details Name: Lacora Folmer MRN: [id number] DOB: [birth year] Today's Date: 01/07/2019   History of Present Illness  presented to ER secondary to progressive SOB; admitted for management of pericardial effusion.  s/p pericardiocentesis with drain placement 6/26, drain removed 6/28.  Clinical Impression  Upon evaluation, patient alert and oriented; follows commands and demonstrates good effort with mobility tasks.  Eager for mobility as able.  Bilat UE/LE strength and ROM grossly symmetrical and WFL; no focal weakness, pain reported.  Able to complete sit/stand, basic transfers and gait (25-40') with RW, cga/close sup.  Intermittent cuing for slower cadence and rate of movement, increased use of pursed lip breathing with all mobility efforts. Did note desat to 84-85% on RA with exertion, requiring 3-4 min seated rest period and pursed lip breathing for recovery >90%.  May benefit from trial of O2 with exertion in subsequent sessions; too fatigued to attempt this date. Would benefit from skilled PT to address above deficits and promote optimal return to PLOF; recommend transition to STR upon discharge from acute hospitalization to allow patient additional time to build endurance/activity tolerance necessary to require safe transition home alone.     Follow Up Recommendations SNF    Equipment Recommendations       Recommendations for Other Services       Precautions / Restrictions Precautions Precautions: Fall Restrictions Weight Bearing Restrictions: No      Mobility  Bed Mobility               General bed mobility comments: ambulating to bathroom start of session, in recliner end of session  Transfers Overall transfer level: Needs assistance Equipment used: Rolling walker (2 wheeled) Transfers: Sit to/from Stand Sit to Stand: Min guard;Supervision            Ambulation/Gait Ambulation/Gait assistance: Min guard;Supervision Gait  Distance (Feet): (25' x1, 40' x1) Assistive device: Rolling walker (2 wheeled)       General Gait Details: reciprocal stepping pattern, slightly impulsive and brisk in cadence; min cuing for slower, controlled gait with emphasis on pursed lip breathing during activity.  Desat to 84-85% on RA with exertion (mod SOB noted), requiring extended seated rest period (3-4 minutes) and pursed lip breathing for recovery >90%.  Stairs            Wheelchair Mobility    Modified Rankin (Stroke Patients Only)       Balance Overall balance assessment: Needs assistance Sitting-balance support: No upper extremity supported;Feet supported Sitting balance-Leahy Scale: Good     Standing balance support: Bilateral upper extremity supported Standing balance-Leahy Scale: Fair                               Pertinent Vitals/Pain Pain Assessment: No/denies pain    Home Living Family/patient expects to be discharged to:: Assisted living               Home Equipment: Gilford Rile - 4 wheels Additional Comments: Resident of [facility]    Prior Function Level of Independence: Independent with assistive device(s)         Comments: Indep with [id number] for ADLs, household and limited community mobilization; no home O2.  Enjoys daily exercise, stays active as able     Hand Dominance        Extremity/Trunk Assessment   Upper Extremity Assessment Upper Extremity Assessment: Overall WFL for tasks assessed    Lower Extremity Assessment  Lower Extremity Assessment: Overall WFL for tasks assessed(grossly 4/5 throughout)       Communication   Communication: HOH  Cognition Arousal/Alertness: Awake/alert Behavior During Therapy: WFL for tasks assessed/performed Overall Cognitive Status: Within Functional Limits for tasks assessed                                        General Comments      Exercises Other Exercises Other Exercises: Introduced role of  activity pacing, energy conservation with functional activities to maintain adequate O2 with functional activities.  Patient voiced understanding, but did voice it will be difficult, as she is used to "pushing it until the end" Other Exercises: Toilet transfer with RW, sup; standing balance at sink for hand hygiene, close sup.  Fair/good awareness of limits of stability, utilizing appropraite safety strategies as needed.   Assessment/Plan    PT Assessment Patient needs continued PT services  PT Problem List Decreased strength;Decreased range of motion;Decreased activity tolerance;Decreased balance;Decreased mobility;Cardiopulmonary status limiting activity       PT Treatment Interventions DME instruction;Gait training;Functional mobility training;Therapeutic activities;Therapeutic exercise;Balance training;Patient/family education    PT Goals (Current goals can be found in the Care Plan section)  Acute Rehab PT Goals Patient Stated Goal: to return home PT Goal Formulation: With patient Time For Goal Achievement: 01/21/19 Potential to Achieve Goals: Good    Frequency Min 2X/week   Barriers to discharge        Co-evaluation               AM-PAC PT "6 Clicks" Mobility  Outcome Measure Help needed turning from your back to your side while in a flat bed without using bedrails?: A Little Help needed moving from lying on your back to sitting on the side of a flat bed without using bedrails?: A Little Help needed moving to and from a bed to a chair (including a wheelchair)?: A Little Help needed standing up from a chair using your arms (e.g., wheelchair or bedside chair)?: A Little Help needed to walk in hospital room?: A Little Help needed climbing 3-5 steps with a railing? : A Little 6 Click Score: 18    End of Session Equipment Utilized During Treatment: Gait belt Activity Tolerance: Patient tolerated treatment well Patient left: in chair;with chair alarm set(pad under  patient, RN to locate box as available (not present in room)) Nurse Communication: Mobility status PT Visit Diagnosis: Difficulty in walking, not elsewhere classified (R26.2)    Time: 2426-8341 PT Time Calculation (min) (ACUTE ONLY): 23 min   Charges:   PT Evaluation $PT Eval Moderate Complexity: 1 Mod PT Treatments $Therapeutic Activity: 8-22 mins        Chenee Munns H. Owens Shark, PT, DPT, NCS 01/07/19, 11:57 AM [phone]

## 2019-01-07 NOTE — Evaluation (Signed)
Occupational Therapy Evaluation Patient Details Name: Bridget Mendez MRN: [id number] DOB: 18-Dec-1919 Today's Date: 01/07/2019    History of Present Illness presented to ER secondary to progressive SOB; admitted for management of pericardial effusion.  s/p pericardiocentesis with drain placement 6/26, drain removed 6/28.   Clinical Impression   [name] was seen for OT evaluation this date. Pt was independent in all ADLs and mobility, living in a at [facility]. No supplementary oxygen used prior to admission. She reports trying her best to stay active and follow her routine although she states this has become more difficult as of late. Pt reports becoming easily fatigued or out of breath with minimal exertion, requiring some assistance for lower body ADL tasks and bathing. Pt currently requires supervision assist for general ADL tasks with heavy cueing to utilize ECS. Supervision to [place] assist for mobility due to poor activity tolerance. Pt educated in energy conservation conservation strategies including pursed lip breathing, activity pacing, home/routines modifications, work simplification, AE/DME, prioritizing of meaningful occupations, and falls prevention. Handout provided. Pt verbalized understanding but would benefit from additional skilled OT services to maximize recall and carryover of learned techniques and facilitate implementation of learned techniques into daily routines. Upon discharge, recommend STR to maximize pt safety and return to PLOF.      Follow Up Recommendations  SNF    Equipment Recommendations  Other (comment)(TBD)    Recommendations for Other Services       Precautions / Restrictions Precautions Precautions: Fall Restrictions Weight Bearing Restrictions: No      Mobility Bed Mobility               General bed mobility comments: In room recliner at start/end of session.  Transfers Overall transfer level: Needs assistance Equipment used:  Rolling walker (2 wheeled) Transfers: Sit to/from Stand Sit to Stand: Min guard;Supervision         General transfer comment: Pt able to ambuate within room with supervision to [place]. Is able to complete STS from room toilet with CGA and min VCs for hand placement. Requires regular prompting to use PLB during amb.    Balance Overall balance assessment: Needs assistance Sitting-balance support: No upper extremity supported;Feet supported Sitting balance-Leahy Scale: Good     Standing balance support: During functional activity;Single extremity supported Standing balance-Leahy Scale: Fair Standing balance comment: While standing to complete oral care, pt able to stand w/o UE support for brief periods. Req. 1 UE support on sink t/o most of task.                           ADL either performed or assessed with clinical judgement   ADL Overall ADL's : Needs assistance/impaired                                       General ADL Comments: Pt req. consistent VCs for use of ECS during ADL tasks. Fatigues easily and becomes SOB. At this time is not able to complete LB dressing independently. Can transfer to room toilet with supervision assist and RW. Stood at sink to complete oral care and hand hygiene on this date. Required heavy cueing from therapist for PLB technique.     Vision         Perception     Praxis      Pertinent Vitals/Pain Pain Assessment: No/denies pain  Hand Dominance Right   Extremity/Trunk Assessment Upper Extremity Assessment Upper Extremity Assessment: Generalized weakness(Grossly 3+/5.)   Lower Extremity Assessment Lower Extremity Assessment: Overall WFL for tasks assessed       Communication Communication Communication: HOH   Cognition Arousal/Alertness: Awake/alert Behavior During Therapy: WFL for tasks assessed/performed Overall Cognitive Status: Within Functional Limits for tasks assessed                                      General Comments  Pt with SpO2 of 93 at rest in room recliner. With ambulation SpO2 dropped to 83, but rebounded to 90 with cues for PLB. Pt fatigued easily during functional activity. Encourated to take regular rest breaks. Pt expresses frustration over this "new normal" in regards to her fatigue.After functional acivity, pt SpO2 at 89 once back to chair. Rebounded to 94 with cues for PLB and rest.    Exercises Other Exercises Other Exercises: Pt educated on ECS including activity pacing, planning ahead, prioritizing important activities, and positioning herself to maximize energy efficiency. Pt also educated in PLB as ECS and able to return demonstrate with moderate VCs from this therapist. ECS handout left in room, but not reviewed in detail Other Exercises: Toilet transfer with RW, sup; standing balance at sink for hand hygiene/oral care close sup.  Required regular prompting in order to utilize rest breaks and PLB t/o ADL tasks.   Shoulder Instructions      Home Living Family/patient expects to be discharged to:: Assisted living                             Home Equipment: Gilford Rile - 4 wheels   Additional Comments: Resident of [facility]      Prior Functioning/Environment Level of Independence: Independent with assistive device(s)        Comments: Indep with [id number] for ADLs, household and limited community mobilization; no home O2.  Enjoys daily exercise, stays active as able        OT Problem List: Decreased strength;Decreased coordination;Cardiopulmonary status limiting activity;Decreased activity tolerance;Decreased safety awareness;Decreased knowledge of use of DME or AE;Impaired balance (sitting and/or standing)      OT Treatment/Interventions: Self-care/ADL training;Therapeutic exercise;Therapeutic activities;Energy conservation;DME and/or AE instruction;Patient/family education;Balance training;Modalities    OT Goals(Current goals can  be found in the care plan section) Acute Rehab OT Goals Patient Stated Goal: to return home OT Goal Formulation: With patient Time For Goal Achievement: 01/21/19 Potential to Achieve Goals: Good ADL Goals Pt Will Perform Grooming: with modified independence;sitting(With LRAD PRN for improved safety and functional independence.) Pt Will Perform Upper Body Dressing: with modified independence;sitting(With LRAD PRN for improved safety and functional independence.) Pt Will Perform Lower Body Dressing: with modified independence;sit to/from stand(With LRAD PRN for improved safety and functional independence.) Additional ADL Goal #1: Pt will independently demonstrate understanding of ECS by independently implementing at least 2 learned ECS during an ADL activity with min VCs from therapist upon hospital DC.  OT Frequency: Min 1X/week   Barriers to D/C:            Co-evaluation              AM-PAC OT "6 Clicks" Daily Activity     Outcome Measure Help from another person eating meals?: None Help from another person taking care of personal grooming?: A Little Help from another person  toileting, which includes using toliet, bedpan, or urinal?: A Little Help from another person bathing (including washing, rinsing, drying)?: A Lot Help from another person to put on and taking off regular upper body clothing?: A Little Help from another person to put on and taking off regular lower body clothing?: A Lot 6 Click Score: 17   End of Session Equipment Utilized During Treatment: Gait belt;Rolling walker Nurse Communication: Other (comment)(Pt SpO2 during functional activity. Pt assisted to bathroom.)  Activity Tolerance: Patient tolerated treatment well Patient left: in chair;with call bell/phone within reach;with chair alarm set  OT Visit Diagnosis: Other abnormalities of gait and mobility (R26.89);Muscle weakness (generalized) (M62.81)                Time: 5038-8828 OT Time Calculation  (min): 52 min Charges:  OT General Charges $OT Visit: 1 Visit OT Evaluation $OT Eval Low Complexity: 1 Low OT Treatments $Self Care/Home Management : 38-52 mins  Shara Blazing, M.S., OTR/L Ascom: [phone] 01/07/19, 3:10 PM

## 2019-01-07 NOTE — Progress Notes (Addendum)
Report called to [facility]. 0900 Transferred to 249 via bed.

## 2019-01-08 LAB — BODY FLUID CULTURE
Culture: NO GROWTH
Special Requests: NORMAL

## 2019-01-08 MED ORDER — FUROSEMIDE 20 MG PO TABS
20.0000 mg | ORAL_TABLET | Freq: Every day | ORAL | Status: DC
  Administered 2019-01-08 – 2019-01-10 (×3): 20 mg via ORAL
  Filled 2019-01-08 (×3): qty 1

## 2019-01-08 MED ORDER — METOPROLOL SUCCINATE ER 100 MG PO TB24
100.0000 mg | ORAL_TABLET | Freq: Every day | ORAL | Status: DC
  Administered 2019-01-08 – 2019-01-10 (×3): 100 mg via ORAL
  Filled 2019-01-08 (×3): qty 1

## 2019-01-08 NOTE — Progress Notes (Signed)
Progress Note  Patient Name: Bridget Mendez Date of Encounter: 01/08/2019  Primary Cardiologist: Dr. Rockey Situ  Subjective   Denies chest pain this AM. She reported ongoing breathing issues with working on breathing correctly / using her spirometer.   Inpatient Medications    Scheduled Meds:  acidophilus  1 capsule Oral Daily   cholecalciferol  1,000 Units Oral Daily   feeding supplement (ENSURE ENLIVE)  237 mL Oral BID BM   metoprolol succinate  100 mg Oral Daily   multivitamin with minerals  1 tablet Oral Daily   pantoprazole  40 mg Oral Daily   Continuous Infusions:  PRN Meds: acetaminophen **OR** acetaminophen, albuterol, ALPRAZolam, calcium carbonate, docusate sodium, HYDROcodone-acetaminophen, nitroGLYCERIN, ondansetron **OR** ondansetron (ZOFRAN) IV, polyethylene glycol, psyllium   Vital Signs    Vitals:   01/07/19 1636 01/07/19 1929 01/08/19 0347 01/08/19 0758  BP: 136/68 139/72 (!) 147/91 101/83  Pulse: (!) 102 (!) 103 (!) 112 (!) 104  Resp:  20 20   Temp: 98.7 F (37.1 C) (!) 97.4 F (36.3 C) 97.8 F (36.6 C) 98.6 F (37 C)  TempSrc: Oral Oral Oral Oral  SpO2: 91% 94% 91% 93%  Weight:   87.5 kg   Height:        Intake/Output Summary (Last 24 hours) at 01/08/2019 1147 Last data filed at 01/08/2019 0917 Gross per 24 hour  Intake 480 ml  Output 1500 ml  Net -1020 ml   Filed Weights   01/06/19 0450 01/07/19 0500 01/08/19 0347  Weight: 78.8 kg 80.4 kg 87.5 kg    Telemetry    Sinus tachycardia with rates 100 to 110s- Personally Reviewed  ECG    No new tracings - Personally Reviewed  Physical Exam   GEN: Elderly and frail appearing female. No acute distress.   Neck: No JVD Cardiac: Tachycardic, no murmurs, rubs, or gallops.  Respiratory: Bibasilar reduced breath sounds GI: Soft, nontender, non-distended  MS: Trace to mild bilateral lower extremity edema. No deformity. Neuro:  Nonfocal, somewhat confused and frustrated at times Psych:  Normal affect   Labs    Chemistry Recent Labs  Lab 01/04/19 1121 01/05/19 0517 01/07/19 0402  NA 129* 135 135  K 5.1 4.4 4.1  CL 95* 101 103  CO2 22 23 22   GLUCOSE 121* 141* 114*  BUN 22 19 16   CREATININE 1.15* 1.17* 0.93  CALCIUM 8.1* 8.1* 7.7*  PROT 6.7  --   --   ALBUMIN 3.0*  --   --   AST 26  --   --   ALT 30  --   --   ALKPHOS 161*  --   --   BILITOT 0.6  --   --   GFRNONAA 40* 39* 52*  GFRAA 47* 46* >60  ANIONGAP 12 11 10      Hematology Recent Labs  Lab 01/04/19 1121 01/06/19 0527 01/07/19 0402  WBC 8.2 9.9 9.5  RBC 3.07* 3.17* 3.14*  HGB 9.7* 9.9* 9.8*  HCT 29.8* 30.2* 30.4*  MCV 97.1 95.3 96.8  MCH 31.6 31.2 31.2  MCHC 32.6 32.8 32.2  RDW 15.0 15.0 15.0  PLT 509* 550* 520*    Cardiac EnzymesNo results for input(s): TROPONINI in the last 168 hours. No results for input(s): TROPIPOC in the last 168 hours.   BNP Recent Labs  Lab 01/04/19 1121  BNP 353.0*     DDimer No results for input(s): DDIMER in the last 168 hours.   Radiology    No results  found.  Cardiac Studies   Limited echo 6/29  1. The left ventricle has normal systolic function, with an ejection fraction of 55-60%. The cavity size was normal. There is mild concentric left ventricular hypertrophy. Left ventricular diastolic Doppler parameters are consistent with impaired  relaxation.  2. The right ventricle has normal systolc function. The cavity was normal. There is no increase in right ventricular wall thickness.  3. Moderate pleural effusion in the left lateral region.  4. Small pericardial effusion.  5. The pericardial effusion is posterior to the left ventricle.  6. The tricuspid valve was grossly normal.  7. Moderate thickening of the aortic valve Moderate calcification of the aortic valve. Aortic valve regurgitation was not assessed by color flow Doppler.  8. Pulmonic valve regurgitation was not assessed by color flow Doppler.   2D Echo 01/04/2019: 1. The left ventricle  has normal systolic function with an ejection fraction of 60-65%. The cavity size was normal. There is moderately increased left ventricular wall thickness. Indeterminate diastolic filling due to E-A fusion. 2. The right ventricle has normal systolic function. The cavity was normal. There is no increase in right ventricular wall thickness. Right ventricular systolic pressure is moderately elevated with an estimated pressure of 47.4 mmHg. 3. Left atrial size was moderately dilated. 4. Findings are consistent with cardiac tamponade. 5. Large pericardial effusion. 6. Moderate pleural effusion in the left lateral region. 7. The pericardial effusion is circumferential. 8. There is inversion of the right atrial wall and excessive respiratory variation in the mitral valve spectral Doppler velocities. 9. The mitral valve is degenerative. Mild thickening of the mitral valve leaflet. There is mild mitral annular calcification present. 10. The aortic valve has an indeterminate number of cusps. Mild thickening of the aortic valve. 11. The aortic root is normal in size and structure. 12. The inferior vena cava was normal in size with <50% respiratory variability. 13. The interatrial septum was not well visualized. __________  Pericardiocentesis 01/04/2019: Conclusions: 1. Successful ultrasound and fluoroscopic-guided pericardiocentesis and pericardial drain placement from a subxiphoid approach, yielding 800 mL of blood fluid.  Recommendations: 1. Follow-up fluid analyses. 2. STAT PCXR when patient arrives in the ICU. 3. Repeat limited echocardiogram tomorrow to reassess effusion. __________  2D Echo 01/05/2019: 1. The left ventricle has normal systolic function, with an ejection fraction of 55-60%. The cavity size was normal. There is mildly increased left ventricular wall thickness. No evidence of left ventricular regional wall motion abnormalities. Septal-lateral dyssynchrony consistent wtih  LBBB. 2. The right ventricle has normal systolc function. The cavity was normal. There is no increase in right ventricular wall thickness. 3. Small pericardial effusion, no evidence for tamponade. 4. There was a left-sided pleural effusion. 5. The IVC was normal in size. No complete TR doppler jet so unable to estimate PA systolic pressure. 6. Limited echo to reassess pericardial effusion.  Patient Profile     85 y.o. female with history of PVCs, LBBB, anxiety, depression, lumbar compression fracture, osteoporosis, and GERD, who was admitted with pericardial effusion s/p pericardiocentesis.  Assessment & Plan    Pericardial effusion - S/p pericardiocentesis on 6/26 with removal of pericardial drain 6/28. Cytology of pericardial effusion still pending. - Limited echo 6/29 as above with EF 55-60%, mild concentric LVH, G1DD, moderate pleural effusion in left lateral region, small pericardial effusion posterior to LV, moderate thickening of AV.  - No further intervention recommended at this time. Recommendation for ambulation before discharge. Will continue to attempt to contact family with  updated results of this limited echo. Will need scheduled for outpatient follow-up with primary cardiologist.  Anemia - Stable with Hgb remaining ~9.8-9.9. Continue to monitor.   AKI - Improved renal function this admission. Cr 1.17  0.93. BUN 19  16.  Hyperkalemia - K improving 5.0  4.7. Continue to monitor. K goal 4.0.  Acute hypoxic respiratory distress - Improved. Continue spirometry. Recommendation for ambulation prior to discharge.    For questions or updates, please contact [place] Please consult [email or web address] for contact info under        Signed, Arvil Chaco, PA-C  01/08/2019, 11:47 AM

## 2019-01-08 NOTE — Plan of Care (Signed)
  Problem: Education: Goal: Knowledge of General Education information will improve Description: Including pain rating scale, medication(s)/side effects and non-pharmacologic comfort measures Outcome: Progressing   Problem: Activity: Goal: Risk for activity intolerance will decrease Outcome: Progressing Note: Patient up to the bathroom with walker.    Problem: Safety: Goal: Ability to remain free from injury will improve Outcome: Progressing   Problem: Skin Integrity: Goal: Risk for impaired skin integrity will decrease Outcome: Progressing   Problem: Coping: Goal: Level of anxiety will decrease Outcome: Not Progressing Note: Despite PRN anti-anxiety med, patient still very anxious.

## 2019-01-08 NOTE — NC FL2 (Signed)
[place] LEVEL OF Mendez SCREENING TOOL     IDENTIFICATION  Patient Name: Bridget Mendez Birthdate: 1919/11/09 Sex: female Admission Date (Current Location): 01/04/2019  [place] and [place] Number:  Engineering geologist and Address:  [facility], [address], [place], [place] [address]      Provider Number: [id number]  Attending Physician Name and Address:  Hillary Bow, MD  Relative Name and Phone Number:  Karie Chimera [facility] Mendez/FINANCIAL) Daughter [phone]  [phone] or White,Greg(POA Mendez) Pandora Leiter [phone]  [phone] or Ercell, Perlman [phone]    Current Level of Mendez: Hospital Recommended Level of Mendez: [place] Prior Approval Number:    Date Approved/Denied:   PASRR Number: [id number]  Discharge Plan: SNF    Current Diagnoses: Patient Active Problem List   Diagnosis Date Noted  . Respiratory failure ([place]) 01/04/2019  . Pericardial effusion   . Lobar pneumonia ([place]) 12/27/2018  . Chronic narcotic dependence ([place]) 12/27/2018  . SVT (supraventricular tachycardia) ([place]) 07/06/2018  . Episodic mood disorder ([place]) 04/28/2010  . ALLERGIC RHINITIS 09/15/2008  . GERD 10/24/2006  . Primary osteoarthritis involving multiple joints 10/24/2006  . Osteoporosis 10/24/2006    Orientation RESPIRATION BLADDER Height & Weight     Self, Time, Situation, Place  Normal Continent Weight: 193 lb (87.5 kg) Height:  5\' 3"  (160 cm)  BEHAVIORAL SYMPTOMS/MOOD NEUROLOGICAL BOWEL NUTRITION STATUS      Continent Diet  AMBULATORY STATUS COMMUNICATION OF NEEDS Skin   Limited Assist Verbally Normal                       Personal Mendez Assistance Level of Assistance  Bathing, Feeding, Dressing Bathing Assistance: Limited assistance Feeding assistance: Independent Dressing Assistance: Limited assistance     Functional Limitations Info  Sight, Hearing, Speech Sight Info: Adequate Hearing Info:  Adequate Speech Info: Adequate    SPECIAL Mendez FACTORS FREQUENCY  PT (By licensed PT), OT (By licensed OT)     PT Frequency: Mininum 5x a week OT Frequency: Minimum 5x a week            Contractures Contractures Info: Not present    Additional Factors Info  Code Status Code Status Info: DNR             Current Medications (01/08/2019):  This is the current hospital active medication list Current Facility-Administered Medications  Medication Dose Route Frequency Provider Last Rate Last Dose  . acetaminophen (TYLENOL) tablet 650 mg  650 mg Oral Q6H PRN Allred, Jeneen Rinks, MD       Or  . acetaminophen (TYLENOL) suppository 650 mg  650 mg Rectal Q6H PRN Allred, Jeneen Rinks, MD      . acidophilus (RISAQUAD) capsule 1 capsule  1 capsule Oral Daily Thompson Grayer, MD   1 capsule at 01/08/19 0924  . albuterol (PROVENTIL) (2.5 MG/3ML) 0.083% nebulizer solution 2.5 mg  2.5 mg Nebulization Q6H PRN Allred, James, MD      . ALPRAZolam [name]) tablet 0.25-0.5 mg  0.25-0.5 mg Oral BID PRN Thompson Grayer, MD   0.5 mg at 01/08/19 0924  . calcium carbonate (TUMS - dosed in mg elemental calcium) chewable tablet 600 mg of elemental calcium  3 tablet Oral Daily PRN Allred, Jeneen Rinks, MD      . cholecalciferol (VITAMIN D3) tablet 1,000 Units  1,000 Units Oral Daily Thompson Grayer, MD   1,000 Units at 01/08/19 [phone]  . docusate sodium (COLACE) capsule 100 mg  100 mg Oral Daily  PRN Ottie Glazier, MD      . feeding supplement (ENSURE ENLIVE) (ENSURE ENLIVE) liquid 237 mL  237 mL Oral BID BM Allred, James, MD      . furosemide (LASIX) tablet 20 mg  20 mg Oral Daily End, Christopher, MD   20 mg at 01/08/19 1402  . HYDROcodone-acetaminophen (NORCO/VICODIN) 5-325 MG per tablet 1 tablet  1 tablet Oral Q6H PRN Allred, Jeneen Rinks, MD      . metoprolol succinate (TOPROL-XL) 24 hr tablet 100 mg  100 mg Oral Daily Hillary Bow, MD   100 mg at 01/08/19 0923  . multivitamin with minerals tablet 1 tablet  1 tablet Oral Daily Allred,  Jeneen Rinks, MD   1 tablet at 01/08/19 9417  . nitroGLYCERIN (NITROSTAT) SL tablet 0.4 mg  0.4 mg Sublingual Q5 min PRN Allred, Jeneen Rinks, MD      . ondansetron (ZOFRAN) tablet 4 mg  4 mg Oral Q6H PRN Allred, Jeneen Rinks, MD       Or  . ondansetron (ZOFRAN) injection 4 mg  4 mg Intravenous Q6H PRN Allred, Jeneen Rinks, MD      . pantoprazole (PROTONIX) EC tablet 40 mg  40 mg Oral Daily Thompson Grayer, MD   40 mg at 01/08/19 4081  . polyethylene glycol (MIRALAX / GLYCOLAX) packet 17 g  17 g Oral Daily PRN Allred, Jeneen Rinks, MD      . psyllium (HYDROCIL/METAMUCIL) packet 1 packet  1 packet Oral Daily PRN Ottie Glazier, MD         Discharge Medications: Please see discharge summary for a list of discharge medications.  Relevant Imaging Results:  Relevant Lab Results:   Additional Information SSN [id number]  Ross Ludwig, LCSW

## 2019-01-08 NOTE — Progress Notes (Signed)
OT Cancellation Note  Patient Details Name: Bridget Mendez MRN: [id number] DOB: May 28, 1920   Cancelled Treatment:    Reason Eval/Treat Not Completed: Patient declined, no reason specified. Upon attempt, pt eagerly requesting PT, declines the need for OT. Pt eager to work with PT to walk around unit so pt can "prove to the doctor that I can do it." This therapist called PT. Pt not on schedule for PT this date but confirmed that pt is a priority to be seen tomorrow. Pt pleased with this response. Continues to politely decline OT at this time despite encouragement. Will re-attempt at later date/time as appropriate.   Jeni Salles, MPH, MS, OTR/L ascom [phone] 01/08/19, 3:59 PM

## 2019-01-08 NOTE — Progress Notes (Signed)
Spoke to patient's daughter Coralyn Mark, updated over the phone. Requested to speak to MD/Cardiologist. Dr. Darvin Neighbours and Dr. Saunders Revel made aware.

## 2019-01-08 NOTE — Progress Notes (Signed)
[place] [facility] [place] NAME: Bridget Mendez    MR#:  [id number]  DATE OF BIRTH:  [birth year]  SUBJECTIVE:   Continues to have shortness of breath.  Ambulated to the bathroom and back and has significant tachypnea. No chest pain.  Pericardial drain removed on 01/06/2019.  REVIEW OF SYSTEMS:    Review of Systems  Constitutional: Negative for fever, chills weight loss HENT: Negative for ear pain, nosebleeds, congestion, facial swelling, rhinorrhea, neck pain, neck stiffness and ear discharge.   Respiratory: Positive for shortness of breath Cardiovascular: Negative for chest pain, palpitations and leg swelling.  Gastrointestinal: Negative for heartburn, abdominal pain, vomiting, diarrhea or consitpation Genitourinary: Negative for dysuria, urgency, frequency, hematuria Musculoskeletal: Negative for back pain or joint pain Neurological: Negative for dizziness, seizures, syncope, focal weakness,  numbness and headaches.  Hematological: Does not bruise/bleed easily.  Psychiatric/Behavioral: Negative for hallucinations, confusion, dysphoric mood  Tolerating Diet: yes  DRUG ALLERGIES:  No Known Allergies  VITALS:  Blood pressure 101/83, pulse (!) 104, temperature 98.6 F (37 C), temperature source Oral, resp. rate 20, height 5\' 3"  (1.6 m), weight 87.5 kg, SpO2 93 %.  PHYSICAL EXAMINATION:  Constitutional: Appears well-developed and well-nourished. No distress. HENT: Normocephalic. Marland Kitchen Oropharynx is clear and moist.  Eyes: Conjunctivae and EOM are normal. PERRLA, no scleral icterus.  Neck: Normal ROM. Neck supple. No JVD. No tracheal deviation. CVS: tachycardic S1/S2 +, no murmurs, no gallops, no carotid bruit.  Pulmonary: Effort and breath sounds normal, no stridor, rhonchi, wheezes, rales.  Abdominal: Soft. BS +,  no distension, tenderness, rebound or guarding.  Musculoskeletal: Normal range of motion. No edema and no tenderness.  Neuro: Alert.  CN 2-12 grossly intact. No focal deficits. Skin: Skin is warm and dry. No rash noted. Psychiatric: Normal mood and affect.   LABORATORY PANEL:   CBC Recent Labs  Lab 01/07/19 0402  WBC 9.5  HGB 9.8*  HCT 30.4*  PLT 520*   ------------------------------------------------------------------------------------------------------------------  Chemistries  Recent Labs  Lab 01/04/19 1121  01/07/19 0402  NA 129*   < > 135  K 5.1   < > 4.1  CL 95*   < > 103  CO2 22   < > 22  GLUCOSE 121*   < > 114*  BUN 22   < > 16  CREATININE 1.15*   < > 0.93  CALCIUM 8.1*   < > 7.7*  AST 26  --   --   ALT 30  --   --   ALKPHOS 161*  --   --   BILITOT 0.6  --   --    < > = values in this interval not displayed.   ------------------------------------------------------------------------------------------------------------------  Cardiac Enzymes No results for input(s): TROPONINI in the last 168 hours. ------------------------------------------------------------------------------------------------------------------  RADIOLOGY:  No results found. ASSESSMENT AND PLAN:   85 year old female with a history of osteoporosis who presented to the ER with shortness of breath and found to have pericardial effusion with tamponade physiology.  1.  Acute hypoxic respiratory failure in the setting of pericardial effusion large with early evidence of tamponade physiology(hemopericardium):  Pericardiocentesis on 01/04/2019.  Drain removed on 01/06/2019. Repeat echo ordered shows improvement Cytology pending from pericardial fluid Patient weaned off of oxygen. COVID testing negative.  2.  Acute on chronic anemia in the setting of hemopericardium Hgb stable this am  3.  Anxiety: Continue PRN Xanax  4.  History of PVCs: Continue metoprolol  5. Hyponatremia :  resolved  6. Bilateral pleural effusions Start lasix 20 mg daily  Management plans discussed with the patient and she is in agreement. Discussed  with daughter- Bridget Mendez over the phone   CODE STATUS: DNR  TOTAL TIME TAKING CARE OF THIS PATIENT: 35 minutes.   POSSIBLE D/C 1-2 days, DEPENDING ON CLINICAL CONDITION.  Bridget Mendez M.D on 01/08/2019 at 1:28 PM  Between 7am to 6pm - Pager - [phone]  After 6pm go to [email or web address] - password [email or web address] [facility]  Office  [phone]  CC: Primary care physician; Bridget Carbon, MD  Note: This dictation was prepared with Dragon dictation along with smaller phrase technology. Any transcriptional errors that result from this process are unintentional.

## 2019-01-08 NOTE — Care Management Important Message (Signed)
Important Message  Patient Details  Name: Bridget Mendez MRN: [id number] Date of Birth: 01/10/1920   Medicare Important Message Given:  Yes     Dannette Barbara 01/08/2019, 11:04 AM

## 2019-01-08 NOTE — Progress Notes (Signed)
Patient ambulated to the bathroom with walker x2. Oxygen at room air was 95% and upon ambulating it dropped to 93%. Patient short of breath and at one point while ambulating back dropped to 89% briefly but came back up to 93%. Patient brushed her teeth.

## 2019-01-09 ENCOUNTER — Inpatient Hospital Stay: Payer: Medicare Other

## 2019-01-09 ENCOUNTER — Other Ambulatory Visit: Payer: Self-pay | Admitting: Pathology

## 2019-01-09 DIAGNOSIS — R0602 Shortness of breath: Secondary | ICD-10-CM

## 2019-01-09 LAB — GLUCOSE, PLEURAL OR PERITONEAL FLUID: Glucose, Fluid: 130 mg/dL

## 2019-01-09 LAB — BODY FLUID CELL COUNT WITH DIFFERENTIAL
Eos, Fluid: 0 %
Lymphs, Fluid: 62 %
Monocyte-Macrophage-Serous Fluid: 33 %
Neutrophil Count, Fluid: 5 %
Total Nucleated Cell Count, Fluid: 45 cu mm

## 2019-01-09 LAB — LACTATE DEHYDROGENASE, PLEURAL OR PERITONEAL FLUID: LD, Fluid: 141 U/L — ABNORMAL HIGH (ref 3–23)

## 2019-01-09 LAB — BASIC METABOLIC PANEL
Anion gap: 10 (ref 5–15)
BUN: 22 mg/dL (ref 8–23)
CO2: 23 mmol/L (ref 22–32)
Calcium: 8 mg/dL — ABNORMAL LOW (ref 8.9–10.3)
Chloride: 104 mmol/L (ref 98–111)
Creatinine, Ser: 0.96 mg/dL (ref 0.44–1.00)
GFR calc Af Amer: 58 mL/min — ABNORMAL LOW (ref >60)
GFR calc non Af Amer: 50 mL/min — ABNORMAL LOW (ref >60)
Glucose, Bld: 110 mg/dL — ABNORMAL HIGH (ref 70–99)
Potassium: 4 mmol/L (ref 3.5–5.1)
Sodium: 137 mmol/L (ref 135–145)

## 2019-01-09 LAB — PROTEIN, PLEURAL OR PERITONEAL FLUID: Total protein, fluid: 3.4 g/dL

## 2019-01-09 LAB — TSH: TSH: 2.301 u[IU]/mL (ref 0.350–4.500)

## 2019-01-09 LAB — CYTOLOGY - NON PAP

## 2019-01-09 NOTE — TOC Initial Note (Signed)
Transition of Care [facility]) - Initial/Assessment Note    Patient Details  Name: Kelbie Moro MRN: [id number] Date of Birth: July 22, 1919  [facility]) CM/SW Contact:    Ross Ludwig, LCSW Phone Number: 01/09/2019, 6:07 PM  Clinical Narrative:                  Patient is a 85 year old female who is alert and oriented x4.  Patient lives at [place], she states she is very happy in her apartment.  Patient states she would rather just go back to ALF, but she understands that she needs SNF first before returning, since she was just in the hospital last week.  Patient states she was in the SNF side of [place] last year and she was not crazy about it.  Patient states she hopes she will not have to be at [facility] for very long.  CSW reminded her the harder she works the soon she can get back home.  Patient gave CSW permission to send clinicals to [facility].  CSW updated patient's daughter as well, that patient will be going to SNF before returning back to ALF.  Patient was explained how insurance will pay for stay at SNF, and what to expect, patient did not have any other questions or concerns.  Expected Discharge Plan: [place] Barriers to Discharge: Continued Medical Work up   Patient Goals and CMS Choice Patient states their goals for this hospitalization and ongoing recovery are:: To go to [facility] then return back to ALF. CMS Medicare.gov Compare Post Acute Care list provided to:: Patient Choice offered to / list presented to : Patient  Expected Discharge Plan and Services Expected Discharge Plan: [place] In-house Referral: Clinical Social Work     Living arrangements for the past 2 months: [place]                 DME Arranged: N/A DME Agency: NA     Representative spoke with at DME Agency: na            Prior Living Arrangements/Services Living arrangements for the past 2 months: [place] Lives with:: Facility Resident Patient language and need for interpreter reviewed:: Yes Do you feel safe going back to the place where you live?: Yes      Need for Family Participation in Patient Care: No (Comment) Care giver support system in place?: Yes (comment)   Criminal Activity/Legal Involvement Pertinent to Current Situation/Hospitalization: No - Comment as needed  Activities of Daily Living Home Assistive Devices/Equipment: Grab bars around toilet, Grab bars in shower, Hearing aid ADL Screening (condition at time of admission) Patient's cognitive ability adequate to safely complete daily activities?: Yes Is the patient deaf or have difficulty hearing?: Yes Does the patient have difficulty seeing, even when wearing glasses/contacts?: No Does the patient have difficulty concentrating, remembering, or making decisions?: No Patient able to express need for assistance with ADLs?: Yes Does the patient have difficulty dressing or bathing?: No Independently performs ADLs?: Yes (appropriate for developmental age) Does the patient have difficulty walking or climbing stairs?: No Weakness of Legs: Both Weakness of Arms/Hands: None  Permission Sought/Granted Permission sought to share information with : Case Manager, Customer service manager, Family Supports Permission granted to share information with : Yes, Verbal Permission Granted  Share Information with NAME: Karie Chimera (POA CARE/FINANCIAL) Daughter [phone]  [phone]  Permission granted to share info w  AGENCY: SNF admissions        Emotional Assessment Appearance:: Appears younger than stated age Attitude/Demeanor/Rapport: Engaged Affect (typically observed): Calm, Pleasant, Stable Orientation: : Oriented to Self, Oriented to Place, Oriented to  Time, Oriented to Situation Alcohol / Substance Use: Not Applicable Psych Involvement: No (comment)  Admission diagnosis:  Pericardial effusion  [I31.3] Dyspnea, unspecified type [R06.00] Patient Active Problem List   Diagnosis Date Noted  . Respiratory failure ([place]) 01/04/2019  . Pericardial effusion   . Lobar pneumonia ([place]) 12/27/2018  . Chronic narcotic dependence ([place]) 12/27/2018  . SVT (supraventricular tachycardia) ([place]) 07/06/2018  . Episodic mood disorder ([place]) 04/28/2010  . ALLERGIC RHINITIS 09/15/2008  . GERD 10/24/2006  . Primary osteoarthritis involving multiple joints 10/24/2006  . Osteoporosis 10/24/2006   PCP:  Venia Carbon, MD Pharmacy:   [facility] [place], [place] [address] [place] [address] Phone: [phone] Fax: [phone]  [id number] [place], [place] [address] [place] [address] Phone: [phone] Fax: [place], [place] [address] [place] [address] Phone: [phone] Fax: [phone]     Social Determinants of Health (SDOH) Interventions    Readmission Risk Interventions No flowsheet data found.

## 2019-01-09 NOTE — TOC Progression Note (Addendum)
Transition of Care [facility]) - Progression Note    Patient Details  Name: Bridget Mendez MRN: [id number] Date of Birth: 01-26-1920  Transition of Care [facility]) CM/SW Contact  Ross Ludwig, [place] Phone Number: 01/09/2019, 12:47 PM  Clinical Narrative:     CSW spoke to [place] at [facility] [phone], and they requested PT and OT notes to be faxed to [phone] so they can review patient's notes to see if they can accept her back or if she needs to go to SNF first.  CSW updated Seth Bake at [facility], awaiting for decision from ALF.  1:45pm  CSW was inforemed by Luellen Pucker at ALF, that they would like patient to go to SNF for short term rehab first before returning, since she was just recently discharged from the hospital, and they will have reduced staffing at ALF over the weekend for the holiday.       Expected Discharge Plan and Services                                                 Social Determinants of Health (SDOH) Interventions    Readmission Risk Interventions No flowsheet data found.

## 2019-01-09 NOTE — Progress Notes (Signed)
OT Cancellation Note  Patient Details Name: Bridget Mendez MRN: [id number] DOB: [birth year]   Cancelled Treatment:    Reason Eval/Treat Not Completed: Patient at procedure or test/ unavailable. Per RN, pt out for thoracentesis. Will re-attempt at a later time/date as available and pt medically appropriate for OT tx.   Shara Blazing, M.S., OTR/L Ascom: [phone] 01/09/19, 3:44 PM

## 2019-01-09 NOTE — Plan of Care (Signed)
  Problem: Education: Goal: Knowledge of General Education information will improve Description: Including pain rating scale, medication(s)/side effects and non-pharmacologic comfort measures Outcome: Progressing   Problem: Clinical Measurements: Goal: Respiratory complications will improve Outcome: Progressing   Problem: Activity: Goal: Risk for activity intolerance will decrease Outcome: Progressing   

## 2019-01-09 NOTE — Progress Notes (Signed)
Physical Therapy Treatment Patient Details Name: Bridget Mendez MRN: [id number] DOB: Aug 05, 1919 Today's Date: 01/09/2019    History of Present Illness presented to ER secondary to progressive SOB; admitted for management of pericardial effusion.  s/p pericardiocentesis with drain placement 6/26, drain removed 6/28.    PT Comments    Pt is making good progress towards goals. Appears somewhat confused to situation, unaware of medical concerns and why she is in the hospital. She has many requests and reports PT hasn't worked with her in 4 days. Also has many nursing complaints. Performed active listening and answered all questions using AIDET. Improved progress this session with increased gait distance, O2 sats at 93% at rest, decreased to 91% with exertion while on room air. Pt very eager to return back to ALF, agreeable to update discharge plan. CSW and RN informed. Pt grateful for PT session and pleased at end of treatment. Will continue progress as able.  Follow Up Recommendations  (return back to ALF with HHPT)     Equipment Recommendations  None recommended by PT    Recommendations for Other Services       Precautions / Restrictions Precautions Precautions: Fall Restrictions Weight Bearing Restrictions: No    Mobility  Bed Mobility               General bed mobility comments: not performed, in recliner  Transfers Overall transfer level: Needs assistance Equipment used: Rolling walker (2 wheeled) Transfers: Sit to/from Stand Sit to Stand: Min guard         General transfer comment: safe technique pushing from seated surface. RW used. All mobility on room air  Ambulation/Gait Ambulation/Gait assistance: Min guard;Supervision Gait Distance (Feet): 120 Feet Assistive device: Rolling walker (2 wheeled) Gait Pattern/deviations: Step-through pattern     General Gait Details: reciprocal gait pattern, slow and cautious cadence. Safe technique using RW. Sats taken  with exertion, at 91%. No SOB symptoms, however does fatigue and request to return back to room. HR at 110bpm during exertion.   Stairs             Wheelchair Mobility    Modified Rankin (Stroke Patients Only)       Balance                                            Cognition Arousal/Alertness: Awake/alert Behavior During Therapy: WFL for tasks assessed/performed Overall Cognitive Status: Within Functional Limits for tasks assessed                                        Exercises Other Exercises Other Exercises: seated ther-ex performed including AP, LAQ, alt marching, and mini squats x 10 reps. CGA given    General Comments        Pertinent Vitals/Pain Pain Assessment: No/denies pain    Home Living                      Prior Function            PT Goals (current goals can now be found in the care plan section) Acute Rehab PT Goals Patient Stated Goal: to return home PT Goal Formulation: With patient Time For Goal Achievement: 01/21/19 Potential to Achieve Goals: Good Progress towards PT goals: Progressing  toward goals    Frequency    Min 2X/week      PT Plan Current plan remains appropriate    Co-evaluation              AM-PAC PT "6 Clicks" Mobility   Outcome Measure  Help needed turning from your back to your side while in a flat bed without using bedrails?: A Little Help needed moving from lying on your back to sitting on the side of a flat bed without using bedrails?: A Little Help needed moving to and from a bed to a chair (including a wheelchair)?: A Little Help needed standing up from a chair using your arms (e.g., wheelchair or bedside chair)?: A Little Help needed to walk in hospital room?: A Little Help needed climbing 3-5 steps with a railing? : A Little 6 Click Score: 18    End of Session Equipment Utilized During Treatment: Gait belt Activity Tolerance: Patient tolerated  treatment well Patient left: in chair;with chair alarm set Nurse Communication: Mobility status PT Visit Diagnosis: Difficulty in walking, not elsewhere classified (R26.2)     Time: 4481-8563 PT Time Calculation (min) (ACUTE ONLY): 26 min  Charges:  $Gait Training: 8-22 mins $Therapeutic Exercise: 8-22 mins                     Greggory Stallion, PT, DPT [phone]    Averly Ericson 01/09/2019, 11:50 AM

## 2019-01-09 NOTE — Progress Notes (Signed)
Per Dr. 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  Patient does not want any further treatment for malignancy.  No further work-up.

## 2019-01-09 NOTE — Progress Notes (Addendum)
Progress Note  Patient Name: Bridget Mendez Date of Encounter: 01/09/2019  Primary Cardiologist: Dr. Rockey Situ  Subjective   Reports that she walked with PT " They are trying to get me home" Mild shortness of breath  Inpatient Medications    Scheduled Meds:  acidophilus  1 capsule Oral Daily   cholecalciferol  1,000 Units Oral Daily   feeding supplement (ENSURE ENLIVE)  237 mL Oral BID BM   furosemide  20 mg Oral Daily   metoprolol succinate  100 mg Oral Daily   multivitamin with minerals  1 tablet Oral Daily   pantoprazole  40 mg Oral Daily   Continuous Infusions:  PRN Meds: acetaminophen **OR** acetaminophen, albuterol, ALPRAZolam, calcium carbonate, docusate sodium, HYDROcodone-acetaminophen, nitroGLYCERIN, ondansetron **OR** ondansetron (ZOFRAN) IV, polyethylene glycol, psyllium   Vital Signs    Vitals:   01/08/19 1925 01/09/19 0412 01/09/19 0450 01/09/19 0732  BP: (!) 148/70 136/82  125/80  Pulse: 99 95  99  Resp: 20 20    Temp: 98.2 F (36.8 C) 98.7 F (37.1 C)  98.7 F (37.1 C)  TempSrc: Oral Oral  Oral  SpO2: 94% 90%  91%  Weight:   94.8 kg   Height:        Intake/Output Summary (Last 24 hours) at 01/09/2019 1115 Last data filed at 01/09/2019 0417 Gross per 24 hour  Intake --  Output 800 ml  Net -800 ml   Filed Weights   01/07/19 0500 01/08/19 0347 01/09/19 0450  Weight: 80.4 kg 87.5 kg 94.8 kg    Telemetry    Normal sinus rhythm- Personally Reviewed  ECG    No new tracings - Personally Reviewed  Physical Exam   Constitutional: Alert, some confusion no distress.  HENT:  Head: Grossly normal Eyes:  no discharge. No scleral icterus.  Neck: No JVD, no carotid bruits  Cardiovascular: Regular rate and rhythm, no murmurs appreciated Trace lower extremity edema Pulmonary/Chest: Dullness at the base halfway up on the left, quarter way up on the right Abdominal: Soft.  no distension.  no tenderness.  Musculoskeletal: Normal range of  motion Neurological:  normal muscle tone. Coordination normal. No atrophy Skin: Skin warm and dry Psychiatric: normal affect, pleasant   Labs    Chemistry Recent Labs  Lab 01/04/19 1121 01/05/19 0517 01/07/19 0402 01/09/19 0426  NA 129* 135 135 137  K 5.1 4.4 4.1 4.0  CL 95* 101 103 104  CO2 22 23 22 23   GLUCOSE 121* 141* 114* 110*  BUN 22 19 16 22   CREATININE 1.15* 1.17* 0.93 0.96  CALCIUM 8.1* 8.1* 7.7* 8.0*  PROT 6.7  --   --   --   ALBUMIN 3.0*  --   --   --   AST 26  --   --   --   ALT 30  --   --   --   ALKPHOS 161*  --   --   --   BILITOT 0.6  --   --   --   GFRNONAA 40* 39* 52* 50*  GFRAA 47* 46* >60 58*  ANIONGAP 12 11 10 10      Hematology Recent Labs  Lab 01/04/19 1121 01/06/19 0527 01/07/19 0402  WBC 8.2 9.9 9.5  RBC 3.07* 3.17* 3.14*  HGB 9.7* 9.9* 9.8*  HCT 29.8* 30.2* 30.4*  MCV 97.1 95.3 96.8  MCH 31.6 31.2 31.2  MCHC 32.6 32.8 32.2  RDW 15.0 15.0 15.0  PLT 509* 550* 520*  Cardiac EnzymesNo results for input(s): TROPONINI in the last 168 hours. No results for input(s): TROPIPOC in the last 168 hours.   BNP Recent Labs  Lab 01/04/19 1121  BNP 353.0*     DDimer No results for input(s): DDIMER in the last 168 hours.   Radiology    No results found.  Cardiac Studies   Limited echo 6/29  1. The left ventricle has normal systolic function, with an ejection fraction of 55-60%. The cavity size was normal. There is mild concentric left ventricular hypertrophy. Left ventricular diastolic Doppler parameters are consistent with impaired  relaxation.  2. The right ventricle has normal systolc function. The cavity was normal. There is no increase in right ventricular wall thickness.  3. Moderate pleural effusion in the left lateral region.  4. Small pericardial effusion.  5. The pericardial effusion is posterior to the left ventricle.  6. The tricuspid valve was grossly normal.  7. Moderate thickening of the aortic valve Moderate  calcification of the aortic valve. Aortic valve regurgitation was not assessed by color flow Doppler.  8. Pulmonic valve regurgitation was not assessed by color flow Doppler.   2D Echo 01/04/2019: 1. The left ventricle has normal systolic function with an ejection fraction of 60-65%. The cavity size was normal. There is moderately increased left ventricular wall thickness. Indeterminate diastolic filling due to E-A fusion. 2. The right ventricle has normal systolic function. The cavity was normal. There is no increase in right ventricular wall thickness. Right ventricular systolic pressure is moderately elevated with an estimated pressure of 47.4 mmHg. 3. Left atrial size was moderately dilated. 4. Findings are consistent with cardiac tamponade. 5. Large pericardial effusion. 6. Moderate pleural effusion in the left lateral region. 7. The pericardial effusion is circumferential. 8. There is inversion of the right atrial wall and excessive respiratory variation in the mitral valve spectral Doppler velocities. 9. The mitral valve is degenerative. Mild thickening of the mitral valve leaflet. There is mild mitral annular calcification present. 10. The aortic valve has an indeterminate number of cusps. Mild thickening of the aortic valve. 11. The aortic root is normal in size and structure. 12. The inferior vena cava was normal in size with <50% respiratory variability. 13. The interatrial septum was not well visualized. __________  Pericardiocentesis 01/04/2019: Conclusions: 1. Successful ultrasound and fluoroscopic-guided pericardiocentesis and pericardial drain placement from a subxiphoid approach, yielding 800 mL of blood fluid.  Recommendations: 1. Follow-up fluid analyses. 2. STAT PCXR when patient arrives in the ICU. 3. Repeat limited echocardiogram tomorrow to reassess effusion. __________  2D Echo 01/05/2019: 1. The left ventricle has normal systolic function, with an  ejection fraction of 55-60%. The cavity size was normal. There is mildly increased left ventricular wall thickness. No evidence of left ventricular regional wall motion abnormalities. Septal-lateral dyssynchrony consistent wtih LBBB. 2. The right ventricle has normal systolc function. The cavity was normal. There is no increase in right ventricular wall thickness. 3. Small pericardial effusion, no evidence for tamponade. 4. There was a left-sided pleural effusion. 5. The IVC was normal in size. No complete TR doppler jet so unable to estimate PA systolic pressure. 6. Limited echo to reassess pericardial effusion.  Patient Profile     85 y.o. female with history of PVCs, LBBB, anxiety, depression, lumbar compression fracture, osteoporosis, and GERD, who was admitted with pericardial effusion s/p pericardiocentesis.  Assessment & Plan    Pericardial effusion - S/p pericardiocentesis on 6/26 with removal of pericardial drain 6/28.  -  Limited echo 6/29 as above with EF 55-60%, small pericardial effusion posterior to LV - No further intervention recommended at this time.  Pericardial fluid, malignant cells staining shows CK 7+ adenocarcinoma, markers for lung, breast, lower GI, gyne are negative Likely GI or pancreatic biliary source .  Anemia - Stable hemoglobin ~9.8  Consider iron studies if not already done  Acute hypoxic respiratory distress S/p paracentesis with some improvement of her shortness of breath, still not at baseline -We discussed with medicine service, could consider thoracentesis for moderate size left pleural effusion -Continue Lasix    Total encounter time more than 25 minutes  Greater than 50% was spent in counseling and coordination of care with the patient    For questions or updates, please contact [place] Please consult [email or web address] for contact info under        Signed, Ida Rogue, MD  01/09/2019, 11:15 AM

## 2019-01-09 NOTE — Progress Notes (Signed)
[facility] [place] NAME: Bridget Mendez    MR#:  [id number]  DATE OF BIRTH:  06-09-1920  SUBJECTIVE:   Shortness of breath still present.  Afebrile.  REVIEW OF SYSTEMS:    Review of Systems  Constitutional: Negative for fever, chills weight loss HENT: Negative for ear pain, nosebleeds, congestion, facial swelling, rhinorrhea, neck pain, neck stiffness and ear discharge.   Respiratory: Positive for shortness of breath Cardiovascular: Negative for chest pain, palpitations and leg swelling.  Gastrointestinal: Negative for heartburn, abdominal pain, vomiting, diarrhea or consitpation Genitourinary: Negative for dysuria, urgency, frequency, hematuria Musculoskeletal: Negative for back pain or joint pain Neurological: Negative for dizziness, seizures, syncope, focal weakness,  numbness and headaches.  Hematological: Does not bruise/bleed easily.  Psychiatric/Behavioral: Negative for hallucinations, confusion, dysphoric mood  Tolerating Diet: yes  DRUG ALLERGIES:  No Known Allergies  VITALS:  Blood pressure 125/80, pulse 99, temperature 98.7 F (37.1 C), temperature source Oral, resp. rate 20, height 5\' 3"  (1.6 m), weight 94.8 kg, SpO2 91 %.  PHYSICAL EXAMINATION:  Constitutional: Appears well-developed and well-nourished. No distress. HENT: Normocephalic. Marland Kitchen Oropharynx is clear and moist.  Eyes: Conjunctivae and EOM are normal. PERRLA, no scleral icterus.  Neck: Normal ROM. Neck supple. No JVD. No tracheal deviation. CVS: tachycardic S1/S2 +, no murmurs, no gallops, no carotid bruit.  Pulmonary: Effort and breath sounds normal, no stridor, rhonchi, wheezes, rales.  Abdominal: Soft. BS +,  no distension, tenderness, rebound or guarding.  Musculoskeletal: Normal range of motion. No edema and no tenderness.  Neuro: Alert. CN 2-12 grossly intact. No focal deficits. Skin: Skin is warm and dry. No rash noted. Psychiatric: Normal mood and  affect.   LABORATORY PANEL:   CBC Recent Labs  Lab 01/07/19 0402  WBC 9.5  HGB 9.8*  HCT 30.4*  PLT 520*   ------------------------------------------------------------------------------------------------------------------  Chemistries  Recent Labs  Lab 01/04/19 1121  01/09/19 0426  NA 129*   < > 137  K 5.1   < > 4.0  CL 95*   < > 104  CO2 22   < > 23  GLUCOSE 121*   < > 110*  BUN 22   < > 22  CREATININE 1.15*   < > 0.96  CALCIUM 8.1*   < > 8.0*  AST 26  --   --   ALT 30  --   --   ALKPHOS 161*  --   --   BILITOT 0.6  --   --    < > = values in this interval not displayed.   ------------------------------------------------------------------------------------------------------------------  Cardiac Enzymes No results for input(s): TROPONINI in the last 168 hours. ------------------------------------------------------------------------------------------------------------------  RADIOLOGY:  No results found. ASSESSMENT AND PLAN:   85 year old female with a history of osteoporosis who presented to the ER with shortness of breath and found to have pericardial effusion with tamponade physiology.  *Left pleural effusion.  Ordered ultrasound thoracentesis.  Hopefully patient will feel better with her breathing after the procedure.  1.  Acute hypoxic respiratory failure in the setting of pericardial effusion large with early evidence of tamponade physiology(hemopericardium):  Pericardiocentesis on 01/04/2019.  Drain removed on 01/06/2019. Repeat echo ordered shows improvement Cytology pending from pericardial fluid-discussed with Dr. Reuel Derby of pathology and malignant cells found.  Stains pending Patient weaned off of oxygen. COVID testing negative.  2.  Acute on chronic anemia in the setting of hemopericardium Hgb stable this am  3.  Anxiety: Continue PRN Xanax  4.  History of PVCs: Continue metoprolol  5. Hyponatremia : resolved  6. Bilateral pleural effusions.   Left greater than right Started lasix 20 mg daily  Management plans discussed with the patient and she is in agreement. Discussed with daughter- Cindi over the phone   CODE STATUS: DNR  TOTAL TIME TAKING CARE OF THIS PATIENT: 25 minutes.   POSSIBLE D/C 1-2 days, DEPENDING ON CLINICAL CONDITION.  Leia Alf Macgregor Aeschliman M.D on 01/09/2019 at 2:39 PM  Between 7am to 6pm - Pager - [phone]  After 6pm go to [email or web address] - password [email or web address] [facility]  Office  [phone]  CC: Primary care physician; Venia Carbon, MD  Note: This dictation was prepared with Dragon dictation along with smaller phrase technology. Any transcriptional errors that result from this process are unintentional.

## 2019-01-09 NOTE — Progress Notes (Signed)
Advance care planning  Purpose of Encounter Pericardial effusion  Parties in Attendance Patient, Daughter over the phone  Patients Decisional capacity Alert and oriented.  Able to make medical decisions.  Documented healthcare power of attorney is Bridget Mendez   Discussed in detail regarding pericardial effusion.  We discussed regarding patient's hemopericardium, discussion with pathologist regarding malignant cells and waiting for final stains treatment plan , prognosis discussed.  All questions answered.   Patient mentions that at her age she would not be interested in treatment of malignancy.  She would want what is treatable but not very aggressive.  She is okay with getting a thoracentesis of the left pleural effusion that I have advised.  Daughter agrees. Patient wants to be kept comfortable if things worsen significantly.  At this point she is wanting to be discharged back to [facility].  Also discussed regarding having palliative care follow the patient after discharge.  Discussed in person and separately over the phone again.  CODE STATUS is DNR/DNI  Time spent -50 minutes

## 2019-01-09 NOTE — Progress Notes (Signed)
Patient's daughter Coralyn Mark updated over the phone.

## 2019-01-10 DIAGNOSIS — F329 Major depressive disorder, single episode, unspecified: Secondary | ICD-10-CM | POA: Diagnosis not present

## 2019-01-10 DIAGNOSIS — I7101 Dissection of thoracic aorta: Secondary | ICD-10-CM | POA: Diagnosis not present

## 2019-01-10 DIAGNOSIS — J90 Pleural effusion, not elsewhere classified: Secondary | ICD-10-CM

## 2019-01-10 DIAGNOSIS — R0600 Dyspnea, unspecified: Secondary | ICD-10-CM | POA: Diagnosis not present

## 2019-01-10 DIAGNOSIS — J302 Other seasonal allergic rhinitis: Secondary | ICD-10-CM | POA: Diagnosis not present

## 2019-01-10 DIAGNOSIS — Z48813 Encounter for surgical aftercare following surgery on the respiratory system: Secondary | ICD-10-CM | POA: Diagnosis not present

## 2019-01-10 DIAGNOSIS — F39 Unspecified mood [affective] disorder: Secondary | ICD-10-CM | POA: Diagnosis not present

## 2019-01-10 DIAGNOSIS — F419 Anxiety disorder, unspecified: Secondary | ICD-10-CM | POA: Diagnosis not present

## 2019-01-10 DIAGNOSIS — Z7401 Bed confinement status: Secondary | ICD-10-CM | POA: Diagnosis not present

## 2019-01-10 DIAGNOSIS — J9601 Acute respiratory failure with hypoxia: Secondary | ICD-10-CM | POA: Diagnosis not present

## 2019-01-10 DIAGNOSIS — I493 Ventricular premature depolarization: Secondary | ICD-10-CM | POA: Diagnosis not present

## 2019-01-10 DIAGNOSIS — R0602 Shortness of breath: Secondary | ICD-10-CM | POA: Diagnosis not present

## 2019-01-10 DIAGNOSIS — J9600 Acute respiratory failure, unspecified whether with hypoxia or hypercapnia: Secondary | ICD-10-CM | POA: Diagnosis not present

## 2019-01-10 DIAGNOSIS — M542 Cervicalgia: Secondary | ICD-10-CM | POA: Diagnosis not present

## 2019-01-10 DIAGNOSIS — I313 Pericardial effusion (noninflammatory): Secondary | ICD-10-CM | POA: Diagnosis not present

## 2019-01-10 DIAGNOSIS — J918 Pleural effusion in other conditions classified elsewhere: Secondary | ICD-10-CM | POA: Diagnosis not present

## 2019-01-10 DIAGNOSIS — Z96641 Presence of right artificial hip joint: Secondary | ICD-10-CM | POA: Diagnosis not present

## 2019-01-10 DIAGNOSIS — M810 Age-related osteoporosis without current pathological fracture: Secondary | ICD-10-CM | POA: Diagnosis not present

## 2019-01-10 DIAGNOSIS — C799 Secondary malignant neoplasm of unspecified site: Secondary | ICD-10-CM | POA: Diagnosis not present

## 2019-01-10 DIAGNOSIS — D649 Anemia, unspecified: Secondary | ICD-10-CM | POA: Diagnosis not present

## 2019-01-10 DIAGNOSIS — C801 Malignant (primary) neoplasm, unspecified: Secondary | ICD-10-CM

## 2019-01-10 DIAGNOSIS — M2550 Pain in unspecified joint: Secondary | ICD-10-CM | POA: Diagnosis not present

## 2019-01-10 DIAGNOSIS — Z471 Aftercare following joint replacement surgery: Secondary | ICD-10-CM | POA: Diagnosis not present

## 2019-01-10 MED ORDER — ALPRAZOLAM 0.5 MG PO TABS: 0.5000 mg | ORAL_TABLET | Freq: Two times a day (BID) | ORAL | 0 refills | Status: AC | PRN

## 2019-01-10 MED ORDER — FUROSEMIDE 20 MG PO TABS: 20.0000 mg | ORAL_TABLET | Freq: Every day | ORAL | 0 refills | Status: AC

## 2019-01-10 MED ORDER — METOPROLOL SUCCINATE ER 100 MG PO TB24: 100.0000 mg | ORAL_TABLET | Freq: Every day | ORAL | Status: AC

## 2019-01-10 NOTE — TOC Transition Note (Signed)
Transition of Care [facility]) - CM/SW Discharge Note   Patient Details  Name: Sarahlynn Cisnero MRN: [id number] Date of Birth: 1919-12-15  Transition of Care [facility]) CM/SW Contact:  Madalin Hughart, Lenice Llam[place] Phone Number: [phone]  01/10/2019, 1:46 PM   Clinical Narrative: Patient is medically stable for D/C to [facility] today. Per Seth Bake admissions coordinator at [facility] patient can come today to room 326. RN will call report at [phone] and arrange EMS for transport. Clinical Education officer, museum (CSW) sent D/C orders to [facility] via [place]. Patient is aware of above. Please reconsult if future social work needs arise. CSW signing off.       Final next level of care: Skilled Nursing Facility Barriers to Discharge: Barriers Resolved   Patient Goals and CMS Choice Patient states their goals for this hospitalization and ongoing recovery are:: To go to [facility] then return back to ALF. CMS Medicare.gov Compare Post Acute Care list provided to:: Patient Choice offered to / list presented to : Patient  Discharge Placement   Existing PASRR number confirmed : 01/08/19          Patient chooses bed at: [facility] Patient to be transferred to facility by: [facility] Name of family member notified: Patient's son [name] and daughter Coralyn Mark are aware of D/C today. Patient and family notified of of transfer: 01/10/19  Discharge Plan and Services In-house Referral: Clinical Social Work              DME Arranged: N/A DME Agency: NA     Representative spoke with at DME Agency: na            Social Determinants of Health ([place]) Interventions     Readmission Risk Interventions No flowsheet data found.

## 2019-01-10 NOTE — Care Management Important Message (Signed)
Important Message  Patient Details  Name: Bridget Mendez MRN: [id number] Date of Birth: 09-Feb-1920   Medicare Important Message Given:  Yes     Dannette Barbara 01/10/2019, 1:23 PM

## 2019-01-10 NOTE — Discharge Instructions (Signed)
Resume diet and activity as before ° ° °

## 2019-01-10 NOTE — Progress Notes (Signed)
Progress Note  Patient Name: Bridget Mendez Date of Encounter: 01/10/2019  Primary Cardiologist: Dr. Rockey Situ  Subjective   Denies having any problems with her breathing Eating well this morning, would like to go back to [facility] thoracentesis yesterday, 300 cc out, still with small to moderate sized left pleural effusion remaining  Inpatient Medications    Scheduled Meds: . acidophilus  1 capsule Oral Daily  . cholecalciferol  1,000 Units Oral Daily  . furosemide  20 mg Oral Daily  . metoprolol succinate  100 mg Oral Daily  . multivitamin with minerals  1 tablet Oral Daily  . pantoprazole  40 mg Oral Daily   Continuous Infusions:  PRN Meds: acetaminophen **OR** acetaminophen, albuterol, ALPRAZolam, calcium carbonate, docusate sodium, HYDROcodone-acetaminophen, nitroGLYCERIN, ondansetron **OR** ondansetron (ZOFRAN) IV, polyethylene glycol, psyllium   Vital Signs    Vitals:   01/09/19 1650 01/09/19 1927 01/10/19 0421 01/10/19 0806  BP: 127/72 127/75 135/86 (!) 141/94  Pulse: 94 97 (!) 101 (!) 105  Resp:  18 18 18   Temp: 98.7 F (37.1 C) 98.5 F (36.9 C) 98.4 F (36.9 C) 98.2 F (36.8 C)  TempSrc: Oral Oral  Oral  SpO2: 94% 96% 94% 94%  Weight:   94.2 kg   Height:        Intake/Output Summary (Last 24 hours) at 01/10/2019 1206 Last data filed at 01/10/2019 1000 Gross per 24 hour  Intake 360 ml  Output 1700 ml  Net -1340 ml   Filed Weights   01/08/19 0347 01/09/19 0450 01/10/19 0421  Weight: 87.5 kg 94.8 kg 94.2 kg    Telemetry    Normal sinus rhythm- Personally Reviewed  ECG    No new tracings - Personally Reviewed  Physical Exam   Constitutional:  oriented to person, place, and time. No distress.  HENT:  Head: Normocephalic and atraumatic.  Eyes:  no discharge. No scleral icterus.  Neck: Normal range of motion. Neck supple. No JVD present.  Cardiovascular: Normal rate, regular rhythm, normal heart sounds and intact distal pulses. Exam  reveals no gallop and no friction rub. No edema No murmur heard. Pulmonary/Chest: Effort normal and breath sounds normal. No stridor. No respiratory distress.  no wheezes.  no rales.  no tenderness.  Abdominal: Soft.  no distension.  no tenderness.  Musculoskeletal: Normal range of motion.  no  tenderness or deformity.  Neurological:  normal muscle tone. Coordination normal. No atrophy Skin: Skin is warm and dry. No rash noted. not diaphoretic.  Psychiatric:  normal mood and affect. behavior is normal. Thought content normal.    Labs    Chemistry Recent Labs  Lab 01/04/19 1121 01/05/19 0517 01/07/19 0402 01/09/19 0426  NA 129* 135 135 137  K 5.1 4.4 4.1 4.0  CL 95* 101 103 104  CO2 22 23 22 23   GLUCOSE 121* 141* 114* 110*  BUN 22 19 16 22   CREATININE 1.15* 1.17* 0.93 0.96  CALCIUM 8.1* 8.1* 7.7* 8.0*  PROT 6.7  --   --   --   ALBUMIN 3.0*  --   --   --   AST 26  --   --   --   ALT 30  --   --   --   ALKPHOS 161*  --   --   --   BILITOT 0.6  --   --   --   GFRNONAA 40* 39* 52* 50*  GFRAA 47* 46* >60 58*  ANIONGAP 12 11 10  10     Hematology Recent Labs  Lab 01/04/19 1121 01/06/19 0527 01/07/19 0402  WBC 8.2 9.9 9.5  RBC 3.07* 3.17* 3.14*  HGB 9.7* 9.9* 9.8*  HCT 29.8* 30.2* 30.4*  MCV 97.1 95.3 96.8  MCH 31.6 31.2 31.2  MCHC 32.6 32.8 32.2  RDW 15.0 15.0 15.0  PLT 509* 550* 520*    Cardiac EnzymesNo results for input(s): TROPONINI in the last 168 hours. No results for input(s): TROPIPOC in the last 168 hours.   BNP Recent Labs  Lab 01/04/19 1121  BNP 353.0*     DDimer No results for input(s): DDIMER in the last 168 hours.   Radiology    Dg Chest 1 View  Result Date: 01/09/2019 CLINICAL DATA:  Post thoracentesis. EXAM: CHEST  1 VIEW COMPARISON:  09/07/2018 FINDINGS: The left-sided pleural effusion has decreased since the prior study. There remains a small to moderate-sized left-sided pleural effusion. There is no left-sided pneumothorax. There is a  trace right-sided pleural effusion. There is persistent volume overload. The cardiac silhouette is enlarged. There are aortic calcifications. IMPRESSION: No pneumothorax status post left-sided thoracentesis. There is a persistent small to moderate-sized left-sided pleural effusion. There is a trace right-sided pleural effusion. Electronically Signed   By: Constance Holster M.D.   On: 01/09/2019 16:21   US Thoracentesis Asp Pleural Space W/img Guide  Result Date: 01/09/2019 INDICATION: Left pleural effusion EXAM: ULTRASOUND GUIDED LEFT THORACENTESIS MEDICATIONS: 1% COMPLICATIONS: None immediate. PROCEDURE: An ultrasound guided thoracentesis was thoroughly discussed with the patient and questions answered. The benefits, risks, alternatives and complications were also discussed. The patient understands and wishes to proceed with the procedure. Written consent was obtained. Ultrasound was performed to localize and mark an adequate pocket of fluid in the left chest. The area was then prepped and draped in the normal sterile fashion. 1% Lidocaine was used for local anesthesia. Under ultrasound guidance a 6 Fr Safe-T-Centesis catheter was introduced. Thoracentesis was performed. The catheter was removed and a dressing applied. FINDINGS: A total of approximately 300 cc of bloody pleural fluid was removed. Samples were sent to the laboratory as requested by the clinical team. IMPRESSION: Successful ultrasound guided left thoracentesis yielding 300 cc of pleural fluid. Electronically Signed   By: Jerilynn Mages.  Shick M.D.   On: 01/09/2019 15:57    Cardiac Studies   Limited echo 6/29  1. The left ventricle has normal systolic function, with an ejection fraction of 55-60%. The cavity size was normal. There is mild concentric left ventricular hypertrophy. Left ventricular diastolic Doppler parameters are consistent with impaired  relaxation.  2. The right ventricle has normal systolc function. The cavity was normal. There is no  increase in right ventricular wall thickness.  3. Moderate pleural effusion in the left lateral region.  4. Small pericardial effusion.  5. The pericardial effusion is posterior to the left ventricle.  6. The tricuspid valve was grossly normal.  7. Moderate thickening of the aortic valve Moderate calcification of the aortic valve. Aortic valve regurgitation was not assessed by color flow Doppler.  8. Pulmonic valve regurgitation was not assessed by color flow Doppler.   2D Echo 01/04/2019: 1. The left ventricle has normal systolic function with an ejection fraction of 60-65%. The cavity size was normal. There is moderately increased left ventricular wall thickness. Indeterminate diastolic filling due to E-A fusion. 2. The right ventricle has normal systolic function. The cavity was normal. There is no increase in right ventricular wall thickness. Right ventricular systolic pressure  is moderately elevated with an estimated pressure of 47.4 mmHg. 3. Left atrial size was moderately dilated. 4. Findings are consistent with cardiac tamponade. 5. Large pericardial effusion. 6. Moderate pleural effusion in the left lateral region. 7. The pericardial effusion is circumferential. 8. There is inversion of the right atrial wall and excessive respiratory variation in the mitral valve spectral Doppler velocities. 9. The mitral valve is degenerative. Mild thickening of the mitral valve leaflet. There is mild mitral annular calcification present. 10. The aortic valve has an indeterminate number of cusps. Mild thickening of the aortic valve. 11. The aortic root is normal in size and structure. 12. The inferior vena cava was normal in size with <50% respiratory variability. 13. The interatrial septum was not well visualized. __________  Pericardiocentesis 01/04/2019: Conclusions: 1. Successful ultrasound and fluoroscopic-guided pericardiocentesis and pericardial drain placement from a subxiphoid  approach, yielding 800 mL of blood fluid.  Recommendations: 1. Follow-up fluid analyses. 2. STAT PCXR when patient arrives in the ICU. 3. Repeat limited echocardiogram tomorrow to reassess effusion. __________  2D Echo 01/05/2019: 1. The left ventricle has normal systolic function, with an ejection fraction of 55-60%. The cavity size was normal. There is mildly increased left ventricular wall thickness. No evidence of left ventricular regional wall motion abnormalities. Septal-lateral dyssynchrony consistent wtih LBBB. 2. The right ventricle has normal systolc function. The cavity was normal. There is no increase in right ventricular wall thickness. 3. Small pericardial effusion, no evidence for tamponade. 4. There was a left-sided pleural effusion. 5. The IVC was normal in size. No complete TR doppler jet so unable to estimate PA systolic pressure. 6. Limited echo to reassess pericardial effusion.  Patient Profile     85 y.o. female with history of PVCs, LBBB, anxiety, depression, lumbar compression fracture, osteoporosis, and GERD, who was admitted with pericardial effusion s/p pericardiocentesis.  Assessment & Plan    Pericardial effusion - S/p pericardiocentesis on 6/26 with removal of pericardial drain 6/28.  - Limited echo 6/29 as above with EF 55-60%, small pericardial effusion posterior to LV - No further intervention recommended at this time.  Pericardial fluid, malignant cells staining shows CK 7+ adenocarcinoma, markers for lung, breast, lower GI, gyne are negative Likely GI or pancreatic biliary source . --- We will recommend close outpatient monitoring for reaccumulation of her effusion  Anemia - Stable hemoglobin ~9.8  Consider iron studies if not already done Outpatient monitoring  Acute hypoxic respiratory distress S/p paracentesis with some improvement of her shortness of breath, still not at baseline She had thoracentesis yesterday but only 300 cc out,  small to moderate size left pleural effusion remaining -This can be monitored as an outpatient and for worsening shortness of breath symptoms felt secondary to effusion, could arrange outpatient thoracentesis  Note indicating patient does not want cancer work-up  Total encounter time more than 25 minutes  Greater than 50% was spent in counseling and coordination of care with the patient    For questions or updates, please contact [place] Please consult [email or web address] for contact info under        Signed, Ida Rogue, MD  01/10/2019, 12:06 PM

## 2019-01-10 NOTE — Discharge Summary (Signed)
[facility] [place] NAME: Bridget Mendez    MR#:  [id number]  DATE OF BIRTH:  10/17/1919  DATE OF ADMISSION:  01/04/2019 ADMITTING PHYSICIAN: Nelva Bush, MD  DATE OF DISCHARGE: 01/10/2019  PRIMARY CARE PHYSICIAN: Venia Carbon, MD   ADMISSION DIAGNOSIS:  Pericardial effusion [I31.3] Dyspnea, unspecified type [R06.00]  DISCHARGE DIAGNOSIS:  Active Problems:   Respiratory failure (HCC)   Pericardial effusion   SECONDARY DIAGNOSIS:   Past Medical History:  Diagnosis Date  . Allergy   . Anxiety   . Dental bridge present    permanent, bottom  . Depression   . GERD (gastroesophageal reflux disease)   . Lumbar compression fracture ([place]) 04/2018  . Osteoarthritis   . Osteoporosis   . PVC (premature ventricular contraction)      ADMITTING HISTORY  Bridget Mendez  is a 85 y.o. female with a known history of anxiety, depression, hx compression fracture who presented to the ED with shortness of breath. She was seen in the ED on 12/24/18 and was diagnosed with a pneumonia. She was prescribed Levaquin. She followed up with her PCP on 12/27/18 and her antibiotic course was extended. She was also started on 2L O2 by [place] at home. Her shortness of breath has continued to worsen. She denies any chest pain. She endorses mid back pain. No nausea or vomiting.  In the ED, she was tachypneic and tachycardic. Labs were significant for sodium 129, creatinine 1.15, hgb 9.7. CTA chest showed moderate pericardial effusion and age-indeterminate T7 vertebral body compression fracture. Hospitalists were called for admission.  HOSPITAL COURSE:   85 year old female with a history of osteoporosis who presented to the ER with shortness of breath and found to have pericardial effusion with tamponade physiology.  *Left pleural effusion.  Ordered ultrasound thoracentesis.  300 mL of bloody fluid removed.  Labs pending. Suspicion for malignancy.  * Acute  hypoxic respiratory failure in the setting of pericardial effusion large with early evidence of tamponade physiology(hemopericardium):  Pericardiocentesis on 01/04/2019.  Drain removed on 01/06/2019. Repeat echo ordered shows improvement Patient weaned off of oxygen. COVID testing negative.  Respiratory failure has resolved.  Pericardial fluid cytology showed Per Dr. 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  Patient does not want any further treatment for malignancy.  No further work-up.  * Acute on chronic anemia in the setting of hemopericardium Hgb stable this am  *  Anxiety: Continue PRN Xanax  *  History of PVCs: Continue metoprolol  * Hyponatremia : resolved  * Bilateral pleural effusions.  Left greater than right Started lasix 20 mg daily  Patient will be discharged to skilled nursing facility.  Palliative care to follow.  CONSULTS OBTAINED:  Treatment Team:  Nelva Bush, MD  DRUG ALLERGIES:  No Known Allergies  DISCHARGE MEDICATIONS:   Allergies as of 01/10/2019   No Known Allergies     Medication List    STOP taking these medications   HYDROcodone-acetaminophen 5-325 MG tablet Commonly known as: Norco     TAKE these medications   alendronate 70 MG tablet Commonly known as: FOSAMAX Take 70 mg by mouth once a week. Take with a full glass of water on an empty stomach.   ALPRAZolam 0.5 MG tablet Commonly known as: XANAX Take 1 tablet (0.5 mg total) by mouth 2 (two) times daily as needed  for anxiety. for anxiety What changed:   how much to take  reasons to take this   calcium carbonate 750 MG chewable tablet Commonly known as: TUMS EX Chew 1-2 tablets by mouth daily as needed.   cholecalciferol 25 MCG (1000 UT) tablet Commonly known as: VITAMIN D3 Take 1,000 Units by mouth daily.   furosemide 20 MG  tablet Commonly known as: LASIX Take 1 tablet (20 mg total) by mouth daily.   glucosamine-chondroitin 500-400 MG tablet Take 1 tablet by mouth daily.   methylcellulose oral powder Take 1 packet by mouth daily.   metoprolol succinate 100 MG 24 hr tablet Commonly known as: TOPROL-XL Take 1 tablet (100 mg total) by mouth daily. Take with or immediately following a meal. What changed:   medication strength  how much to take   multivitamin tablet Take 1 tablet by mouth daily.   Probiotic Daily Caps Take 1 capsule by mouth daily.            Durable Medical Equipment  (From admission, onward)         Start     Ordered   01/08/19 1240  For home use only DME oxygen  Once    Question Answer Comment  Length of Need Lifetime   Mode or (Route) Nasal cannula   Liters per Minute 2   Frequency Continuous (stationary and portable oxygen unit needed)   Oxygen delivery system Gas      01/08/19 1239          Today   VITAL SIGNS:  Blood pressure (!) 141/94, pulse (!) 105, temperature 98.2 F (36.8 C), temperature source Oral, resp. rate 18, height 5\' 3"  (1.6 m), weight 94.2 kg, SpO2 94 %.  I/O:    Intake/Output Summary (Last 24 hours) at 01/10/2019 1153 Last data filed at 01/10/2019 1000 Gross per 24 hour  Intake 360 ml  Output 1700 ml  Net -1340 ml    PHYSICAL EXAMINATION:  Physical Exam  GENERAL:  86 y.o.-year-old patient lying in the bed with no acute distress.  LUNGS: Normal breath sounds bilaterally, no wheezing, rales,rhonchi or crepitation. No use of accessory muscles of respiration.  CARDIOVASCULAR: S1, S2 normal. No murmurs, rubs, or gallops.  ABDOMEN: Soft, non-tender, non-distended. Bowel sounds present. No organomegaly or mass.  NEUROLOGIC: Moves all 4 extremities. PSYCHIATRIC: The patient is alert and oriented x 3.  SKIN: No obvious rash, lesion, or ulcer.   DATA REVIEW:   CBC Recent Labs  Lab 01/07/19 0402  WBC 9.5  HGB 9.8*  HCT 30.4*  PLT  520*    Chemistries  Recent Labs  Lab 01/04/19 1121  01/09/19 0426  NA 129*   < > 137  K 5.1   < > 4.0  CL 95*   < > 104  CO2 22   < > 23  GLUCOSE 121*   < > 110*  BUN 22   < > 22  CREATININE 1.15*   < > 0.96  CALCIUM 8.1*   < > 8.0*  AST 26  --   --   ALT 30  --   --   ALKPHOS 161*  --   --   BILITOT 0.6  --   --    < > = values in this interval not displayed.    Cardiac Enzymes No results for input(s): TROPONINI in the last 168 hours.  Microbiology Results  Results for orders placed or performed during the hospital encounter of 01/04/19  SARS Coronavirus 2 (CEPHEID - Performed in [facility] hospital lab), Hosp Order     Status: None   Collection Time: 01/04/19  4:23 PM   Specimen: Nasopharyngeal Swab  Result Value Ref Range Status   SARS Coronavirus 2 NEGATIVE NEGATIVE Final    Comment: (NOTE) If result is NEGATIVE SARS-CoV-2 target nucleic acids are NOT DETECTED. The SARS-CoV-2 RNA is generally detectable in upper and lower  respiratory specimens during the acute phase of infection. The lowest  concentration of SARS-CoV-2 viral copies this assay can detect is 250  copies / mL. A negative result does not preclude SARS-CoV-2 infection  and should not be used as the sole basis for treatment or other  patient management decisions.  A negative result may occur with  improper specimen collection / handling, submission of specimen other  than nasopharyngeal swab, presence of viral mutation(s) within the  areas targeted by this assay, and inadequate number of viral copies  (<250 copies / mL). A negative result must be combined with clinical  observations, patient history, and epidemiological information. If result is POSITIVE SARS-CoV-2 target nucleic acids are DETECTED. The SARS-CoV-2 RNA is generally detectable in upper and lower  respiratory specimens dur ing the acute phase of infection.  Positive  results are indicative of active infection with SARS-CoV-2.   Clinical  correlation with patient history and other diagnostic information is  necessary to determine patient infection status.  Positive results do  not rule out bacterial infection or co-infection with other viruses. If result is PRESUMPTIVE POSTIVE SARS-CoV-2 nucleic acids MAY BE PRESENT.   A presumptive positive result was obtained on the submitted specimen  and confirmed on repeat testing.  While 2019 novel coronavirus  (SARS-CoV-2) nucleic acids may be present in the submitted sample  additional confirmatory testing may be necessary for epidemiological  and / or clinical management purposes  to differentiate between  SARS-CoV-2 and other Sarbecovirus currently known to infect humans.  If clinically indicated additional testing with an alternate test  methodology [phone]) is advised. The SARS-CoV-2 RNA is generally  detectable in upper and lower respiratory sp ecimens during the acute  phase of infection. The expected result is Negative. Fact Sheet for Patients:  StrictlyIdeas.no Fact Sheet for Healthcare Providers: BankingDealers.co.za This test is not yet approved or cleared by the [place] FDA and has been authorized for detection and/or diagnosis of SARS-CoV-2 by FDA under an Emergency Use Authorization (EUA).  This EUA will remain in effect (meaning this test can be used) for the duration of the COVID-19 declaration under Section 564(b)(1) of the Act, 21 U.S.C. section 360bbb-3(b)(1), unless the authorization is terminated or revoked sooner. Performed at [facility], [address] [place], [place] [address]   Body fluid culture     Status: None   Collection Time: 01/04/19  5:45 PM   Specimen: Peritoneal Washings; Body Fluid  Result Value Ref Range Status   Specimen Description   Final    PERITONEAL Performed at [facility], [address] [place], [place] [address]    Special Requests    Final    Normal Performed at [facility], [place], [place] [address]    Gram Stain   Final    ABUNDANT WBC PRESENT,BOTH PMN AND MONONUCLEAR NO ORGANISMS SEEN    Culture   Final    NO GROWTH 3 DAYS Performed at [facility], [place] [address] [place], [place] [address]    Report  Status 01/08/2019 FINAL  Final  MRSA PCR Screening     Status: None   Collection Time: 01/04/19  6:50 PM   Specimen: Nasal Mucosa; Nasopharyngeal  Result Value Ref Range Status   MRSA by PCR NEGATIVE NEGATIVE Final    Comment:        The GeneXpert MRSA Assay (FDA approved for NASAL specimens only), is one component of a comprehensive MRSA colonization surveillance program. It is not intended to diagnose MRSA infection nor to guide or monitor treatment for MRSA infections. Performed at [facility] [address], [place], [place] [address]     RADIOLOGY:  Dg Chest 1 View  Result Date: 01/09/2019 CLINICAL DATA:  Post thoracentesis. EXAM: CHEST  1 VIEW COMPARISON:  09/07/2018 FINDINGS: The left-sided pleural effusion has decreased since the prior study. There remains a small to moderate-sized left-sided pleural effusion. There is no left-sided pneumothorax. There is a trace right-sided pleural effusion. There is persistent volume overload. The cardiac silhouette is enlarged. There are aortic calcifications. IMPRESSION: No pneumothorax status post left-sided thoracentesis. There is a persistent small to moderate-sized left-sided pleural effusion. There is a trace right-sided pleural effusion. Electronically Signed   By: Constance Holster M.D.   On: 01/09/2019 16:21   US Thoracentesis Asp Pleural Space W/img Guide  Result Date: 01/09/2019 INDICATION: Left pleural effusion EXAM: ULTRASOUND GUIDED LEFT THORACENTESIS MEDICATIONS: 1% COMPLICATIONS: None immediate. PROCEDURE: An ultrasound guided thoracentesis was thoroughly discussed with the patient and questions  answered. The benefits, risks, alternatives and complications were also discussed. The patient understands and wishes to proceed with the procedure. Written consent was obtained. Ultrasound was performed to localize and mark an adequate pocket of fluid in the left chest. The area was then prepped and draped in the normal sterile fashion. 1% Lidocaine was used for local anesthesia. Under ultrasound guidance a 6 Fr Safe-T-Centesis catheter was introduced. Thoracentesis was performed. The catheter was removed and a dressing applied. FINDINGS: A total of approximately 300 cc of bloody pleural fluid was removed. Samples were sent to the laboratory as requested by the clinical team. IMPRESSION: Successful ultrasound guided left thoracentesis yielding 300 cc of pleural fluid. Electronically Signed   By: Jerilynn Mages.  Shick M.D.   On: 01/09/2019 15:57    Follow up with PCP in 1 week.  Management plans discussed with the patient, family and they are in agreement.  CODE STATUS:     Code Status Orders  (From admission, onward)         Start     Ordered   01/04/19 1618  Do not attempt resuscitation (DNR)  Continuous    Question Answer Comment  In the event of cardiac or respiratory ARREST Do not call a "code blue"   In the event of cardiac or respiratory ARREST Do not perform Intubation, CPR, defibrillation or ACLS   In the event of cardiac or respiratory ARREST Use medication by any route, position, wound care, and other measures to relive pain and suffering. May use oxygen, suction and manual treatment of airway obstruction as needed for comfort.      01/04/19 1617        Code Status History    Date Active Date Inactive Code Status Order ID Comments User Context   01/04/2019 4132 01/04/2019 1617 Full Code [id number]  Gorden Harms, MD ED   04/24/2018 1535 04/24/2018 2052 Full Code [id number]  Hessie Knows, MD Inpatient   05/31/2017 1338 06/01/2017 1832 Full Code [id number]  Dustin Flock,  MD ED    07/29/2014 1813 07/31/2014 1928 Full Code [id number]  Rich Fuchs, PA-C Inpatient   Advance Care Planning Activity    Advance Directive Documentation     Most Recent Value  Type of Advance Directive  Out of facility DNR (pink MOST or yellow form)  Pre-existing out of facility DNR order (yellow form or pink MOST form)  Yellow form placed in chart (order not valid for inpatient use)  "MOST" Form in Place?  -      TOTAL TIME TAKING CARE OF THIS PATIENT ON DAY OF DISCHARGE: more than 30 minutes.   Leia Alf Jadier Rockers M.D on 01/10/2019 at 11:53 AM  Between 7am to 6pm - Pager - [phone]  After 6pm go to [email or web address] - password [email or web address] [facility]  Office  [phone]  CC: Primary care physician; Venia Carbon, MD  Note: This dictation was prepared with Dragon dictation along with smaller phrase technology. Any transcriptional errors that result from this process are unintentional.

## 2019-01-10 NOTE — Progress Notes (Signed)
Nutrition Follow Up Note  RD working remotely.  DOCUMENTATION CODES:   Not applicable  INTERVENTION:   MVI daily  Liberalize diet   NUTRITION DIAGNOSIS:   Inadequate oral intake related to decreased appetite as evidenced by meal completion < 50%.  GOAL:   Patient will meet greater than or equal to 90% of their needs  -progressing   MONITOR:   PO intake, Labs, Weight trends, I & O's  ASSESSMENT:   85 year old female with PMHx of GERD, OA, OP, depression, anxiety, lumbar compression fracture admitted with acute hypoxic respiratory failure in setting of pericardial effusion with early evidence of tamponade physiology s/p pericardiocentesis with drain placement, also with acute on chronic anemia.   Pt with fair appetite and oral intake in hospital. Pt eating ~60% of meals in hospital. Pt refusing all of the Ensure; RD will discontinue. RD will liberalize diet to try and encourage increased po intake as pt not likely eating enough to exceed any nutrient limits. Per chart, pt up ~42lbs since admit; RD unsure if bed weights are correct. Pt to discharge to SNF when ready.   Medications reviewed and include: risaquad, D3, lasix, MVI, protonix  Labs reviewed:   Diet Order:   Diet Order            Diet Heart Room service appropriate? Yes; Fluid consistency: Thin  Diet effective now             EDUCATION NEEDS:   No education needs have been identified at this time  Skin:  Skin Assessment: Reviewed RN Assessment  Last BM:  01/06/2019 per chart  Height:   Ht Readings from Last 1 Encounters:  01/04/19 5\' 3"  (1.6 m)   Weight:   Wt Readings from Last 1 Encounters:  01/10/19 94.2 kg   Ideal Body Weight:  52.3 kg  BMI:  Body mass index is 36.79 kg/m.  Estimated Nutritional Needs:   Kcal:  1400-1600  Protein:  70-80 grams  Fluid:  1.4-1.6 L/day  Koleen Distance MS, RD, LDN Pager #- [phone] Office#- [phone] After Hours Pager: [phone]

## 2019-01-14 DIAGNOSIS — J918 Pleural effusion in other conditions classified elsewhere: Secondary | ICD-10-CM | POA: Diagnosis not present

## 2019-01-14 DIAGNOSIS — C799 Secondary malignant neoplasm of unspecified site: Secondary | ICD-10-CM | POA: Diagnosis not present

## 2019-01-14 DIAGNOSIS — I313 Pericardial effusion (noninflammatory): Secondary | ICD-10-CM | POA: Diagnosis not present

## 2019-01-14 DIAGNOSIS — F39 Unspecified mood [affective] disorder: Secondary | ICD-10-CM | POA: Diagnosis not present

## 2019-01-16 ENCOUNTER — Telehealth: Payer: Self-pay | Admitting: *Deleted

## 2019-01-16 LAB — CYTOLOGY - NON PAP

## 2019-01-16 NOTE — Telephone Encounter (Signed)
Gave order to the nurse at [facility] to take away the no added salt order. I will discuss this with her tomorrow when I am there

## 2019-01-16 NOTE — Telephone Encounter (Signed)
Patient left a voicemail stating that she is in healthcare at [facility]. Patient stated that she demands and wants salt on her food. Patient stated that she wants Dr.Letvak to notify [facility] that she is to have salt on her food.

## 2019-01-17 ENCOUNTER — Other Ambulatory Visit: Payer: Medicare Other

## 2019-01-17 DIAGNOSIS — I7101 Dissection of thoracic aorta: Secondary | ICD-10-CM | POA: Diagnosis not present

## 2019-01-17 NOTE — Progress Notes (Signed)
Tumor Board Documentation  Bridget Mendez was presented by Dr Weber Cooks at our Tumor Board on 01/17/2019, which included representatives from medical oncology, radiation oncology, pathology, radiology, surgical, navigation, internal medicine, palliative care, research.  Bridget Mendez currently presents for discussion, for Bridget Mendez with history of the following treatments: active survellience, surgical intervention(s).  Additionally, we reviewed previous medical and familial history, history of present illness, and recent lab results along with all available histopathologic and imaging studies. The tumor board considered available treatment options and made the following recommendations:   Patient refuses any treatment  The following procedures/referrals were also placed: No orders of the defined types were placed in this encounter.   Clinical Trial Status: not discussed   Staging used:    AJCC Staging:       Group: [place] carcinoma of pericardial fluid   National site-specific guidelines   were discussed with respect to the case.  Tumor board is a meeting of clinicians from various specialty areas who evaluate and discuss patients for whom a multidisciplinary approach is being considered. Final determinations in the plan of care are those of the provider(s). The responsibility for follow up of recommendations given during tumor board is that of the provider.   Today's extended care, comprehensive team conference, Bridget Mendez was not present for the discussion and was not examined.   Multidisciplinary Tumor Board is a multidisciplinary case peer review process.  Decisions discussed in the Multidisciplinary Tumor Board reflect the opinions of the specialists present at the conference without having examined the patient.  Ultimately, treatment and diagnostic decisions rest with the primary provider(s) and the patient.

## 2019-01-25 DIAGNOSIS — R2681 Unsteadiness on feet: Secondary | ICD-10-CM | POA: Diagnosis not present

## 2019-01-25 DIAGNOSIS — I313 Pericardial effusion (noninflammatory): Secondary | ICD-10-CM | POA: Diagnosis not present

## 2019-01-25 DIAGNOSIS — J90 Pleural effusion, not elsewhere classified: Secondary | ICD-10-CM | POA: Diagnosis not present

## 2019-01-25 DIAGNOSIS — B351 Tinea unguium: Secondary | ICD-10-CM | POA: Diagnosis not present

## 2019-01-28 DIAGNOSIS — I313 Pericardial effusion (noninflammatory): Secondary | ICD-10-CM | POA: Diagnosis not present

## 2019-01-28 DIAGNOSIS — R2681 Unsteadiness on feet: Secondary | ICD-10-CM | POA: Diagnosis not present

## 2019-01-28 DIAGNOSIS — J90 Pleural effusion, not elsewhere classified: Secondary | ICD-10-CM | POA: Diagnosis not present

## 2019-01-29 DIAGNOSIS — J90 Pleural effusion, not elsewhere classified: Secondary | ICD-10-CM | POA: Diagnosis not present

## 2019-01-29 DIAGNOSIS — I313 Pericardial effusion (noninflammatory): Secondary | ICD-10-CM | POA: Diagnosis not present

## 2019-01-29 DIAGNOSIS — R2681 Unsteadiness on feet: Secondary | ICD-10-CM | POA: Diagnosis not present

## 2019-01-30 DIAGNOSIS — I313 Pericardial effusion (noninflammatory): Secondary | ICD-10-CM | POA: Diagnosis not present

## 2019-01-30 DIAGNOSIS — R2681 Unsteadiness on feet: Secondary | ICD-10-CM | POA: Diagnosis not present

## 2019-01-30 DIAGNOSIS — J90 Pleural effusion, not elsewhere classified: Secondary | ICD-10-CM | POA: Diagnosis not present

## 2019-01-31 DIAGNOSIS — J90 Pleural effusion, not elsewhere classified: Secondary | ICD-10-CM | POA: Diagnosis not present

## 2019-01-31 DIAGNOSIS — R2681 Unsteadiness on feet: Secondary | ICD-10-CM | POA: Diagnosis not present

## 2019-01-31 DIAGNOSIS — I313 Pericardial effusion (noninflammatory): Secondary | ICD-10-CM | POA: Diagnosis not present

## 2019-02-01 DIAGNOSIS — Z03818 Encounter for observation for suspected exposure to other biological agents ruled out: Secondary | ICD-10-CM | POA: Diagnosis not present

## 2019-02-04 DIAGNOSIS — R2681 Unsteadiness on feet: Secondary | ICD-10-CM | POA: Diagnosis not present

## 2019-02-04 DIAGNOSIS — I313 Pericardial effusion (noninflammatory): Secondary | ICD-10-CM | POA: Diagnosis not present

## 2019-02-04 DIAGNOSIS — J90 Pleural effusion, not elsewhere classified: Secondary | ICD-10-CM | POA: Diagnosis not present

## 2019-02-05 DIAGNOSIS — I313 Pericardial effusion (noninflammatory): Secondary | ICD-10-CM | POA: Diagnosis not present

## 2019-02-05 DIAGNOSIS — J90 Pleural effusion, not elsewhere classified: Secondary | ICD-10-CM | POA: Diagnosis not present

## 2019-02-05 DIAGNOSIS — R2681 Unsteadiness on feet: Secondary | ICD-10-CM | POA: Diagnosis not present

## 2019-02-05 DIAGNOSIS — B342 Coronavirus infection, unspecified: Secondary | ICD-10-CM | POA: Diagnosis not present

## 2019-02-06 DIAGNOSIS — J90 Pleural effusion, not elsewhere classified: Secondary | ICD-10-CM | POA: Diagnosis not present

## 2019-02-06 DIAGNOSIS — I313 Pericardial effusion (noninflammatory): Secondary | ICD-10-CM | POA: Diagnosis not present

## 2019-02-06 DIAGNOSIS — R2681 Unsteadiness on feet: Secondary | ICD-10-CM | POA: Diagnosis not present

## 2019-02-07 DIAGNOSIS — R2681 Unsteadiness on feet: Secondary | ICD-10-CM | POA: Diagnosis not present

## 2019-02-07 DIAGNOSIS — J90 Pleural effusion, not elsewhere classified: Secondary | ICD-10-CM | POA: Diagnosis not present

## 2019-02-07 DIAGNOSIS — I313 Pericardial effusion (noninflammatory): Secondary | ICD-10-CM | POA: Diagnosis not present

## 2019-02-12 DIAGNOSIS — J90 Pleural effusion, not elsewhere classified: Secondary | ICD-10-CM | POA: Diagnosis not present

## 2019-02-12 DIAGNOSIS — I313 Pericardial effusion (noninflammatory): Secondary | ICD-10-CM | POA: Diagnosis not present

## 2019-02-12 DIAGNOSIS — R2681 Unsteadiness on feet: Secondary | ICD-10-CM | POA: Diagnosis not present

## 2019-02-13 DIAGNOSIS — J90 Pleural effusion, not elsewhere classified: Secondary | ICD-10-CM | POA: Diagnosis not present

## 2019-02-13 DIAGNOSIS — I313 Pericardial effusion (noninflammatory): Secondary | ICD-10-CM | POA: Diagnosis not present

## 2019-02-13 DIAGNOSIS — R2681 Unsteadiness on feet: Secondary | ICD-10-CM | POA: Diagnosis not present

## 2019-02-15 DIAGNOSIS — J90 Pleural effusion, not elsewhere classified: Secondary | ICD-10-CM | POA: Diagnosis not present

## 2019-02-15 DIAGNOSIS — I313 Pericardial effusion (noninflammatory): Secondary | ICD-10-CM | POA: Diagnosis not present

## 2019-02-15 DIAGNOSIS — R2681 Unsteadiness on feet: Secondary | ICD-10-CM | POA: Diagnosis not present

## 2019-02-18 DIAGNOSIS — R2681 Unsteadiness on feet: Secondary | ICD-10-CM | POA: Diagnosis not present

## 2019-02-18 DIAGNOSIS — J90 Pleural effusion, not elsewhere classified: Secondary | ICD-10-CM | POA: Diagnosis not present

## 2019-02-18 DIAGNOSIS — I313 Pericardial effusion (noninflammatory): Secondary | ICD-10-CM | POA: Diagnosis not present

## 2019-02-19 DIAGNOSIS — I471 Supraventricular tachycardia: Secondary | ICD-10-CM

## 2019-02-19 DIAGNOSIS — J918 Pleural effusion in other conditions classified elsewhere: Secondary | ICD-10-CM

## 2019-02-19 DIAGNOSIS — C799 Secondary malignant neoplasm of unspecified site: Secondary | ICD-10-CM

## 2019-02-19 DIAGNOSIS — F39 Unspecified mood [affective] disorder: Secondary | ICD-10-CM

## 2019-02-20 DIAGNOSIS — R2681 Unsteadiness on feet: Secondary | ICD-10-CM | POA: Diagnosis not present

## 2019-02-20 DIAGNOSIS — J90 Pleural effusion, not elsewhere classified: Secondary | ICD-10-CM | POA: Diagnosis not present

## 2019-02-20 DIAGNOSIS — I313 Pericardial effusion (noninflammatory): Secondary | ICD-10-CM | POA: Diagnosis not present

## 2019-02-22 DIAGNOSIS — R2681 Unsteadiness on feet: Secondary | ICD-10-CM | POA: Diagnosis not present

## 2019-02-22 DIAGNOSIS — I313 Pericardial effusion (noninflammatory): Secondary | ICD-10-CM | POA: Diagnosis not present

## 2019-02-22 DIAGNOSIS — J90 Pleural effusion, not elsewhere classified: Secondary | ICD-10-CM | POA: Diagnosis not present

## 2019-02-25 DIAGNOSIS — I313 Pericardial effusion (noninflammatory): Secondary | ICD-10-CM | POA: Diagnosis not present

## 2019-02-25 DIAGNOSIS — R2681 Unsteadiness on feet: Secondary | ICD-10-CM | POA: Diagnosis not present

## 2019-02-25 DIAGNOSIS — J90 Pleural effusion, not elsewhere classified: Secondary | ICD-10-CM | POA: Diagnosis not present

## 2019-02-27 DIAGNOSIS — I313 Pericardial effusion (noninflammatory): Secondary | ICD-10-CM | POA: Diagnosis not present

## 2019-02-27 DIAGNOSIS — J90 Pleural effusion, not elsewhere classified: Secondary | ICD-10-CM | POA: Diagnosis not present

## 2019-02-27 DIAGNOSIS — R2681 Unsteadiness on feet: Secondary | ICD-10-CM | POA: Diagnosis not present

## 2019-03-01 DIAGNOSIS — I313 Pericardial effusion (noninflammatory): Secondary | ICD-10-CM | POA: Diagnosis not present

## 2019-03-01 DIAGNOSIS — R2681 Unsteadiness on feet: Secondary | ICD-10-CM | POA: Diagnosis not present

## 2019-03-01 DIAGNOSIS — J90 Pleural effusion, not elsewhere classified: Secondary | ICD-10-CM | POA: Diagnosis not present

## 2019-03-05 DIAGNOSIS — I313 Pericardial effusion (noninflammatory): Secondary | ICD-10-CM | POA: Diagnosis not present

## 2019-03-05 DIAGNOSIS — J90 Pleural effusion, not elsewhere classified: Secondary | ICD-10-CM | POA: Diagnosis not present

## 2019-03-05 DIAGNOSIS — R2681 Unsteadiness on feet: Secondary | ICD-10-CM | POA: Diagnosis not present

## 2019-03-06 DIAGNOSIS — I313 Pericardial effusion (noninflammatory): Secondary | ICD-10-CM | POA: Diagnosis not present

## 2019-03-06 DIAGNOSIS — R2681 Unsteadiness on feet: Secondary | ICD-10-CM | POA: Diagnosis not present

## 2019-03-06 DIAGNOSIS — J90 Pleural effusion, not elsewhere classified: Secondary | ICD-10-CM | POA: Diagnosis not present

## 2019-03-14 DIAGNOSIS — Z03818 Encounter for observation for suspected exposure to other biological agents ruled out: Secondary | ICD-10-CM | POA: Diagnosis not present

## 2019-03-20 DIAGNOSIS — Z03818 Encounter for observation for suspected exposure to other biological agents ruled out: Secondary | ICD-10-CM | POA: Diagnosis not present

## 2019-03-29 DIAGNOSIS — F39 Unspecified mood [affective] disorder: Secondary | ICD-10-CM | POA: Diagnosis not present

## 2019-04-12 DIAGNOSIS — Z03818 Encounter for observation for suspected exposure to other biological agents ruled out: Secondary | ICD-10-CM | POA: Diagnosis not present

## 2019-04-23 DIAGNOSIS — Z03818 Encounter for observation for suspected exposure to other biological agents ruled out: Secondary | ICD-10-CM | POA: Diagnosis not present

## 2019-04-26 DIAGNOSIS — Z03818 Encounter for observation for suspected exposure to other biological agents ruled out: Secondary | ICD-10-CM | POA: Diagnosis not present

## 2019-04-29 DIAGNOSIS — M25551 Pain in right hip: Secondary | ICD-10-CM | POA: Diagnosis not present

## 2019-04-29 DIAGNOSIS — I471 Supraventricular tachycardia: Secondary | ICD-10-CM | POA: Diagnosis not present

## 2019-04-29 DIAGNOSIS — F39 Unspecified mood [affective] disorder: Secondary | ICD-10-CM | POA: Diagnosis not present

## 2019-04-29 DIAGNOSIS — C799 Secondary malignant neoplasm of unspecified site: Secondary | ICD-10-CM | POA: Diagnosis not present

## 2019-04-29 DIAGNOSIS — J918 Pleural effusion in other conditions classified elsewhere: Secondary | ICD-10-CM | POA: Diagnosis not present

## 2019-06-12 DIAGNOSIS — C799 Secondary malignant neoplasm of unspecified site: Secondary | ICD-10-CM | POA: Diagnosis not present

## 2019-06-12 DIAGNOSIS — F39 Unspecified mood [affective] disorder: Secondary | ICD-10-CM | POA: Diagnosis not present

## 2019-06-12 DIAGNOSIS — I471 Supraventricular tachycardia: Secondary | ICD-10-CM

## 2019-06-12 DIAGNOSIS — J910 Malignant pleural effusion: Secondary | ICD-10-CM | POA: Diagnosis not present

## 2019-06-12 DIAGNOSIS — M25551 Pain in right hip: Secondary | ICD-10-CM | POA: Diagnosis not present

## 2019-06-14 DIAGNOSIS — B351 Tinea unguium: Secondary | ICD-10-CM | POA: Diagnosis not present

## 2019-07-22 DIAGNOSIS — Z23 Encounter for immunization: Secondary | ICD-10-CM | POA: Diagnosis not present

## 2019-08-12 DIAGNOSIS — M159 Polyosteoarthritis, unspecified: Secondary | ICD-10-CM | POA: Diagnosis not present

## 2019-08-12 DIAGNOSIS — I471 Supraventricular tachycardia: Secondary | ICD-10-CM | POA: Diagnosis not present

## 2019-08-12 DIAGNOSIS — F39 Unspecified mood [affective] disorder: Secondary | ICD-10-CM | POA: Diagnosis not present

## 2019-08-12 DIAGNOSIS — C799 Secondary malignant neoplasm of unspecified site: Secondary | ICD-10-CM | POA: Diagnosis not present

## 2019-08-19 DIAGNOSIS — Z23 Encounter for immunization: Secondary | ICD-10-CM | POA: Diagnosis not present

## 2019-08-28 DIAGNOSIS — B351 Tinea unguium: Secondary | ICD-10-CM | POA: Diagnosis not present

## 2019-11-29 DIAGNOSIS — C799 Secondary malignant neoplasm of unspecified site: Secondary | ICD-10-CM | POA: Diagnosis not present

## 2019-11-29 DIAGNOSIS — M1990 Unspecified osteoarthritis, unspecified site: Secondary | ICD-10-CM | POA: Diagnosis not present

## 2019-11-29 DIAGNOSIS — I471 Supraventricular tachycardia: Secondary | ICD-10-CM | POA: Diagnosis not present

## 2019-11-29 DIAGNOSIS — F39 Unspecified mood [affective] disorder: Secondary | ICD-10-CM | POA: Diagnosis not present

## 2019-11-29 DIAGNOSIS — I10 Essential (primary) hypertension: Secondary | ICD-10-CM | POA: Diagnosis not present

## 2019-12-17 DIAGNOSIS — M159 Polyosteoarthritis, unspecified: Secondary | ICD-10-CM | POA: Diagnosis not present

## 2019-12-17 DIAGNOSIS — I471 Supraventricular tachycardia: Secondary | ICD-10-CM | POA: Diagnosis not present

## 2019-12-17 DIAGNOSIS — C799 Secondary malignant neoplasm of unspecified site: Secondary | ICD-10-CM | POA: Diagnosis not present

## 2019-12-17 DIAGNOSIS — F39 Unspecified mood [affective] disorder: Secondary | ICD-10-CM | POA: Diagnosis not present

## 2020-02-12 DIAGNOSIS — I10 Essential (primary) hypertension: Secondary | ICD-10-CM | POA: Diagnosis not present

## 2020-02-12 DIAGNOSIS — F39 Unspecified mood [affective] disorder: Secondary | ICD-10-CM | POA: Diagnosis not present

## 2020-02-12 DIAGNOSIS — M1990 Unspecified osteoarthritis, unspecified site: Secondary | ICD-10-CM | POA: Diagnosis not present

## 2020-02-19 ENCOUNTER — Other Ambulatory Visit: Payer: Self-pay

## 2020-02-19 ENCOUNTER — Encounter: Payer: Self-pay | Admitting: Podiatry

## 2020-02-19 ENCOUNTER — Ambulatory Visit (INDEPENDENT_AMBULATORY_CARE_PROVIDER_SITE_OTHER): Payer: Medicare Other | Admitting: Podiatry

## 2020-02-19 DIAGNOSIS — M2042 Other hammer toe(s) (acquired), left foot: Secondary | ICD-10-CM | POA: Diagnosis not present

## 2020-02-19 DIAGNOSIS — B351 Tinea unguium: Secondary | ICD-10-CM

## 2020-02-19 DIAGNOSIS — M79676 Pain in unspecified toe(s): Secondary | ICD-10-CM | POA: Diagnosis not present

## 2020-02-19 NOTE — Progress Notes (Signed)
She presents today with a concern of a painful hammertoe second digit left foot.  She also complaining of painfully elongated toenails.  Objective: Vital signs are stable she is alert oriented x3 there is no erythema edema cellulitis drainage or odor hammertoe deformity with medial deviation second left rigid in nature.  No open lesions or wounds.  Toenails are long thick yellow dystrophic-like mycotic painful.  Assessment: Painful hammertoe deformity second left.  Painful mycotic nails.  Plan: Debridement of nails 1 through 5 bilateral placed padding to the toe she will follow up with any concerns

## 2020-04-13 DIAGNOSIS — C799 Secondary malignant neoplasm of unspecified site: Secondary | ICD-10-CM | POA: Diagnosis not present

## 2020-04-13 DIAGNOSIS — F39 Unspecified mood [affective] disorder: Secondary | ICD-10-CM | POA: Diagnosis not present

## 2020-04-13 DIAGNOSIS — M159 Polyosteoarthritis, unspecified: Secondary | ICD-10-CM | POA: Diagnosis not present

## 2020-04-13 DIAGNOSIS — I471 Supraventricular tachycardia: Secondary | ICD-10-CM | POA: Diagnosis not present

## 2020-04-24 DIAGNOSIS — B351 Tinea unguium: Secondary | ICD-10-CM | POA: Diagnosis not present

## 2020-05-22 DIAGNOSIS — Z23 Encounter for immunization: Secondary | ICD-10-CM | POA: Diagnosis not present

## 2020-06-11 DIAGNOSIS — R609 Edema, unspecified: Secondary | ICD-10-CM | POA: Diagnosis not present

## 2020-06-11 DIAGNOSIS — R059 Cough, unspecified: Secondary | ICD-10-CM | POA: Diagnosis not present

## 2020-06-16 DIAGNOSIS — N390 Urinary tract infection, site not specified: Secondary | ICD-10-CM | POA: Diagnosis not present

## 2020-06-16 DIAGNOSIS — R609 Edema, unspecified: Secondary | ICD-10-CM

## 2020-06-16 DIAGNOSIS — N1832 Chronic kidney disease, stage 3b: Secondary | ICD-10-CM

## 2020-06-17 DIAGNOSIS — C799 Secondary malignant neoplasm of unspecified site: Secondary | ICD-10-CM | POA: Diagnosis not present

## 2020-06-17 DIAGNOSIS — R0602 Shortness of breath: Secondary | ICD-10-CM | POA: Diagnosis not present

## 2020-06-17 DIAGNOSIS — I5020 Unspecified systolic (congestive) heart failure: Secondary | ICD-10-CM | POA: Diagnosis not present

## 2020-06-17 DIAGNOSIS — I471 Supraventricular tachycardia: Secondary | ICD-10-CM | POA: Diagnosis not present

## 2020-06-17 DIAGNOSIS — F39 Unspecified mood [affective] disorder: Secondary | ICD-10-CM | POA: Diagnosis not present

## 2020-06-17 DIAGNOSIS — M1990 Unspecified osteoarthritis, unspecified site: Secondary | ICD-10-CM | POA: Diagnosis not present

## 2020-06-17 DIAGNOSIS — I10 Essential (primary) hypertension: Secondary | ICD-10-CM | POA: Diagnosis not present

## 2020-06-18 DIAGNOSIS — I82491 Acute embolism and thrombosis of other specified deep vein of right lower extremity: Secondary | ICD-10-CM | POA: Diagnosis not present

## 2020-06-22 DIAGNOSIS — I10 Essential (primary) hypertension: Secondary | ICD-10-CM | POA: Diagnosis not present

## 2020-06-23 DIAGNOSIS — N184 Chronic kidney disease, stage 4 (severe): Secondary | ICD-10-CM | POA: Diagnosis not present

## 2020-06-25 DIAGNOSIS — I871 Compression of vein: Secondary | ICD-10-CM | POA: Diagnosis not present

## 2020-06-29 DIAGNOSIS — N184 Chronic kidney disease, stage 4 (severe): Secondary | ICD-10-CM | POA: Diagnosis not present

## 2020-07-08 DIAGNOSIS — B351 Tinea unguium: Secondary | ICD-10-CM | POA: Diagnosis not present

## 2020-08-07 DIAGNOSIS — J90 Pleural effusion, not elsewhere classified: Secondary | ICD-10-CM | POA: Diagnosis not present

## 2020-08-07 DIAGNOSIS — R2681 Unsteadiness on feet: Secondary | ICD-10-CM | POA: Diagnosis not present

## 2020-08-07 DIAGNOSIS — C801 Malignant (primary) neoplasm, unspecified: Secondary | ICD-10-CM | POA: Diagnosis not present

## 2020-08-07 DIAGNOSIS — M1990 Unspecified osteoarthritis, unspecified site: Secondary | ICD-10-CM | POA: Diagnosis not present

## 2020-08-10 DIAGNOSIS — M1990 Unspecified osteoarthritis, unspecified site: Secondary | ICD-10-CM | POA: Diagnosis not present

## 2020-08-10 DIAGNOSIS — R2681 Unsteadiness on feet: Secondary | ICD-10-CM | POA: Diagnosis not present

## 2020-08-10 DIAGNOSIS — R0602 Shortness of breath: Secondary | ICD-10-CM | POA: Diagnosis not present

## 2020-08-10 DIAGNOSIS — I5032 Chronic diastolic (congestive) heart failure: Secondary | ICD-10-CM | POA: Diagnosis not present

## 2020-08-10 DIAGNOSIS — J90 Pleural effusion, not elsewhere classified: Secondary | ICD-10-CM | POA: Diagnosis not present

## 2020-08-10 DIAGNOSIS — R0989 Other specified symptoms and signs involving the circulatory and respiratory systems: Secondary | ICD-10-CM | POA: Diagnosis not present

## 2020-08-10 DIAGNOSIS — C801 Malignant (primary) neoplasm, unspecified: Secondary | ICD-10-CM | POA: Diagnosis not present

## 2020-08-10 DIAGNOSIS — C799 Secondary malignant neoplasm of unspecified site: Secondary | ICD-10-CM | POA: Diagnosis not present

## 2020-08-10 DIAGNOSIS — H40003 Preglaucoma, unspecified, bilateral: Secondary | ICD-10-CM | POA: Diagnosis not present

## 2020-08-10 DIAGNOSIS — N184 Chronic kidney disease, stage 4 (severe): Secondary | ICD-10-CM | POA: Diagnosis not present

## 2020-08-12 DIAGNOSIS — C801 Malignant (primary) neoplasm, unspecified: Secondary | ICD-10-CM | POA: Diagnosis not present

## 2020-08-12 DIAGNOSIS — J90 Pleural effusion, not elsewhere classified: Secondary | ICD-10-CM | POA: Diagnosis not present

## 2020-08-12 DIAGNOSIS — M1990 Unspecified osteoarthritis, unspecified site: Secondary | ICD-10-CM | POA: Diagnosis not present

## 2020-08-12 DIAGNOSIS — R2681 Unsteadiness on feet: Secondary | ICD-10-CM | POA: Diagnosis not present

## 2020-08-13 DIAGNOSIS — M1990 Unspecified osteoarthritis, unspecified site: Secondary | ICD-10-CM | POA: Diagnosis not present

## 2020-08-13 DIAGNOSIS — C801 Malignant (primary) neoplasm, unspecified: Secondary | ICD-10-CM | POA: Diagnosis not present

## 2020-08-13 DIAGNOSIS — R2681 Unsteadiness on feet: Secondary | ICD-10-CM | POA: Diagnosis not present

## 2020-08-13 DIAGNOSIS — J90 Pleural effusion, not elsewhere classified: Secondary | ICD-10-CM | POA: Diagnosis not present

## 2020-08-14 DIAGNOSIS — J90 Pleural effusion, not elsewhere classified: Secondary | ICD-10-CM | POA: Diagnosis not present

## 2020-08-14 DIAGNOSIS — C801 Malignant (primary) neoplasm, unspecified: Secondary | ICD-10-CM | POA: Diagnosis not present

## 2020-08-14 DIAGNOSIS — M1990 Unspecified osteoarthritis, unspecified site: Secondary | ICD-10-CM | POA: Diagnosis not present

## 2020-08-14 DIAGNOSIS — R2681 Unsteadiness on feet: Secondary | ICD-10-CM | POA: Diagnosis not present

## 2020-08-17 DIAGNOSIS — M1990 Unspecified osteoarthritis, unspecified site: Secondary | ICD-10-CM | POA: Diagnosis not present

## 2020-08-17 DIAGNOSIS — R2681 Unsteadiness on feet: Secondary | ICD-10-CM | POA: Diagnosis not present

## 2020-08-17 DIAGNOSIS — C801 Malignant (primary) neoplasm, unspecified: Secondary | ICD-10-CM | POA: Diagnosis not present

## 2020-08-17 DIAGNOSIS — J90 Pleural effusion, not elsewhere classified: Secondary | ICD-10-CM | POA: Diagnosis not present

## 2020-08-17 DIAGNOSIS — I5032 Chronic diastolic (congestive) heart failure: Secondary | ICD-10-CM | POA: Diagnosis not present

## 2020-08-18 DIAGNOSIS — C801 Malignant (primary) neoplasm, unspecified: Secondary | ICD-10-CM | POA: Diagnosis not present

## 2020-08-18 DIAGNOSIS — I471 Supraventricular tachycardia: Secondary | ICD-10-CM | POA: Diagnosis not present

## 2020-08-18 DIAGNOSIS — N185 Chronic kidney disease, stage 5: Secondary | ICD-10-CM | POA: Diagnosis not present

## 2020-08-18 DIAGNOSIS — R2681 Unsteadiness on feet: Secondary | ICD-10-CM | POA: Diagnosis not present

## 2020-08-18 DIAGNOSIS — M1990 Unspecified osteoarthritis, unspecified site: Secondary | ICD-10-CM | POA: Diagnosis not present

## 2020-08-18 DIAGNOSIS — I5031 Acute diastolic (congestive) heart failure: Secondary | ICD-10-CM | POA: Diagnosis not present

## 2020-08-18 DIAGNOSIS — C799 Secondary malignant neoplasm of unspecified site: Secondary | ICD-10-CM | POA: Diagnosis not present

## 2020-08-18 DIAGNOSIS — J90 Pleural effusion, not elsewhere classified: Secondary | ICD-10-CM | POA: Diagnosis not present

## 2020-08-18 DIAGNOSIS — F39 Unspecified mood [affective] disorder: Secondary | ICD-10-CM | POA: Diagnosis not present

## 2020-08-19 DIAGNOSIS — M1990 Unspecified osteoarthritis, unspecified site: Secondary | ICD-10-CM | POA: Diagnosis not present

## 2020-08-19 DIAGNOSIS — C801 Malignant (primary) neoplasm, unspecified: Secondary | ICD-10-CM | POA: Diagnosis not present

## 2020-08-19 DIAGNOSIS — R2681 Unsteadiness on feet: Secondary | ICD-10-CM | POA: Diagnosis not present

## 2020-08-19 DIAGNOSIS — J90 Pleural effusion, not elsewhere classified: Secondary | ICD-10-CM | POA: Diagnosis not present

## 2020-08-20 DIAGNOSIS — C801 Malignant (primary) neoplasm, unspecified: Secondary | ICD-10-CM | POA: Diagnosis not present

## 2020-08-20 DIAGNOSIS — R2681 Unsteadiness on feet: Secondary | ICD-10-CM | POA: Diagnosis not present

## 2020-08-20 DIAGNOSIS — M1990 Unspecified osteoarthritis, unspecified site: Secondary | ICD-10-CM | POA: Diagnosis not present

## 2020-08-20 DIAGNOSIS — J90 Pleural effusion, not elsewhere classified: Secondary | ICD-10-CM | POA: Diagnosis not present

## 2020-08-21 DIAGNOSIS — R2681 Unsteadiness on feet: Secondary | ICD-10-CM | POA: Diagnosis not present

## 2020-08-21 DIAGNOSIS — M1990 Unspecified osteoarthritis, unspecified site: Secondary | ICD-10-CM | POA: Diagnosis not present

## 2020-08-21 DIAGNOSIS — J90 Pleural effusion, not elsewhere classified: Secondary | ICD-10-CM | POA: Diagnosis not present

## 2020-08-21 DIAGNOSIS — C801 Malignant (primary) neoplasm, unspecified: Secondary | ICD-10-CM | POA: Diagnosis not present

## 2020-08-24 DIAGNOSIS — M1990 Unspecified osteoarthritis, unspecified site: Secondary | ICD-10-CM | POA: Diagnosis not present

## 2020-08-24 DIAGNOSIS — R2681 Unsteadiness on feet: Secondary | ICD-10-CM | POA: Diagnosis not present

## 2020-08-24 DIAGNOSIS — J90 Pleural effusion, not elsewhere classified: Secondary | ICD-10-CM | POA: Diagnosis not present

## 2020-08-24 DIAGNOSIS — C801 Malignant (primary) neoplasm, unspecified: Secondary | ICD-10-CM | POA: Diagnosis not present

## 2020-08-25 DIAGNOSIS — K5909 Other constipation: Secondary | ICD-10-CM | POA: Diagnosis not present

## 2020-08-25 DIAGNOSIS — M1990 Unspecified osteoarthritis, unspecified site: Secondary | ICD-10-CM | POA: Diagnosis not present

## 2020-08-25 DIAGNOSIS — R2681 Unsteadiness on feet: Secondary | ICD-10-CM | POA: Diagnosis not present

## 2020-08-25 DIAGNOSIS — C801 Malignant (primary) neoplasm, unspecified: Secondary | ICD-10-CM | POA: Diagnosis not present

## 2020-08-25 DIAGNOSIS — J90 Pleural effusion, not elsewhere classified: Secondary | ICD-10-CM | POA: Diagnosis not present

## 2020-08-26 DIAGNOSIS — J90 Pleural effusion, not elsewhere classified: Secondary | ICD-10-CM | POA: Diagnosis not present

## 2020-08-26 DIAGNOSIS — M1990 Unspecified osteoarthritis, unspecified site: Secondary | ICD-10-CM | POA: Diagnosis not present

## 2020-08-26 DIAGNOSIS — C801 Malignant (primary) neoplasm, unspecified: Secondary | ICD-10-CM | POA: Diagnosis not present

## 2020-08-26 DIAGNOSIS — R2681 Unsteadiness on feet: Secondary | ICD-10-CM | POA: Diagnosis not present

## 2020-08-27 DIAGNOSIS — C801 Malignant (primary) neoplasm, unspecified: Secondary | ICD-10-CM | POA: Diagnosis not present

## 2020-08-27 DIAGNOSIS — R2681 Unsteadiness on feet: Secondary | ICD-10-CM | POA: Diagnosis not present

## 2020-08-27 DIAGNOSIS — J90 Pleural effusion, not elsewhere classified: Secondary | ICD-10-CM | POA: Diagnosis not present

## 2020-08-27 DIAGNOSIS — M1990 Unspecified osteoarthritis, unspecified site: Secondary | ICD-10-CM | POA: Diagnosis not present

## 2020-08-28 DIAGNOSIS — R2681 Unsteadiness on feet: Secondary | ICD-10-CM | POA: Diagnosis not present

## 2020-08-28 DIAGNOSIS — J90 Pleural effusion, not elsewhere classified: Secondary | ICD-10-CM | POA: Diagnosis not present

## 2020-08-28 DIAGNOSIS — C801 Malignant (primary) neoplasm, unspecified: Secondary | ICD-10-CM | POA: Diagnosis not present

## 2020-08-28 DIAGNOSIS — M1990 Unspecified osteoarthritis, unspecified site: Secondary | ICD-10-CM | POA: Diagnosis not present

## 2020-08-31 DIAGNOSIS — R2681 Unsteadiness on feet: Secondary | ICD-10-CM | POA: Diagnosis not present

## 2020-08-31 DIAGNOSIS — C801 Malignant (primary) neoplasm, unspecified: Secondary | ICD-10-CM | POA: Diagnosis not present

## 2020-08-31 DIAGNOSIS — J90 Pleural effusion, not elsewhere classified: Secondary | ICD-10-CM | POA: Diagnosis not present

## 2020-08-31 DIAGNOSIS — M1990 Unspecified osteoarthritis, unspecified site: Secondary | ICD-10-CM | POA: Diagnosis not present

## 2020-09-01 DIAGNOSIS — R1310 Dysphagia, unspecified: Secondary | ICD-10-CM | POA: Diagnosis not present

## 2020-09-01 DIAGNOSIS — R188 Other ascites: Secondary | ICD-10-CM | POA: Diagnosis not present

## 2020-09-01 DIAGNOSIS — R634 Abnormal weight loss: Secondary | ICD-10-CM | POA: Diagnosis not present

## 2020-09-01 DIAGNOSIS — Z87891 Personal history of nicotine dependence: Secondary | ICD-10-CM | POA: Diagnosis not present

## 2020-09-01 DIAGNOSIS — M810 Age-related osteoporosis without current pathological fracture: Secondary | ICD-10-CM | POA: Diagnosis not present

## 2020-09-01 DIAGNOSIS — C380 Malignant neoplasm of heart: Secondary | ICD-10-CM | POA: Diagnosis not present

## 2020-09-01 DIAGNOSIS — E669 Obesity, unspecified: Secondary | ICD-10-CM | POA: Diagnosis not present

## 2020-09-01 DIAGNOSIS — J90 Pleural effusion, not elsewhere classified: Secondary | ICD-10-CM | POA: Diagnosis not present

## 2020-09-01 DIAGNOSIS — F32A Depression, unspecified: Secondary | ICD-10-CM | POA: Diagnosis not present

## 2020-09-01 DIAGNOSIS — R600 Localized edema: Secondary | ICD-10-CM | POA: Diagnosis not present

## 2020-09-01 DIAGNOSIS — N185 Chronic kidney disease, stage 5: Secondary | ICD-10-CM | POA: Diagnosis not present

## 2020-09-01 DIAGNOSIS — D630 Anemia in neoplastic disease: Secondary | ICD-10-CM | POA: Diagnosis not present

## 2020-09-01 DIAGNOSIS — Z87310 Personal history of (healed) osteoporosis fracture: Secondary | ICD-10-CM | POA: Diagnosis not present

## 2020-09-01 DIAGNOSIS — M1990 Unspecified osteoarthritis, unspecified site: Secondary | ICD-10-CM | POA: Diagnosis not present

## 2020-09-01 DIAGNOSIS — I120 Hypertensive chronic kidney disease with stage 5 chronic kidney disease or end stage renal disease: Secondary | ICD-10-CM | POA: Diagnosis not present

## 2020-09-01 DIAGNOSIS — J302 Other seasonal allergic rhinitis: Secondary | ICD-10-CM | POA: Diagnosis not present

## 2020-09-01 DIAGNOSIS — C7800 Secondary malignant neoplasm of unspecified lung: Secondary | ICD-10-CM | POA: Diagnosis not present

## 2020-09-01 DIAGNOSIS — Z9981 Dependence on supplemental oxygen: Secondary | ICD-10-CM | POA: Diagnosis not present

## 2020-09-01 DIAGNOSIS — K219 Gastro-esophageal reflux disease without esophagitis: Secondary | ICD-10-CM | POA: Diagnosis not present

## 2020-09-01 DIAGNOSIS — Z6830 Body mass index (BMI) 30.0-30.9, adult: Secondary | ICD-10-CM | POA: Diagnosis not present

## 2020-09-01 DIAGNOSIS — F419 Anxiety disorder, unspecified: Secondary | ICD-10-CM | POA: Diagnosis not present

## 2020-09-01 DIAGNOSIS — R34 Anuria and oliguria: Secondary | ICD-10-CM | POA: Diagnosis not present

## 2020-09-01 DIAGNOSIS — K767 Hepatorenal syndrome: Secondary | ICD-10-CM | POA: Diagnosis not present

## 2020-09-02 DIAGNOSIS — R188 Other ascites: Secondary | ICD-10-CM | POA: Diagnosis not present

## 2020-09-02 DIAGNOSIS — C7800 Secondary malignant neoplasm of unspecified lung: Secondary | ICD-10-CM | POA: Diagnosis not present

## 2020-09-02 DIAGNOSIS — F419 Anxiety disorder, unspecified: Secondary | ICD-10-CM | POA: Diagnosis not present

## 2020-09-02 DIAGNOSIS — F32A Depression, unspecified: Secondary | ICD-10-CM | POA: Diagnosis not present

## 2020-09-02 DIAGNOSIS — J90 Pleural effusion, not elsewhere classified: Secondary | ICD-10-CM | POA: Diagnosis not present

## 2020-09-02 DIAGNOSIS — C380 Malignant neoplasm of heart: Secondary | ICD-10-CM | POA: Diagnosis not present

## 2020-09-03 DIAGNOSIS — C380 Malignant neoplasm of heart: Secondary | ICD-10-CM | POA: Diagnosis not present

## 2020-09-03 DIAGNOSIS — C7800 Secondary malignant neoplasm of unspecified lung: Secondary | ICD-10-CM | POA: Diagnosis not present

## 2020-09-03 DIAGNOSIS — F32A Depression, unspecified: Secondary | ICD-10-CM | POA: Diagnosis not present

## 2020-09-03 DIAGNOSIS — J9601 Acute respiratory failure with hypoxia: Secondary | ICD-10-CM | POA: Diagnosis not present

## 2020-09-03 DIAGNOSIS — F419 Anxiety disorder, unspecified: Secondary | ICD-10-CM | POA: Diagnosis not present

## 2020-09-03 DIAGNOSIS — R188 Other ascites: Secondary | ICD-10-CM | POA: Diagnosis not present

## 2020-09-03 DIAGNOSIS — J90 Pleural effusion, not elsewhere classified: Secondary | ICD-10-CM | POA: Diagnosis not present

## 2020-09-07 DIAGNOSIS — R188 Other ascites: Secondary | ICD-10-CM | POA: Diagnosis not present

## 2020-09-07 DIAGNOSIS — F419 Anxiety disorder, unspecified: Secondary | ICD-10-CM | POA: Diagnosis not present

## 2020-09-07 DIAGNOSIS — C380 Malignant neoplasm of heart: Secondary | ICD-10-CM | POA: Diagnosis not present

## 2020-09-07 DIAGNOSIS — F32A Depression, unspecified: Secondary | ICD-10-CM | POA: Diagnosis not present

## 2020-09-07 DIAGNOSIS — J90 Pleural effusion, not elsewhere classified: Secondary | ICD-10-CM | POA: Diagnosis not present

## 2020-09-07 DIAGNOSIS — C799 Secondary malignant neoplasm of unspecified site: Secondary | ICD-10-CM | POA: Diagnosis not present

## 2020-09-07 DIAGNOSIS — C7800 Secondary malignant neoplasm of unspecified lung: Secondary | ICD-10-CM | POA: Diagnosis not present

## 2020-09-08 DEATH — deceased

## 2020-09-14 ENCOUNTER — Telehealth: Payer: Self-pay | Admitting: Internal Medicine

## 2020-09-14 NOTE — Telephone Encounter (Signed)
Dee with [employer] called stating a date was wrong on the recent death certificate for patient. 08/07/20 was placed instead of 2020/09/13. Stated the health department should have reached out and sent it back to PCP within [place] system. Did express to them physician is currently out of office but will send message, as family is anxious to have done. Please advise.

## 2020-09-15 NOTE — Telephone Encounter (Signed)
Noted, we are working with [facility] and the state to get this fixed.  They are in communication with Dr. Silvio Pate remotely to resolve.

## 2020-09-15 NOTE — Telephone Encounter (Signed)
It actually states 10/05/20  They put it back on the [facility] system but it is locked---and I cannot change the date. Please see if they can fix that

## 2021-01-13 IMAGING — CR CHEST - 2 VIEW
1 series · 2 of 2 positions shown · non-contrast
Comparison: 06/01/2017
COMPARISON: 06/01/2017

Addendum:
CLINICAL DATA: Chest pain

EXAM:
CHEST - 2 VIEW

[Series 1: dg chest 2 view · 0.14mm/px · 2 of 2 slices shown]
[im 1/2]
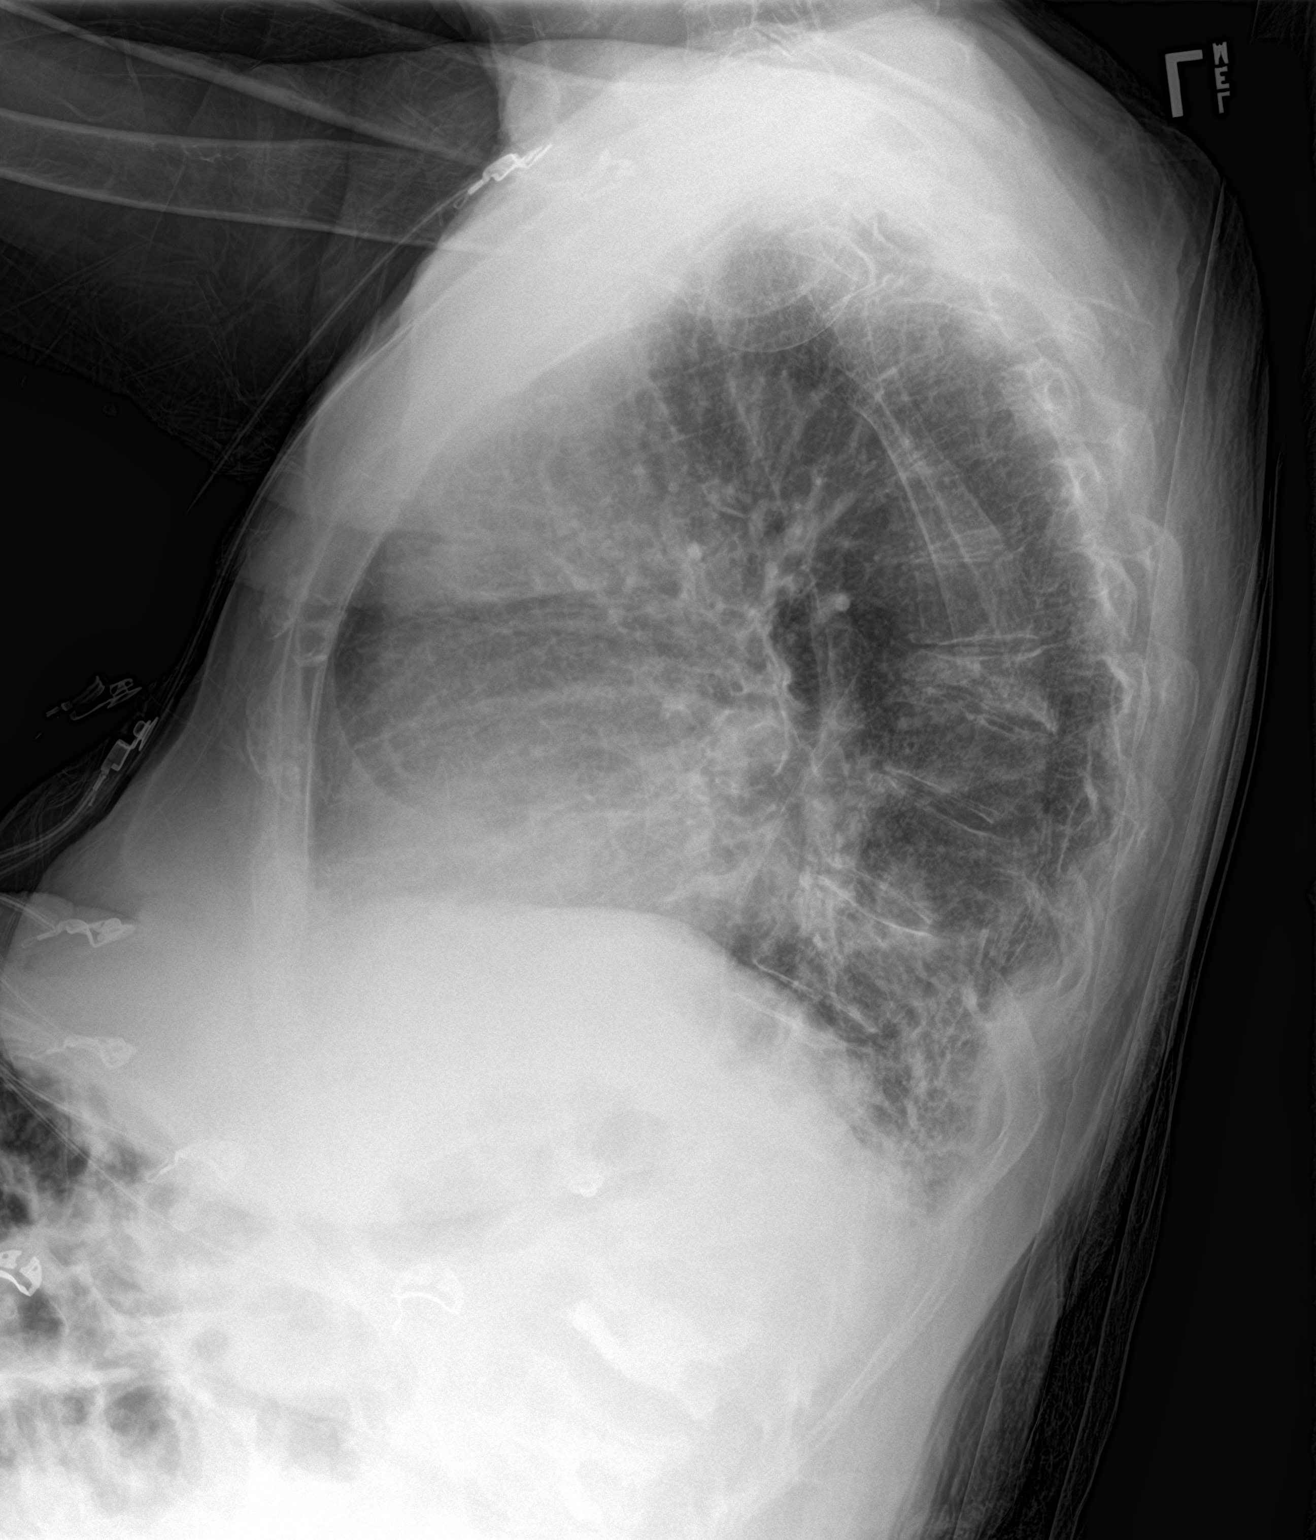
[im 2/2]
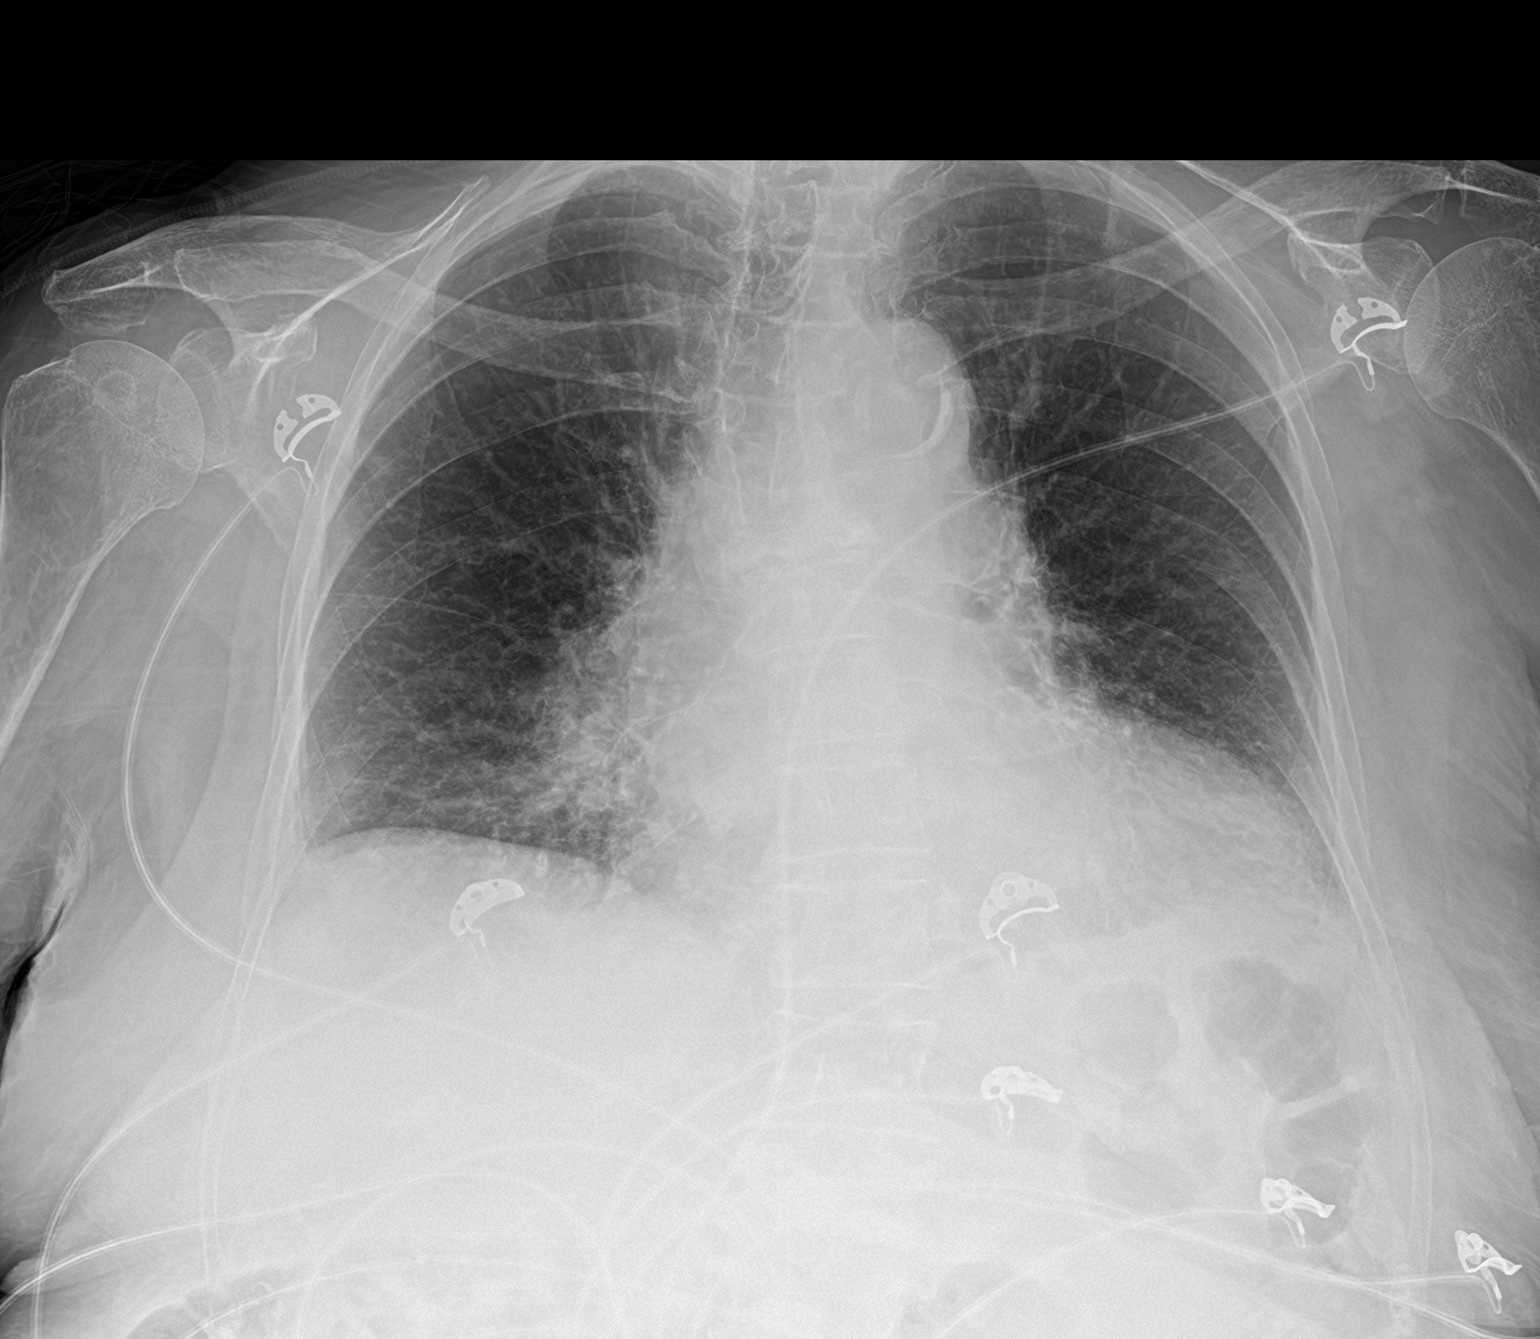

[2 of 2 positions shown; findings below may reference images not displayed]

FINDINGS: There is mild bilateral interstitial thickening. There is hazy left
lower lobe airspace disease concerning for pneumonia. There is no
pleural effusion or pneumothorax. There is stable cardiomegaly.

There is no acute osseous abnormality.
IMPRESSION: 1. Hazy left lower lobe airspace disease concerning for pneumonia.
Followup PA and lateral chest X-ray is recommended in 3-4 weeks
following trial of antibiotic therapy to ensure resolution and
exclude underlying malignancy.

ADDENDUM:
Age-indeterminate T7 vertebral body compression fracture.

*** End of Addendum ***
FINDINGS: There is mild bilateral interstitial thickening. There is hazy left
lower lobe airspace disease concerning for pneumonia. There is no
pleural effusion or pneumothorax. There is stable cardiomegaly.

There is no acute osseous abnormality.
IMPRESSION: 1. Hazy left lower lobe airspace disease concerning for pneumonia.
Followup PA and lateral chest X-ray is recommended in 3-4 weeks
following trial of antibiotic therapy to ensure resolution and
exclude underlying malignancy.

## 2021-01-24 IMAGING — DX PORTABLE CHEST - 1 VIEW
1 series · 1 of 1 positions shown · non-contrast
Comparison: CT PE study dated January 04, 2019

CLINICAL DATA: Status post pericardiocentesis

EXAM:
PORTABLE CHEST 1 VIEW

[chest ap]
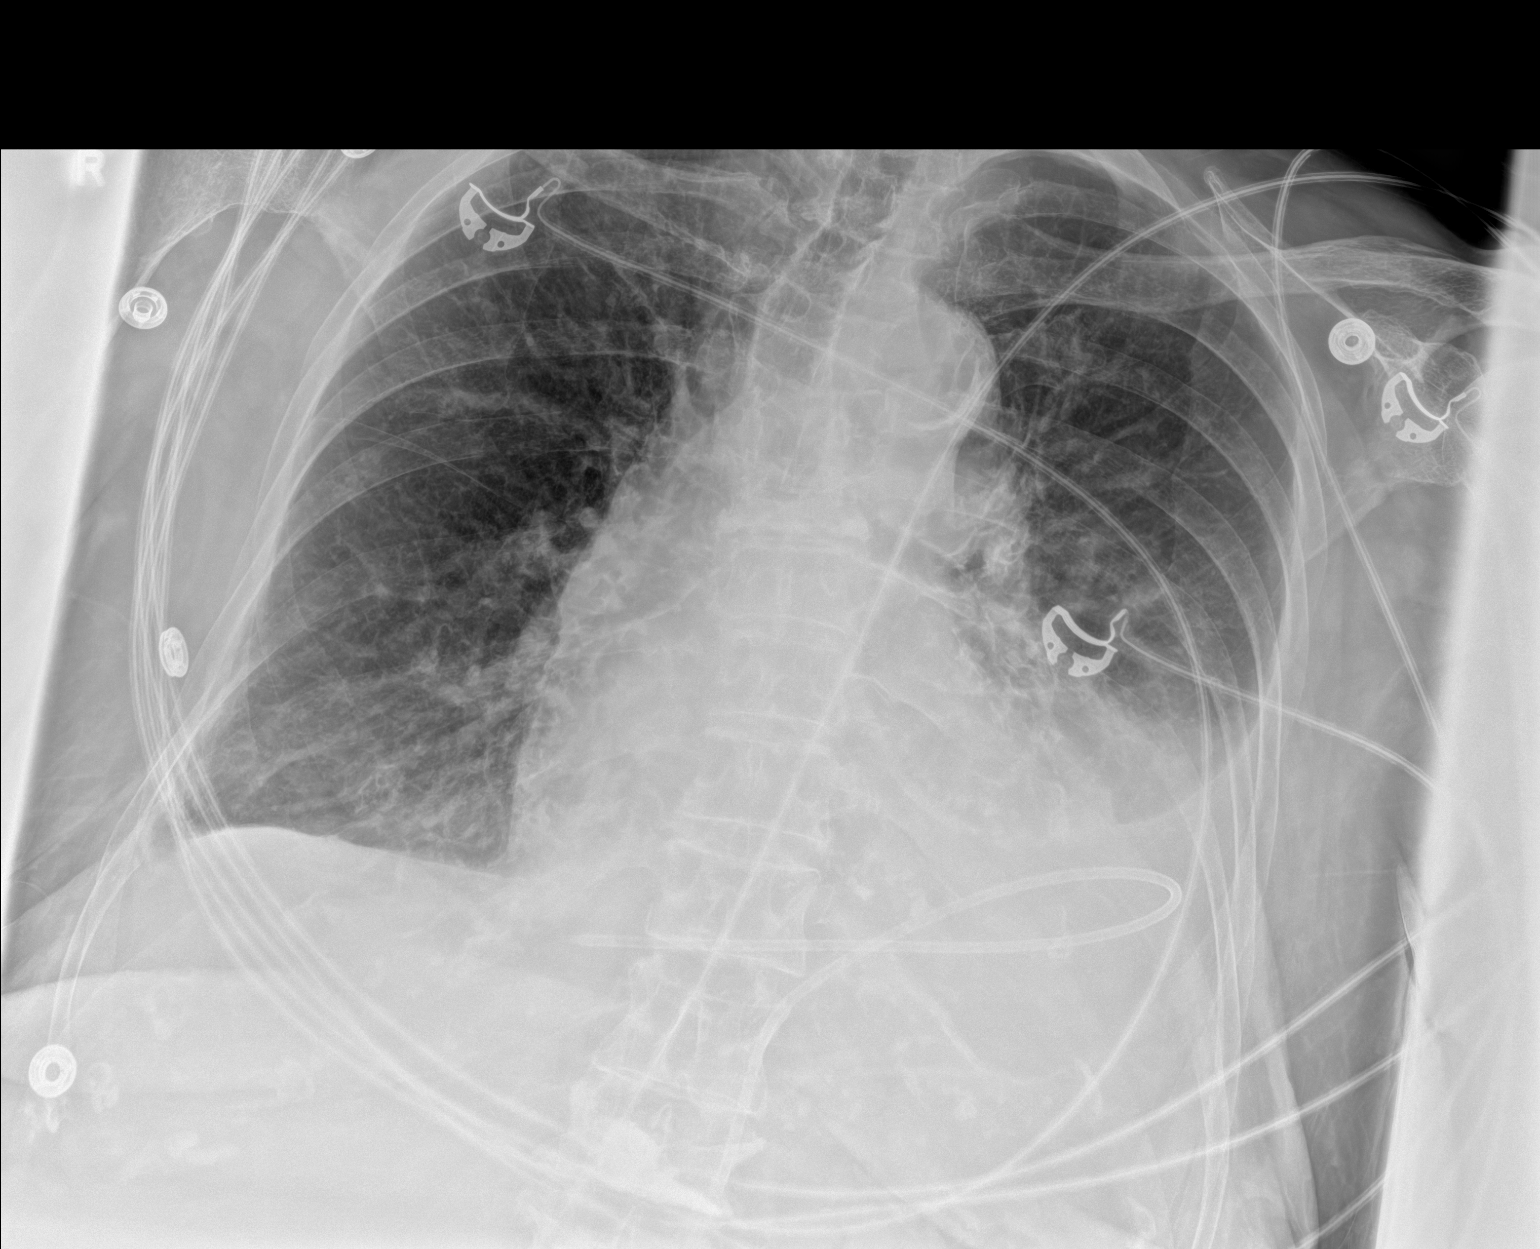

[1 of 1 positions shown; findings below may reference images not displayed]

FINDINGS: The heart size appears stable from chest x-ray dated December 24, 2018
but is likely improved when compared to CT dated January 04, 2019. A
pericardial drain is now noted projecting over the heart. There is a
moderate-sized left-sided pleural effusion. There is a small
right-sided pleural effusion. There is atelectasis at the left lung
base. There is mild volume overload without overt pulmonary edema.
No pneumothorax.

A T7 compression fracture is again noted. Aortic calcifications are
noted.
IMPRESSION: 1. New pericardial drain is noted projecting over the lower heart
border.
2. Moderate-sized left-sided pleural effusion. There is a small
right-sided pleural effusion.
3. No pneumothorax.

## 2021-01-26 IMAGING — DX CHEST  1 VIEW
1 series · 1 of 1 positions shown · non-contrast
Comparison: Chest x-rays dated 01/04/2019, 12/24/2018 and
06/01/2017.

CLINICAL DATA: Shortness of breath and weakness this morning.

EXAM:
CHEST  1 VIEW

[chest ap]
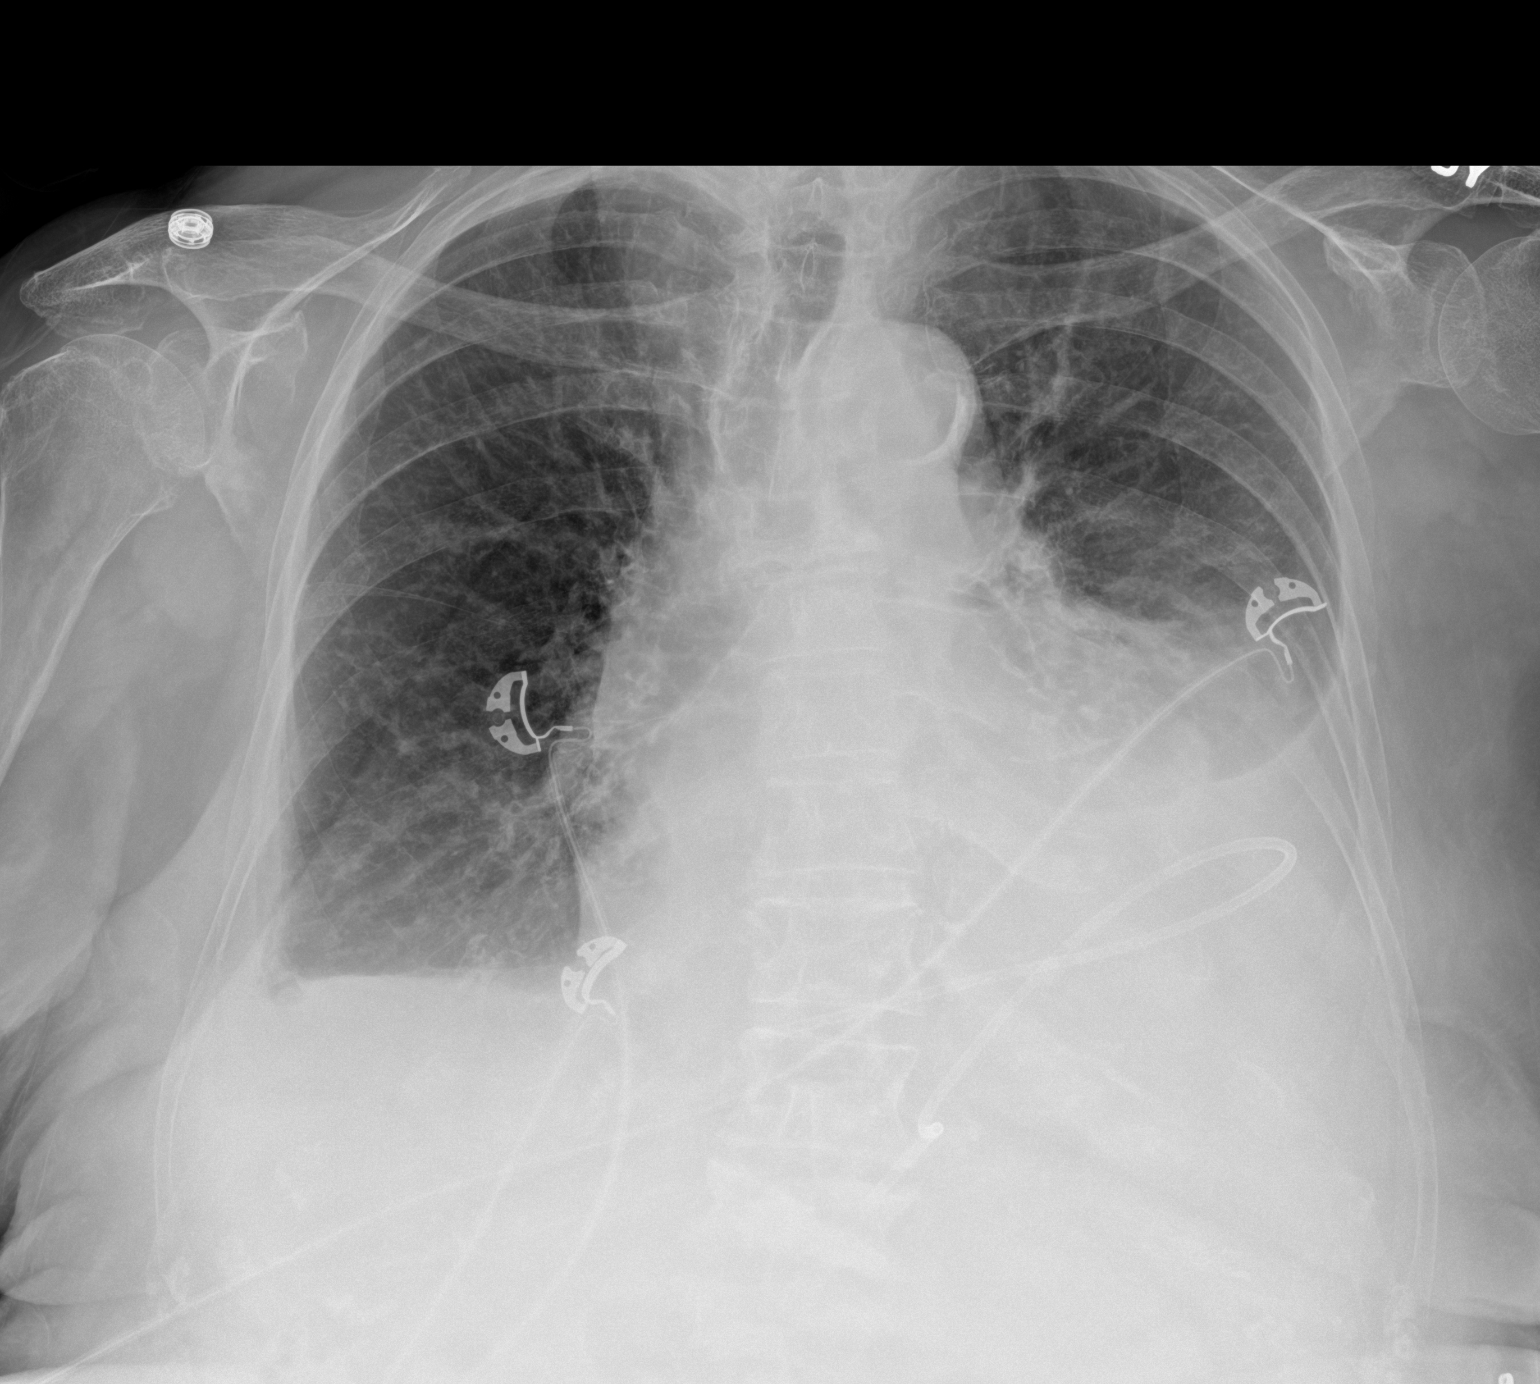

[1 of 1 positions shown; findings below may reference images not displayed]

FINDINGS: Stable cardiomegaly. Moderate-sized LEFT pleural effusion appears
stable. Pericardial drain remains in place with tip at the midline.

Small stable RIGHT-sided pleural effusion. There is mild central
pulmonary vascular congestion without evidence of overt alveolar
pulmonary edema, likely superimposed on some degree of chronic
bronchitic change and/or chronic interstitial lung disease.
IMPRESSION: 1. Cardiomegaly with central pulmonary vascular congestion
suggesting mild CHF/volume overload, likely superimposed on chronic
bronchitic change and/or chronic interstitial lung disease.
2. Stable bilateral pleural effusions, moderate on the LEFT and
small on the RIGHT.
3. Pericardial drain remains in place with tip at the midline.

## 2021-01-29 IMAGING — US US THORACENTESIS ASP PLEURAL SPACE W/IMG GUIDE
1 series · 4 of 4 positions shown · non-contrast
Comparison: none

INDICATION: Left pleural effusion

[Series 1: us thoracentesis asp pleural space w/img guide · 0.21mm/px · 4 of 4 slices shown]
[im 1/4]
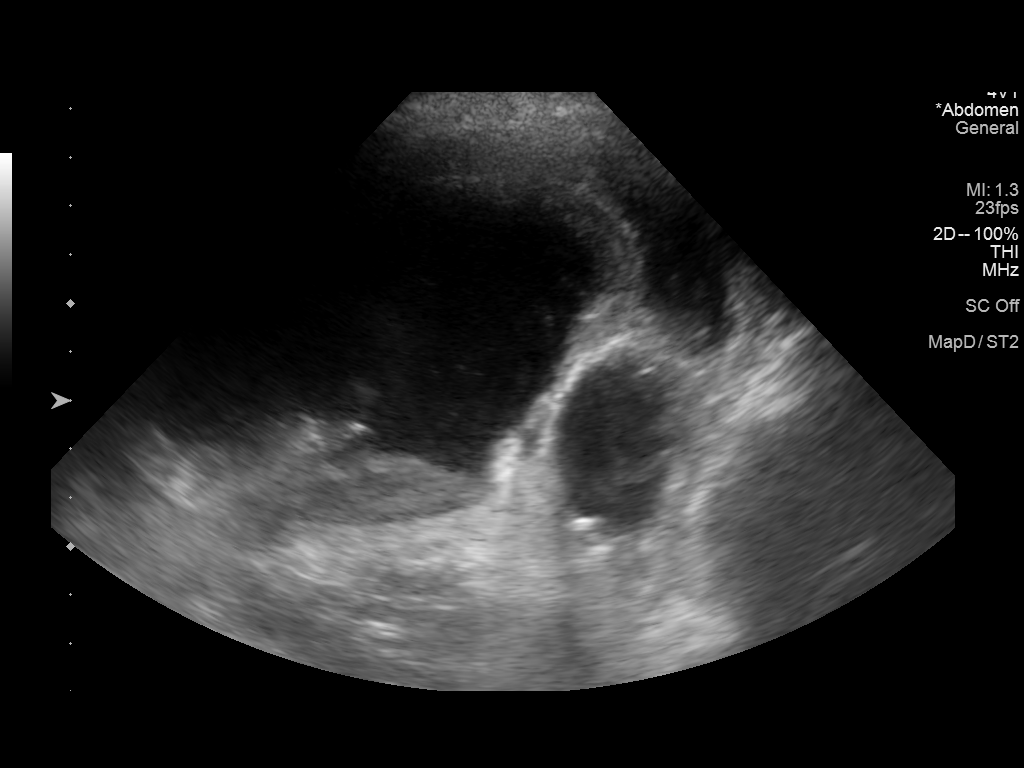
[im 2/4]
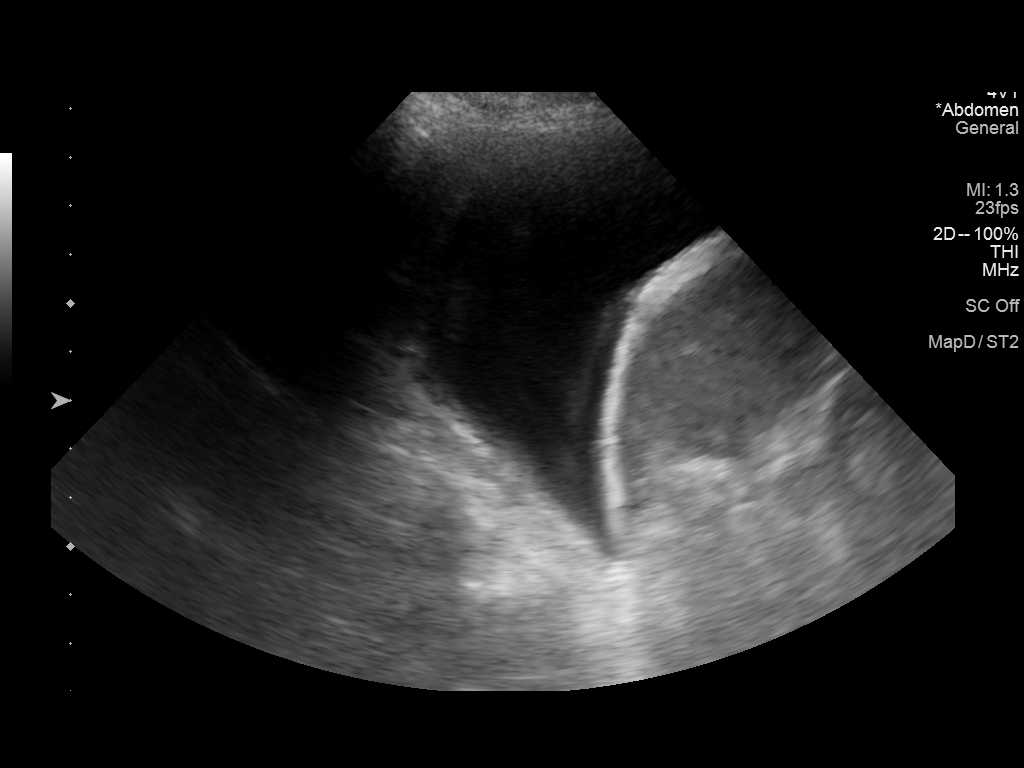
[im 3/4]
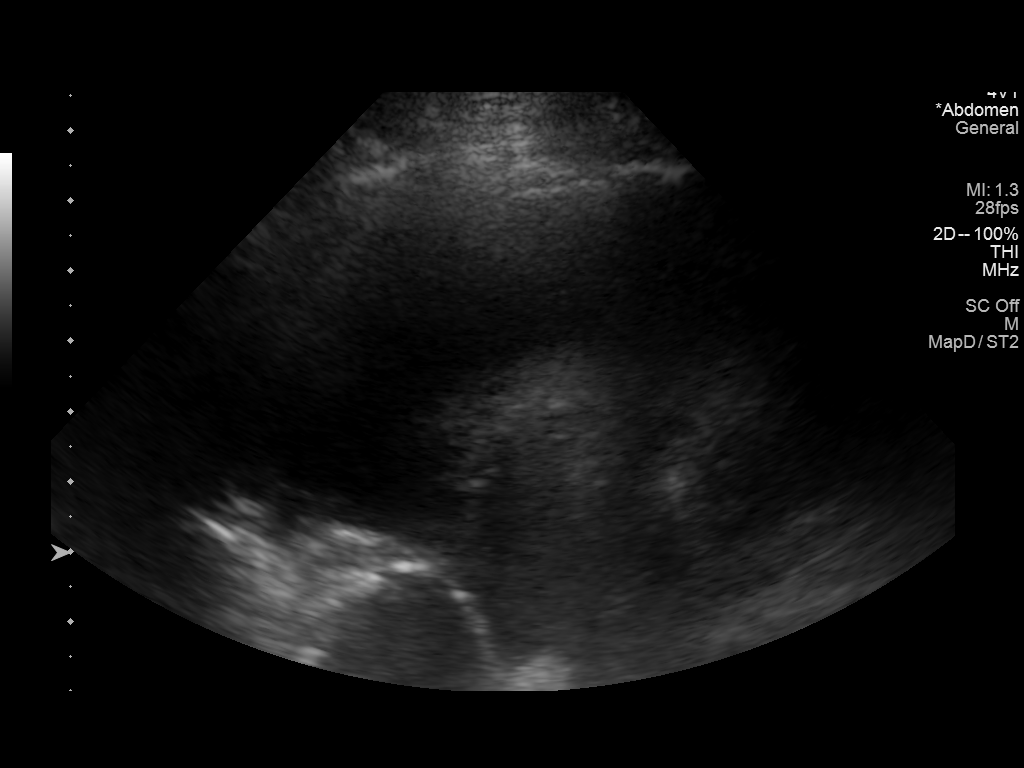
[im 4/4]
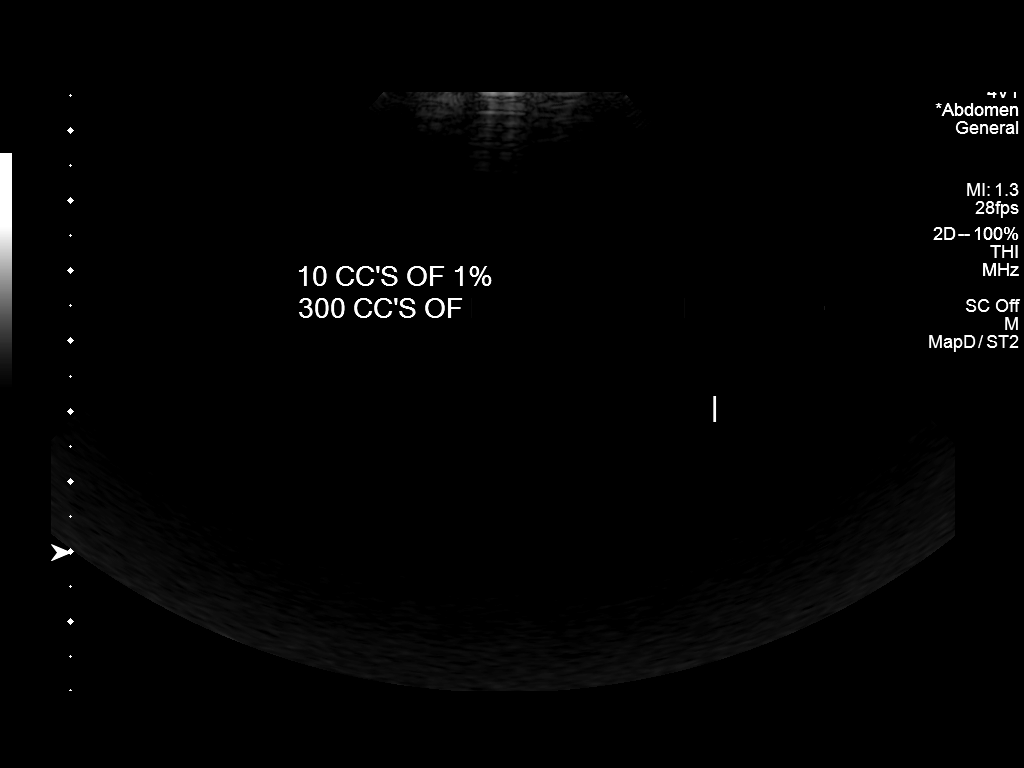

[4 of 4 positions shown; findings below may reference images not displayed]

EXAM:
ULTRASOUND GUIDED LEFT THORACENTESIS

MEDICATIONS:
1%

COMPLICATIONS:
None immediate.

PROCEDURE:
An ultrasound guided thoracentesis was thoroughly discussed with the
patient and questions answered. The benefits, risks, alternatives
and complications were also discussed. The patient understands and
wishes to proceed with the procedure. Written consent was obtained.

Ultrasound was performed to localize and mark an adequate pocket of
fluid in the left chest. The area was then prepped and draped in the
normal sterile fashion. 1% Lidocaine was used for local anesthesia.
Under ultrasound guidance a 6 Fr Safe-T-Centesis catheter was
introduced. Thoracentesis was performed. The catheter was removed
and a dressing applied.
FINDINGS: A total of approximately 300 cc of bloody pleural fluid was removed.
Samples were sent to the laboratory as requested by the clinical
team.
IMPRESSION: Successful ultrasound guided left thoracentesis yielding 300 cc of
pleural fluid.
